# Patient Record
Sex: Female | Born: 1965 | Hispanic: Yes | Marital: Single | Smoking: Never smoker
Health system: Southern US, Community
[De-identification: ages and names within clinical notes are randomized; demographics above are authoritative.]

## PROBLEM LIST (undated history)

## (undated) ENCOUNTER — Emergency Department (HOSPITAL_COMMUNITY): Admission: EM

## (undated) ENCOUNTER — Ambulatory Visit (HOSPITAL_COMMUNITY): Source: Home / Self Care

## (undated) DIAGNOSIS — M329 Systemic lupus erythematosus, unspecified: Secondary | ICD-10-CM

## (undated) DIAGNOSIS — M1990 Unspecified osteoarthritis, unspecified site: Secondary | ICD-10-CM

## (undated) DIAGNOSIS — Z973 Presence of spectacles and contact lenses: Secondary | ICD-10-CM

## (undated) DIAGNOSIS — K219 Gastro-esophageal reflux disease without esophagitis: Secondary | ICD-10-CM

## (undated) DIAGNOSIS — N393 Stress incontinence (female) (male): Secondary | ICD-10-CM

## (undated) DIAGNOSIS — M3500 Sicca syndrome, unspecified: Secondary | ICD-10-CM

## (undated) DIAGNOSIS — M069 Rheumatoid arthritis, unspecified: Secondary | ICD-10-CM

## (undated) DIAGNOSIS — H04123 Dry eye syndrome of bilateral lacrimal glands: Secondary | ICD-10-CM

## (undated) DIAGNOSIS — R7611 Nonspecific reaction to tuberculin skin test without active tuberculosis: Secondary | ICD-10-CM

## (undated) DIAGNOSIS — Z8669 Personal history of other diseases of the nervous system and sense organs: Secondary | ICD-10-CM

## (undated) HISTORY — DX: Sjogren syndrome, unspecified: M35.00

## (undated) HISTORY — DX: Systemic lupus erythematosus, unspecified: M32.9

## (undated) HISTORY — DX: Rheumatoid arthritis, unspecified: M06.9

## (undated) HISTORY — DX: Unspecified osteoarthritis, unspecified site: M19.90

---

## 1991-01-23 HISTORY — PX: TYMPANOPLASTY: SHX33

## 1992-01-23 HISTORY — PX: CERVICAL CONIZATION W/BX: SHX1330

## 2004-10-13 ENCOUNTER — Emergency Department (HOSPITAL_COMMUNITY): Admission: EM | Admit: 2004-10-13 | Discharge: 2004-10-13 | Payer: Self-pay | Admitting: Emergency Medicine

## 2004-10-21 ENCOUNTER — Emergency Department (HOSPITAL_COMMUNITY): Admission: EM | Admit: 2004-10-21 | Discharge: 2004-10-21 | Payer: Self-pay | Admitting: Emergency Medicine

## 2004-10-29 ENCOUNTER — Encounter: Admission: RE | Admit: 2004-10-29 | Discharge: 2004-10-29 | Payer: Self-pay | Admitting: *Deleted

## 2005-01-22 HISTORY — PX: CARPAL TUNNEL RELEASE: SHX101

## 2005-01-22 HISTORY — PX: GLAUCOMA SURGERY: SHX656

## 2005-02-05 ENCOUNTER — Encounter: Payer: Self-pay | Admitting: Family Medicine

## 2005-02-05 LAB — CONVERTED CEMR LAB: HCV Ab: NEGATIVE

## 2005-02-19 ENCOUNTER — Emergency Department (HOSPITAL_COMMUNITY): Admission: EM | Admit: 2005-02-19 | Discharge: 2005-02-19 | Payer: Self-pay | Admitting: Emergency Medicine

## 2005-05-14 ENCOUNTER — Encounter: Admission: RE | Admit: 2005-05-14 | Discharge: 2005-08-12 | Payer: Self-pay | Admitting: Orthopedic Surgery

## 2005-08-13 ENCOUNTER — Encounter: Admission: RE | Admit: 2005-08-13 | Discharge: 2005-11-11 | Payer: Self-pay | Admitting: Orthopedic Surgery

## 2006-01-17 ENCOUNTER — Encounter: Admission: RE | Admit: 2006-01-17 | Discharge: 2006-01-17 | Payer: Self-pay | Admitting: Family Medicine

## 2006-05-12 ENCOUNTER — Emergency Department (HOSPITAL_COMMUNITY): Admission: EM | Admit: 2006-05-12 | Discharge: 2006-05-13 | Payer: Self-pay | Admitting: Emergency Medicine

## 2007-01-14 ENCOUNTER — Ambulatory Visit: Payer: Self-pay | Admitting: Sports Medicine

## 2007-01-23 DIAGNOSIS — D649 Anemia, unspecified: Secondary | ICD-10-CM

## 2007-01-23 HISTORY — DX: Anemia, unspecified: D64.9

## 2007-01-23 HISTORY — PX: TRIGGER FINGER RELEASE: SHX641

## 2007-01-27 ENCOUNTER — Ambulatory Visit: Payer: Self-pay | Admitting: Family Medicine

## 2007-01-27 LAB — CONVERTED CEMR LAB: Rapid Strep: POSITIVE

## 2007-02-20 ENCOUNTER — Encounter: Admission: RE | Admit: 2007-02-20 | Discharge: 2007-02-20 | Payer: Self-pay | Admitting: Family Medicine

## 2007-02-24 ENCOUNTER — Encounter (INDEPENDENT_AMBULATORY_CARE_PROVIDER_SITE_OTHER): Payer: Self-pay | Admitting: Family Medicine

## 2007-02-27 ENCOUNTER — Ambulatory Visit: Payer: Self-pay | Admitting: Family Medicine

## 2007-02-27 LAB — CONVERTED CEMR LAB: Rapid Strep: NEGATIVE

## 2007-04-09 ENCOUNTER — Ambulatory Visit: Payer: Self-pay | Admitting: Family Medicine

## 2007-04-09 DIAGNOSIS — M069 Rheumatoid arthritis, unspecified: Secondary | ICD-10-CM

## 2007-04-10 ENCOUNTER — Ambulatory Visit: Payer: Self-pay | Admitting: Family Medicine

## 2007-04-11 ENCOUNTER — Ambulatory Visit: Payer: Self-pay | Admitting: Family Medicine

## 2007-04-11 ENCOUNTER — Encounter (INDEPENDENT_AMBULATORY_CARE_PROVIDER_SITE_OTHER): Payer: Self-pay | Admitting: Family Medicine

## 2007-04-17 LAB — CONVERTED CEMR LAB
Cholesterol: 171 mg/dL (ref 0–200)
HDL: 67 mg/dL (ref >39)
Total CHOL/HDL Ratio: 2.6
Triglycerides: 76 mg/dL (ref <150)

## 2007-04-21 ENCOUNTER — Encounter (INDEPENDENT_AMBULATORY_CARE_PROVIDER_SITE_OTHER): Payer: Self-pay | Admitting: Family Medicine

## 2007-04-29 ENCOUNTER — Encounter (INDEPENDENT_AMBULATORY_CARE_PROVIDER_SITE_OTHER): Payer: Self-pay | Admitting: Family Medicine

## 2007-04-29 ENCOUNTER — Ambulatory Visit: Payer: Self-pay | Admitting: Family Medicine

## 2007-05-19 ENCOUNTER — Ambulatory Visit: Payer: Self-pay | Admitting: Family Medicine

## 2007-05-19 ENCOUNTER — Encounter (INDEPENDENT_AMBULATORY_CARE_PROVIDER_SITE_OTHER): Payer: Self-pay | Admitting: Family Medicine

## 2007-05-20 ENCOUNTER — Encounter (INDEPENDENT_AMBULATORY_CARE_PROVIDER_SITE_OTHER): Payer: Self-pay | Admitting: Family Medicine

## 2007-05-21 ENCOUNTER — Encounter: Admission: RE | Admit: 2007-05-21 | Discharge: 2007-05-21 | Payer: Self-pay | Admitting: Internal Medicine

## 2007-05-29 ENCOUNTER — Encounter: Admission: RE | Admit: 2007-05-29 | Discharge: 2007-05-29 | Payer: Self-pay | Admitting: Gastroenterology

## 2007-06-23 ENCOUNTER — Ambulatory Visit: Payer: Self-pay | Admitting: Family Medicine

## 2007-06-25 ENCOUNTER — Encounter (INDEPENDENT_AMBULATORY_CARE_PROVIDER_SITE_OTHER): Payer: Self-pay | Admitting: Family Medicine

## 2007-07-27 ENCOUNTER — Emergency Department (HOSPITAL_COMMUNITY): Admission: EM | Admit: 2007-07-27 | Discharge: 2007-07-27 | Payer: Self-pay | Admitting: Family Medicine

## 2007-08-08 ENCOUNTER — Encounter (INDEPENDENT_AMBULATORY_CARE_PROVIDER_SITE_OTHER): Payer: Self-pay | Admitting: Family Medicine

## 2007-08-15 ENCOUNTER — Encounter (INDEPENDENT_AMBULATORY_CARE_PROVIDER_SITE_OTHER): Payer: Self-pay | Admitting: Family Medicine

## 2007-10-17 ENCOUNTER — Ambulatory Visit: Payer: Self-pay | Admitting: Family Medicine

## 2007-10-17 ENCOUNTER — Encounter (INDEPENDENT_AMBULATORY_CARE_PROVIDER_SITE_OTHER): Payer: Self-pay | Admitting: Family Medicine

## 2007-10-23 ENCOUNTER — Telehealth (INDEPENDENT_AMBULATORY_CARE_PROVIDER_SITE_OTHER): Payer: Self-pay | Admitting: Family Medicine

## 2007-10-31 ENCOUNTER — Ambulatory Visit: Payer: Self-pay | Admitting: Family Medicine

## 2007-11-06 ENCOUNTER — Telehealth (INDEPENDENT_AMBULATORY_CARE_PROVIDER_SITE_OTHER): Payer: Self-pay | Admitting: Family Medicine

## 2007-11-07 ENCOUNTER — Telehealth: Payer: Self-pay | Admitting: *Deleted

## 2007-11-27 ENCOUNTER — Ambulatory Visit: Payer: Self-pay | Admitting: Family Medicine

## 2007-11-27 ENCOUNTER — Telehealth: Payer: Self-pay | Admitting: *Deleted

## 2007-12-04 ENCOUNTER — Encounter: Admission: RE | Admit: 2007-12-04 | Discharge: 2008-01-22 | Payer: Self-pay | Admitting: Orthopedic Surgery

## 2008-01-06 ENCOUNTER — Ambulatory Visit: Payer: Self-pay | Admitting: Family Medicine

## 2008-01-06 DIAGNOSIS — M3500 Sicca syndrome, unspecified: Secondary | ICD-10-CM | POA: Insufficient documentation

## 2008-03-09 ENCOUNTER — Encounter (INDEPENDENT_AMBULATORY_CARE_PROVIDER_SITE_OTHER): Payer: Self-pay | Admitting: Family Medicine

## 2008-03-09 ENCOUNTER — Ambulatory Visit: Payer: Self-pay | Admitting: Family Medicine

## 2008-03-09 DIAGNOSIS — R799 Abnormal finding of blood chemistry, unspecified: Secondary | ICD-10-CM | POA: Insufficient documentation

## 2008-03-10 LAB — CONVERTED CEMR LAB
Albumin: 4.6 g/dL (ref 3.5–5.2)
BUN: 15 mg/dL (ref 6–23)
CO2: 20 meq/L (ref 19–32)
Chloride: 106 meq/L (ref 96–112)
Creatinine, Ser: 0.65 mg/dL (ref 0.40–1.20)
Glucose, Bld: 121 mg/dL — ABNORMAL HIGH (ref 70–99)
Lymphocytes Relative: 33 % (ref 12–46)
Lymphs Abs: 2.1 10*3/uL (ref 0.7–4.0)
MCV: 80 fL (ref 78.0–100.0)
Monocytes Absolute: 0.6 10*3/uL (ref 0.1–1.0)
Neutro Abs: 3.8 10*3/uL (ref 1.7–7.7)
Neutrophils Relative %: 58 % (ref 43–77)
Platelets: 212 10*3/uL (ref 150–400)
RBC: 4.36 M/uL (ref 3.87–5.11)
RDW: 15.4 % (ref 11.5–15.5)
Sodium: 138 meq/L (ref 135–145)
Total Bilirubin: 0.2 mg/dL — ABNORMAL LOW (ref 0.3–1.2)

## 2008-03-12 ENCOUNTER — Encounter: Admission: RE | Admit: 2008-03-12 | Discharge: 2008-03-12 | Payer: Self-pay | Admitting: Family Medicine

## 2008-04-06 ENCOUNTER — Other Ambulatory Visit: Admission: RE | Admit: 2008-04-06 | Discharge: 2008-04-06 | Payer: Self-pay | Admitting: Family Medicine

## 2008-04-06 ENCOUNTER — Encounter (INDEPENDENT_AMBULATORY_CARE_PROVIDER_SITE_OTHER): Payer: Self-pay | Admitting: Family Medicine

## 2008-04-06 ENCOUNTER — Ambulatory Visit: Payer: Self-pay | Admitting: Family Medicine

## 2008-04-12 LAB — CONVERTED CEMR LAB
ALT: 20 units/L (ref 0–35)
AST: 18 units/L (ref 0–37)
Albumin: 4.3 g/dL (ref 3.5–5.2)
Alkaline Phosphatase: 69 units/L (ref 39–117)
BUN: 15 mg/dL (ref 6–23)
Basophils Absolute: 0 10*3/uL (ref 0.0–0.1)
Basophils Relative: 1 % (ref 0–1)
Chloride: 108 meq/L (ref 96–112)
Creatinine, Ser: 0.63 mg/dL (ref 0.40–1.20)
Eosinophils Absolute: 0.1 10*3/uL (ref 0.0–0.7)
Hemoglobin: 10.5 g/dL — ABNORMAL LOW (ref 12.0–15.0)
Lymphs Abs: 1.6 10*3/uL (ref 0.7–4.0)
MCV: 79.7 fL (ref 78.0–100.0)
Neutrophils Relative %: 58 % (ref 43–77)
Potassium: 4.5 meq/L (ref 3.5–5.3)
RBC: 4.28 M/uL (ref 3.87–5.11)
RDW: 15.9 % — ABNORMAL HIGH (ref 11.5–15.5)
Total Protein: 7.4 g/dL (ref 6.0–8.3)
WBC: 4.9 10*3/uL (ref 4.0–10.5)

## 2008-05-05 ENCOUNTER — Encounter (INDEPENDENT_AMBULATORY_CARE_PROVIDER_SITE_OTHER): Payer: Self-pay | Admitting: Family Medicine

## 2008-05-05 ENCOUNTER — Ambulatory Visit: Payer: Self-pay | Admitting: Family Medicine

## 2008-05-05 DIAGNOSIS — K0510 Chronic gingivitis, plaque induced: Secondary | ICD-10-CM | POA: Insufficient documentation

## 2008-05-07 LAB — CONVERTED CEMR LAB
MCHC: 31.8 g/dL (ref 30.0–36.0)
MCV: 79.6 fL (ref 78.0–100.0)
Platelets: 208 10*3/uL (ref 150–400)
RBC: 4.07 M/uL (ref 3.87–5.11)
WBC: 5.3 10*3/uL (ref 4.0–10.5)

## 2008-06-07 ENCOUNTER — Encounter (INDEPENDENT_AMBULATORY_CARE_PROVIDER_SITE_OTHER): Payer: Self-pay | Admitting: Family Medicine

## 2008-06-07 ENCOUNTER — Ambulatory Visit: Payer: Self-pay | Admitting: Family Medicine

## 2008-06-07 ENCOUNTER — Ambulatory Visit (HOSPITAL_COMMUNITY): Admission: RE | Admit: 2008-06-07 | Discharge: 2008-06-07 | Payer: Self-pay | Admitting: Family Medicine

## 2008-06-07 ENCOUNTER — Encounter: Payer: Self-pay | Admitting: *Deleted

## 2008-06-07 DIAGNOSIS — K055 Other periodontal diseases: Secondary | ICD-10-CM

## 2008-06-08 LAB — CONVERTED CEMR LAB
AST: 15 units/L (ref 0–37)
Calcium: 8.6 mg/dL (ref 8.4–10.5)
Chloride: 111 meq/L (ref 96–112)
Ferritin: 2 ng/mL — ABNORMAL LOW (ref 10–291)
INR: 1 (ref 0.0–1.5)
Iron: 19 ug/dL — ABNORMAL LOW (ref 42–145)
Potassium: 4.3 meq/L (ref 3.5–5.3)
Prothrombin Time: 13.4 s (ref 11.6–15.2)
RBC: 3.82 M/uL — ABNORMAL LOW (ref 3.87–5.11)
Saturation Ratios: 5 % — ABNORMAL LOW (ref 20–55)
Sodium: 141 meq/L (ref 135–145)
TIBC: 391 ug/dL (ref 250–470)
TSH: 1.639 microintl units/mL (ref 0.350–4.500)
UIBC: 372 ug/dL

## 2008-06-09 ENCOUNTER — Telehealth: Payer: Self-pay | Admitting: *Deleted

## 2008-07-07 ENCOUNTER — Ambulatory Visit: Payer: Self-pay | Admitting: Family Medicine

## 2008-07-07 ENCOUNTER — Encounter (INDEPENDENT_AMBULATORY_CARE_PROVIDER_SITE_OTHER): Payer: Self-pay | Admitting: Family Medicine

## 2008-07-08 LAB — CONVERTED CEMR LAB
BUN: 12 mg/dL (ref 6–23)
CO2: 23 meq/L (ref 19–32)
Calcium: 8.8 mg/dL (ref 8.4–10.5)
Chloride: 108 meq/L (ref 96–112)
Creatinine, Ser: 0.68 mg/dL (ref 0.40–1.20)
Glucose, Bld: 84 mg/dL (ref 70–99)
HCT: 36.8 % (ref 36.0–46.0)
Hemoglobin: 11.1 g/dL — ABNORMAL LOW (ref 12.0–15.0)
MCHC: 30.2 g/dL (ref 30.0–36.0)
Platelets: 191 10*3/uL (ref 150–400)
RBC: 4.29 M/uL (ref 3.87–5.11)
WBC: 4.6 10*3/uL (ref 4.0–10.5)

## 2008-07-27 ENCOUNTER — Telehealth: Payer: Self-pay | Admitting: *Deleted

## 2008-07-27 ENCOUNTER — Encounter: Payer: Self-pay | Admitting: Family Medicine

## 2008-08-03 ENCOUNTER — Ambulatory Visit: Payer: Self-pay | Admitting: Family Medicine

## 2008-08-03 ENCOUNTER — Ambulatory Visit (HOSPITAL_COMMUNITY): Admission: RE | Admit: 2008-08-03 | Discharge: 2008-08-03 | Payer: Self-pay | Admitting: Family Medicine

## 2008-08-03 ENCOUNTER — Encounter: Payer: Self-pay | Admitting: Family Medicine

## 2008-08-03 LAB — CONVERTED CEMR LAB
BUN: 16 mg/dL (ref 6–23)
CO2: 24 meq/L (ref 19–32)
Calcium: 9 mg/dL (ref 8.4–10.5)
Glucose, Bld: 89 mg/dL (ref 70–99)
MCHC: 32.8 g/dL (ref 30.0–36.0)
Potassium: 4.3 meq/L (ref 3.5–5.3)
WBC: 5.9 10*3/uL (ref 4.0–10.5)

## 2008-08-31 ENCOUNTER — Encounter: Admission: RE | Admit: 2008-08-31 | Discharge: 2008-10-21 | Payer: Self-pay | Admitting: Family Medicine

## 2008-09-06 ENCOUNTER — Encounter: Payer: Self-pay | Admitting: Family Medicine

## 2008-09-06 ENCOUNTER — Ambulatory Visit: Payer: Self-pay | Admitting: Family Medicine

## 2008-09-06 LAB — CONVERTED CEMR LAB
Albumin: 4.2 g/dL (ref 3.5–5.2)
Alkaline Phosphatase: 51 units/L (ref 39–117)
BUN: 15 mg/dL (ref 6–23)
Basophils Absolute: 0 10*3/uL (ref 0.0–0.1)
CO2: 19 meq/L (ref 19–32)
Calcium: 8.9 mg/dL (ref 8.4–10.5)
Chloride: 110 meq/L (ref 96–112)
Eosinophils Absolute: 0 10*3/uL (ref 0.0–0.7)
Glucose, Bld: 87 mg/dL (ref 70–99)
HCT: 39.7 % (ref 36.0–46.0)
Lymphs Abs: 1.8 10*3/uL (ref 0.7–4.0)
Neutro Abs: 2.9 10*3/uL (ref 1.7–7.7)
Neutrophils Relative %: 56 % (ref 43–77)
Platelets: 186 10*3/uL (ref 150–400)
Potassium: 4.6 meq/L (ref 3.5–5.3)
Sodium: 139 meq/L (ref 135–145)

## 2008-09-08 ENCOUNTER — Ambulatory Visit: Payer: Self-pay | Admitting: Family Medicine

## 2008-09-08 ENCOUNTER — Encounter: Payer: Self-pay | Admitting: Family Medicine

## 2008-09-08 LAB — CONVERTED CEMR LAB: Rapid Strep: NEGATIVE

## 2008-09-10 LAB — CONVERTED CEMR LAB: Streptococcus, Group A Screen (Direct): NEGATIVE

## 2008-10-07 ENCOUNTER — Ambulatory Visit: Payer: Self-pay | Admitting: Family Medicine

## 2008-10-07 ENCOUNTER — Encounter: Payer: Self-pay | Admitting: Family Medicine

## 2008-10-07 LAB — CONVERTED CEMR LAB
Eosinophils Absolute: 0.1 10*3/uL (ref 0.0–0.7)
Eosinophils Relative: 1 % (ref 0–5)
GC Probe Amp, Genital: NEGATIVE
HCT: 40.3 % (ref 36.0–46.0)
MCHC: 32.5 g/dL (ref 30.0–36.0)
MCV: 90.2 fL (ref 78.0–100.0)
Neutro Abs: 7.1 10*3/uL (ref 1.7–7.7)
Neutrophils Relative %: 69 % (ref 43–77)
Nitrite: NEGATIVE
Specific Gravity, Urine: 1.01
WBC Urine, dipstick: NEGATIVE

## 2008-10-22 ENCOUNTER — Ambulatory Visit: Payer: Self-pay | Admitting: Family Medicine

## 2008-11-05 ENCOUNTER — Ambulatory Visit: Payer: Self-pay | Admitting: Family Medicine

## 2008-11-05 ENCOUNTER — Encounter: Payer: Self-pay | Admitting: Family Medicine

## 2008-11-05 LAB — CONVERTED CEMR LAB
Alkaline Phosphatase: 50 units/L (ref 39–117)
Basophils Absolute: 0 10*3/uL (ref 0.0–0.1)
CO2: 22 meq/L (ref 19–32)
Creatinine, Ser: 0.68 mg/dL (ref 0.40–1.20)
Monocytes Absolute: 0.6 10*3/uL (ref 0.1–1.0)
Monocytes Relative: 9 % (ref 3–12)
Neutro Abs: 3.8 10*3/uL (ref 1.7–7.7)
Platelets: 194 10*3/uL (ref 150–400)
Potassium: 4.5 meq/L (ref 3.5–5.3)
RBC: 4.33 M/uL (ref 3.87–5.11)
Total Bilirubin: 0.4 mg/dL (ref 0.3–1.2)
Total Protein: 7.7 g/dL (ref 6.0–8.3)
WBC: 6.7 10*3/uL (ref 4.0–10.5)

## 2008-11-06 ENCOUNTER — Encounter: Payer: Self-pay | Admitting: Family Medicine

## 2008-12-20 ENCOUNTER — Telehealth: Payer: Self-pay | Admitting: *Deleted

## 2008-12-20 ENCOUNTER — Encounter: Payer: Self-pay | Admitting: Family Medicine

## 2009-01-06 ENCOUNTER — Ambulatory Visit: Payer: Self-pay | Admitting: Family Medicine

## 2009-01-06 ENCOUNTER — Encounter: Payer: Self-pay | Admitting: Family Medicine

## 2009-01-10 LAB — CONVERTED CEMR LAB
ALT: 16 units/L (ref 0–35)
AST: 18 units/L (ref 0–37)
Alkaline Phosphatase: 66 units/L (ref 39–117)
Basophils Absolute: 0 10*3/uL (ref 0.0–0.1)
Basophils Relative: 0 % (ref 0–1)
Calcium: 8.8 mg/dL (ref 8.4–10.5)
Eosinophils Relative: 1 % (ref 0–5)
HCT: 38.4 % (ref 36.0–46.0)
Hemoglobin: 13.2 g/dL (ref 12.0–15.0)
MCHC: 34.4 g/dL (ref 30.0–36.0)
MCV: 88.1 fL (ref 78.0–100.0)
Monocytes Absolute: 0.6 10*3/uL (ref 0.1–1.0)
Monocytes Relative: 9 % (ref 3–12)
Neutrophils Relative %: 62 % (ref 43–77)
RBC: 4.36 M/uL (ref 3.87–5.11)
Total Protein: 7.3 g/dL (ref 6.0–8.3)

## 2009-01-28 ENCOUNTER — Encounter: Payer: Self-pay | Admitting: Family Medicine

## 2009-03-10 ENCOUNTER — Telehealth: Payer: Self-pay | Admitting: Family Medicine

## 2009-03-15 ENCOUNTER — Encounter: Payer: Self-pay | Admitting: Family Medicine

## 2009-03-15 ENCOUNTER — Ambulatory Visit: Payer: Self-pay | Admitting: Family Medicine

## 2009-03-18 LAB — CONVERTED CEMR LAB
ALT: 13 units/L (ref 0–35)
CO2: 23 meq/L (ref 19–32)
Calcium: 8.9 mg/dL (ref 8.4–10.5)
Chloride: 107 meq/L (ref 96–112)
Glucose, Bld: 74 mg/dL (ref 70–99)
HCT: 38.5 % (ref 36.0–46.0)
MCV: 87.1 fL (ref 78.0–100.0)
Platelets: 197 10*3/uL (ref 150–400)
RBC: 4.42 M/uL (ref 3.87–5.11)
Sodium: 139 meq/L (ref 135–145)
Total Bilirubin: 0.3 mg/dL (ref 0.3–1.2)
Total Protein: 7.2 g/dL (ref 6.0–8.3)
WBC: 5.6 10*3/uL (ref 4.0–10.5)

## 2009-05-25 ENCOUNTER — Ambulatory Visit: Payer: Self-pay | Admitting: Family Medicine

## 2009-05-25 ENCOUNTER — Encounter: Payer: Self-pay | Admitting: Family Medicine

## 2009-05-25 LAB — CONVERTED CEMR LAB
ALT: 18 units/L (ref 0–35)
AST: 21 units/L (ref 0–37)
CO2: 22 meq/L (ref 19–32)
Calcium: 9.5 mg/dL (ref 8.4–10.5)
Chloride: 104 meq/L (ref 96–112)
Creatinine, Ser: 0.76 mg/dL (ref 0.40–1.20)
Lymphocytes Relative: 31 % (ref 12–46)
Lymphs Abs: 2.6 10*3/uL (ref 0.7–4.0)
Monocytes Relative: 6 % (ref 3–12)
Neutro Abs: 5 10*3/uL (ref 1.7–7.7)
Neutrophils Relative %: 61 % (ref 43–77)
Platelets: 234 10*3/uL (ref 150–400)
Potassium: 4.3 meq/L (ref 3.5–5.3)
RBC: 4.71 M/uL (ref 3.87–5.11)
Sodium: 138 meq/L (ref 135–145)
Total Protein: 7.9 g/dL (ref 6.0–8.3)
WBC: 8.2 10*3/uL (ref 4.0–10.5)

## 2009-05-26 ENCOUNTER — Encounter: Payer: Self-pay | Admitting: Family Medicine

## 2009-06-06 ENCOUNTER — Telehealth: Payer: Self-pay | Admitting: *Deleted

## 2009-07-14 ENCOUNTER — Encounter: Payer: Self-pay | Admitting: Family Medicine

## 2009-08-23 ENCOUNTER — Ambulatory Visit: Payer: Self-pay | Admitting: Family Medicine

## 2009-08-23 DIAGNOSIS — R300 Dysuria: Secondary | ICD-10-CM

## 2009-08-23 LAB — CONVERTED CEMR LAB
Bilirubin Urine: NEGATIVE
Blood in Urine, dipstick: NEGATIVE
Glucose, Urine, Semiquant: NEGATIVE
Nitrite: POSITIVE
Specific Gravity, Urine: 1.015

## 2009-09-07 ENCOUNTER — Encounter: Payer: Self-pay | Admitting: Family Medicine

## 2009-09-07 ENCOUNTER — Other Ambulatory Visit: Admission: RE | Admit: 2009-09-07 | Discharge: 2009-09-07 | Payer: Self-pay | Admitting: Family Medicine

## 2009-09-07 ENCOUNTER — Ambulatory Visit: Payer: Self-pay | Admitting: Family Medicine

## 2009-09-07 LAB — CONVERTED CEMR LAB
Bilirubin Urine: NEGATIVE
Chlamydia, DNA Probe: NEGATIVE
GC Probe Amp, Genital: NEGATIVE
Glucose, Urine, Semiquant: NEGATIVE
Ketones, urine, test strip: NEGATIVE
Protein, U semiquant: NEGATIVE
pH: 6.5

## 2009-09-08 ENCOUNTER — Encounter: Payer: Self-pay | Admitting: Family Medicine

## 2009-09-08 ENCOUNTER — Telehealth: Payer: Self-pay | Admitting: Family Medicine

## 2009-09-08 DIAGNOSIS — D509 Iron deficiency anemia, unspecified: Secondary | ICD-10-CM | POA: Insufficient documentation

## 2009-09-08 LAB — CONVERTED CEMR LAB
Albumin: 4.1 g/dL (ref 3.5–5.2)
CO2: 25 meq/L (ref 19–32)
Calcium: 9 mg/dL (ref 8.4–10.5)
Chloride: 105 meq/L (ref 96–112)
Glucose, Bld: 123 mg/dL — ABNORMAL HIGH (ref 70–99)
Lymphocytes Relative: 27 % (ref 12–46)
Lymphs Abs: 1.7 10*3/uL (ref 0.7–4.0)
Neutrophils Relative %: 61 % (ref 43–77)
Platelets: 217 10*3/uL (ref 150–400)
Potassium: 4.1 meq/L (ref 3.5–5.3)
RBC: 4.25 M/uL (ref 3.87–5.11)
Sodium: 140 meq/L (ref 135–145)
Total Protein: 7.1 g/dL (ref 6.0–8.3)
WBC: 6.3 10*3/uL (ref 4.0–10.5)

## 2009-09-09 ENCOUNTER — Telehealth: Payer: Self-pay | Admitting: *Deleted

## 2009-09-12 ENCOUNTER — Encounter: Payer: Self-pay | Admitting: Family Medicine

## 2009-09-15 ENCOUNTER — Emergency Department (HOSPITAL_COMMUNITY): Admission: EM | Admit: 2009-09-15 | Discharge: 2009-09-15 | Payer: Self-pay | Admitting: Emergency Medicine

## 2009-10-03 ENCOUNTER — Telehealth: Payer: Self-pay | Admitting: Family Medicine

## 2009-10-04 ENCOUNTER — Telehealth (INDEPENDENT_AMBULATORY_CARE_PROVIDER_SITE_OTHER): Payer: Self-pay | Admitting: *Deleted

## 2009-10-10 ENCOUNTER — Telehealth: Payer: Self-pay | Admitting: Family Medicine

## 2009-10-10 ENCOUNTER — Encounter: Payer: Self-pay | Admitting: Family Medicine

## 2009-10-10 ENCOUNTER — Ambulatory Visit: Payer: Self-pay | Admitting: Family Medicine

## 2009-10-11 ENCOUNTER — Telehealth: Payer: Self-pay | Admitting: *Deleted

## 2009-10-11 LAB — CONVERTED CEMR LAB
Basophils Absolute: 0 10*3/uL (ref 0.0–0.1)
Basophils Relative: 0 % (ref 0–1)
Hemoglobin: 12.4 g/dL (ref 12.0–15.0)
MCHC: 31.1 g/dL (ref 30.0–36.0)
Monocytes Absolute: 0.6 10*3/uL (ref 0.1–1.0)
Neutro Abs: 5.9 10*3/uL (ref 1.7–7.7)
Neutrophils Relative %: 72 % (ref 43–77)
RDW: 17.4 % — ABNORMAL HIGH (ref 11.5–15.5)

## 2009-10-12 ENCOUNTER — Ambulatory Visit (HOSPITAL_COMMUNITY): Admission: RE | Admit: 2009-10-12 | Discharge: 2009-10-12 | Payer: Self-pay | Admitting: Family Medicine

## 2009-10-24 ENCOUNTER — Encounter: Payer: Self-pay | Admitting: Family Medicine

## 2009-10-24 ENCOUNTER — Ambulatory Visit: Payer: Self-pay | Admitting: Family Medicine

## 2009-10-24 LAB — CONVERTED CEMR LAB
Ketones, urine, test strip: NEGATIVE
Nitrite: NEGATIVE
Specific Gravity, Urine: 1.02
WBC Urine, dipstick: NEGATIVE

## 2009-10-25 ENCOUNTER — Telehealth: Payer: Self-pay | Admitting: *Deleted

## 2009-10-25 ENCOUNTER — Encounter: Payer: Self-pay | Admitting: Family Medicine

## 2009-11-08 ENCOUNTER — Encounter: Payer: Self-pay | Admitting: Family Medicine

## 2009-11-10 ENCOUNTER — Ambulatory Visit: Payer: Self-pay | Admitting: Family Medicine

## 2009-11-10 ENCOUNTER — Encounter: Payer: Self-pay | Admitting: Family Medicine

## 2009-11-10 LAB — CONVERTED CEMR LAB
Bilirubin Urine: NEGATIVE
Blood in Urine, dipstick: NEGATIVE
Chlamydia, DNA Probe: NEGATIVE
Epithelial cells, urine: <5 /lpf
GC Probe Amp, Genital: NEGATIVE
Glucose, Urine, Semiquant: NEGATIVE
Ketones, urine, test strip: NEGATIVE
Nitrite: NEGATIVE
Protein, U semiquant: NEGATIVE
Specific Gravity, Urine: 1.01
Urobilinogen, UA: 0.2
WBC, UA: <5 cells/hpf
Whiff Test: NEGATIVE
pH: 6.5

## 2009-11-11 ENCOUNTER — Encounter: Payer: Self-pay | Admitting: Family Medicine

## 2009-11-14 ENCOUNTER — Encounter: Payer: Self-pay | Admitting: Family Medicine

## 2009-11-14 ENCOUNTER — Telehealth: Payer: Self-pay | Admitting: *Deleted

## 2009-11-14 LAB — CONVERTED CEMR LAB
Bilirubin Urine: NEGATIVE
Blood in Urine, dipstick: NEGATIVE
Glucose, Urine, Semiquant: NEGATIVE
Ketones, urine, test strip: NEGATIVE
Nitrite: NEGATIVE
Protein, U semiquant: NEGATIVE
Specific Gravity, Urine: <1.005
Urobilinogen, UA: 0.2
WBC Urine, dipstick: NEGATIVE
pH: 6.5

## 2009-11-15 ENCOUNTER — Encounter: Payer: Self-pay | Admitting: Family Medicine

## 2009-11-16 ENCOUNTER — Telehealth: Payer: Self-pay | Admitting: *Deleted

## 2010-01-18 ENCOUNTER — Encounter: Payer: Self-pay | Admitting: Family Medicine

## 2010-01-22 HISTORY — PX: SHOULDER ARTHROSCOPY WITH OPEN ROTATOR CUFF REPAIR: SHX6092

## 2010-02-13 ENCOUNTER — Encounter: Payer: Self-pay | Admitting: Family Medicine

## 2010-02-16 ENCOUNTER — Ambulatory Visit (HOSPITAL_COMMUNITY)
Admission: RE | Admit: 2010-02-16 | Discharge: 2010-02-16 | Payer: Self-pay | Source: Home / Self Care | Attending: Obstetrics | Admitting: Obstetrics

## 2010-02-21 NOTE — Miscellaneous (Signed)
Summary: ROI  ROI   Imported By: De Nurse 10/27/2009 16:19:02  _____________________________________________________________________  External Attachment:    Type:   Image     Comment:   External Document

## 2010-02-21 NOTE — Miscellaneous (Signed)
Summary: cxl appt at [facility]  Clinical Lists Changes pt cancelled her appt at [facility] that was to be 01/27/09.[place] RN  January 28, 2009 8:36 AM   THanks. Eustaquio Boyden  MD  January 28, 2009 8:45 AM   Appended Document: cxl appt at [facility] was having difficutly affording referral.  Says if has more irregular bleeding will seek care of GYN she has worked with in past for endometrial biospy.  Understands risks of waiting but finances major issue.

## 2010-02-21 NOTE — Progress Notes (Signed)
Summary: re: urine cx/ts  ---- Converted from flag ---- ---- 09/09/2009 2:23 PM, Milinda Antis MD wrote: please let pt know her urine culture was negative, no infection ------------------------------  called pt and informed

## 2010-02-21 NOTE — Assessment & Plan Note (Signed)
 Summary: flu symptoms, on immunosuppressants, chest burning, cough/ls   Vital Signs:  Patient Profile:   45 Years Old Female Height:     63.5 inches Weight:      153.2 pounds BMI:     26.81 Temp:     98.2 degrees F oral Pulse rate:   90 / minute BP sitting:   135 / 73  (left arm) Cuff size:   regular  Vitals Entered By: KATIE MULBERRY LPN (November 27, 2007 3:55 PM)                 PCP:  [place]  Chief Complaint:  body aches, weakness, fever, cough, congestion, and fatigue.  History of Present Illness: One day hx chills, muscle and back aches, congestion, runny nose, sore throat, cough, malaise.  Hx exposure to sons who tested H1N1 positive.  Taking humira for RA.  No HA, N/V/D/C, abd pain, CP, SOB.    Past Medical History:    Reviewed history from 02/27/2007 and no changes required:       RA       Tympanoplasty several years ago.   Family History:    Reviewed history from 04/09/2007 and no changes required:       Gastric cancer: father and brother       Liver Ca:  maternal GF       Mom: gallbladder disease  Social History:    Reviewed history from 04/09/2007 and no changes required:       works as an equities trader for [employer]       No tobacco use, no ETOH use, no illicit drug use.  Has 2 daughters ages 76,9.       No current sexual partner    Review of Systems       See HPI   Physical Exam  General:     Well-developed,well-nourished,in no acute distress; alert,appropriate and cooperative throughout examination Head:     Normocephalic and atraumatic without obvious abnormalities.  Eyes:     No corneal or conjunctival inflammation noted. EOMI. Perrla.  Ears:     External ear exam shows no significant lesions or deformities.  Nose:     External nasal examination shows no deformity or inflammation.  Mouth:     Oral mucosa and oropharynx without lesions or exudates.   Neck:     Cervical lymphadenopathy present Chest Wall:     Tender to palpation at  costal margins. Lungs:     Normal respiratory effort, chest expands symmetrically. Lungs are clear to auscultation, no crackles or wheezes. Heart:     Normal rate and regular rhythm. S1 and S2 normal without gallop, murmur, click, rub or other extra sounds. Abdomen:     Bowel sounds positive,abdomen soft and non-tender without masses, organomegaly or hernias noted. Skin:     No rashes    Impression & Recommendations:  Problem # 1:  INFLUENZA (ICD-487.8) This is an influenza-like illness.  Will treat with Tamiflu , tylenol , and ibuprofin.  Hydration.  Return to clinic is symptoms do start getting better in 2 weeks.  Printed out script for tamiflu  as widespread shortages occurring in the area.   Her updated medication list for this problem includes:    Tamiflu  75 Mg Caps (Oseltamivir  phosphate) ..... One tab by mouth two times a day x 5 days  Orders: [facility]- Est Level  3 (00786)   Complete Medication List: 1)  Omeprazole 40 Mg Cpdr (Omeprazole) .SABRA.. 1 by mouth daily 2)  Humira  3)  Ibuprofen 800 Mg Tabs (Ibuprofen) 4)  Oxycodone   5)  Flonase  50 Mcg/act Susp (Fluticasone  propionate) .... 2 sprays each nostril daily 6)  Mobic 7.5 Mg Tabs (Meloxicam) .SABRA.. 1 tab by mouth daily. 7)  Tamiflu  75 Mg Caps (Oseltamivir  phosphate) .... One tab by mouth two times a day x 5 days 8)  Tylenol  Arthritis Pain 650 Mg Cr-tabs (Acetaminophen ) .... One tab by mouth q8h as needed pain/body aches   Patient Instructions: 1)  Good to meet you today, 2)  It seems that you have the flu.  I will give you a script for Tamiflu , and some tylenol  and motrin to help your symptoms.  The flu can take 2 weeks or more to clear.  Be sure to drink plent of fluids. 3)  Come back in 2 weeks if you don't feel any better. 4)  -Dr. ONEIDA.   Prescriptions: TYLENOL  ARTHRITIS PAIN 650 MG CR-TABS (ACETAMINOPHEN ) one tab by mouth q8h as needed pain/body aches  #30 x 1   Entered and Authorized by:   DEBBY PETTIES MD    Signed by:   DEBBY PETTIES MD on 11/27/2007   Method used:   Print then Give to Patient   RxID:   [id number] TAMIFLU  75 MG CAPS (OSELTAMIVIR  PHOSPHATE) One tab by mouth two times a day x 5 days  #10 x 0   Entered and Authorized by:   DEBBY PETTIES MD   Signed by:   DEBBY PETTIES MD on 11/27/2007   Method used:   Print then Give to Patient   RxID:   [id number]  ]

## 2010-02-21 NOTE — Progress Notes (Signed)
Summary: Bleeding  ---- Converted from flag ---- ---- 10/03/2009 3:34 PM, Dennison Nancy RN wrote: Jenna Gordon stopped by the nurse's station this afternoon and wanted to let you know that she has been bleeding non stop since starting the Sprintec on 8/29.  She is going to try to find out the name of the pill she was taking before.  What should she do in the mean time? ------------------------------       New/Updated Medications: PROVERA 10 MG TABS (MEDROXYPROGESTERONE ACETATE) 1 by mouth daily x 5 days Prescriptions: PROVERA 10 MG TABS (MEDROXYPROGESTERONE ACETATE) 1 by mouth daily x 5 days  #5 x 0   Entered and Authorized by:   Milinda Antis MD   Signed by:   Milinda Antis MD on 10/03/2009   Method used:   Electronically to        [facility]. [phone]* (retail)       [address]       [place], [place]  [address]       Ph: [phone]       Fax: [phone]   RxID:   [id number]    Will stop bleeding with Provera by mouth x 5 days, then pt needs to come in for visit.  She can d/c the sprintec, use other method of OCP. Sent to [employer]

## 2010-02-21 NOTE — Consult Note (Signed)
Summary: [facility]   Imported By: De Nurse 11/17/2009 15:49:00  _____________________________________________________________________  External Attachment:    Type:   Image     Comment:   External Document

## 2010-02-21 NOTE — Progress Notes (Signed)
Summary: re: UA/TS  ---- Converted from flag ---- ---- 11/14/2009 8:14 AM, Helane Rima DO wrote: please call patient to let her know that urine culture was inconclusive - high amount of bacteria but no specific type. if she is still having symptoms - burning with urination, abdominal pain, back pain, urinary frequency - please have her come back in for another urine sample - future order in. ------------------------------  called pt. pt denies all sx's except c/o dysuria. pt will come back for urine tomorrow.

## 2010-02-21 NOTE — Progress Notes (Signed)
Summary: Rx Req  Phone Note Refill Request   Refills Requested: Medication #1:  FOLIC ACID 1 MG TABS 1 by mouth daily PT USES HEALTH DEPT.  Initial call taken by: Clydell Hakim,  March 10, 2009 10:58 AM  Follow-up for Phone Call        will forward to MD. Follow-up by: Theresia Lo RN,  March 10, 2009 11:07 AM  Additional Follow-up for Phone Call Additional follow up Details #1::        done.  also noted pt needs labwork so placed order. plz let her know Additional Follow-up by: Eustaquio Boyden  MD,  March 10, 2009 12:17 PM    Additional Follow-up for Phone Call Additional follow up Details #2::    message left on voicemail . Follow-up by: Theresia Lo RN,  March 10, 2009 2:27 PM  Prescriptions: FOLIC ACID 1 MG TABS (FOLIC ACID) 1 by mouth daily  #30 x 5   Entered and Authorized by:   Eustaquio Boyden  MD   Signed by:   Eustaquio Boyden  MD on 03/10/2009   Method used:   Faxed to ...       [facility] Department (retail)       [address] [place], [place]  [address]       Ph: [phone]       Fax: [phone]   RxID:   [id number]

## 2010-02-21 NOTE — Assessment & Plan Note (Signed)
Summary: 3 wk bleeding/[place]   Vital Signs:  Patient profile:   45 year old female Height:      63 inches Weight:      143.9 pounds BMI:     25.58 Temp:     98.2 degrees F oral Pulse rate:   80 / minute BP sitting:   133 / 88  (left arm) Cuff size:   regular  Vitals Entered By: Garen Grams LPN (October 10, 2009 9:20 AM) CC: vaginal bleeding x 21 days Is Patient Diabetic? No Pain Assessment Patient in pain? no        Primary Care [place] Alia:  Milinda Antis MD  CC:  vaginal bleeding x 21 days.  History of Present Illness: 45 yo female with 21 day  hx of vaginal bleeding.  Pt states she had been regular her entire life ith her menstration recently started on OCP due to starting having sexual intercourse and since that time has had bleeding..  Pt states she is going through at least 3 pads daily.  Pt did take the provera for 5 days and made no improvement.  Pt states she has had a couple episodes of feeling dizzy and lightheaded but denies LOC.  Pt does have hx of anemia.  Pt states last 2 days has had some lower abdominal pain as well which is new but denies dysuria, or trouble with defecation. Pt denies fever, chills, nausea, vomiting. Pt does have hx of endometriosis. No family hx of cancer.   Habits & Providers  Alcohol-Tobacco-Diet     Alcohol drinks/day: 0     Tobacco Status: never     Passive Smoke Exposure: no     Diet Comments: healthy, vegetables and fruit, lean meats     Diet Counseling: not indicated; diet is assessed to be healthy  Current Medications (verified): 1)  Humira Pen 40 Mg/0.6ml Kit (Adalimumab) .... 40 Mg Subcutaneously Every Other Week 2)  Folic Acid 1 Mg Tabs (Folic Acid) .Marland Kitchen.. 1 By Mouth Daily 3)  Drysol 20 % Soln (Aluminum Chloride) .... Apply Under Arms Daily 4)  Sprintec 28 0.25-35 Mg-Mcg Tabs (Norgestimate-Eth Estradiol) .... One Daily 5)  Provera 10 Mg Tabs (Medroxyprogesterone Acetate) .Marland Kitchen.. 1 By Mouth Daily X 10 Days 6)  Colace 100 Mg  Caps (Docusate Sodium) .Marland Kitchen.. 1 Tab By Mouth Two Times A Day Hold For Loose Bowel Movements  Allergies (verified): 1)  ! Sulfa  Past History:  Past medical, surgical, family and social histories (including risk factors) reviewed, and no changes noted (except as noted below).  Past Medical History: Reviewed history from 07/14/2009 and no changes required. positive ANA     RA     Sjogren's Tympanoplasty several years ago. GERD PTSD h/o Endometriosis h/o cervical dysplasia 1990s s/p LEEP/conization??  (no records), but normal paps since  Past Surgical History: Reviewed history from 07/14/2009 and no changes required. surgery for endometriosis --1994 cervical conization 1990s c-section 1993 ear surgery 2007 carpel tunnel surgery - bilateral 2009 trigger point release left finger 2010 perimenopausal metromenorrhagia - attempt at endometrial biopsy failed 2/2 cervical stenosis, scheduled f/u at [facility] but patient decided not to f/u.  Family History: Reviewed history from 08/03/2008 and no changes required. Gastric cancer: father and brother, paternal aunt Liver Ca:  maternal aunt Mom: gallbladder disease 2 brothers: HTN, DM  Social History: Reviewed history from 04/09/2007 and no changes required. 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  Has  2 daughters ages 88,9. No current sexual partner  Review of Systems       see hpi  Physical Exam  General:  Well-developed,well-nourished,in no acute distress; alert,appropriate and cooperative throughout examination Eyes:   EOMI. Perrla. Fundoscopic exam benign, pale conjunctiva Mouth:  no oral lesions Lungs:  Normal respiratory effort, chest expands symmetrically. Lungs are clear to auscultation, no crackles or wheezes. Heart:  Normal rate and regular rhythm. S1 and S2 normal without gallop, murmur, Abdomen:  soft,  normal bowel sounds, and no distention.  No CVA tenderness minimal  tender to palpation LQ b/l no masses felt no fibroids   Impression & Recommendations:  Problem # 1:  ANEMIA, IRON DEFICIENCY (ICD-280.9) will check CBC now make sure pt is not very anemic. Increased iron to two times a day dosing and added colace due to physical finding of pale conjunctiva and nail beds.  Her updated medication list for this problem includes:    Folic Acid 1 Mg Tabs (Folic acid) .Marland Kitchen... 1 by mouth daily  Orders: CBC w/Diff-[facility] (41660) [facility]- Est Level  3 (63016)  Problem # 2:  DYSMENORRHEA (ICD-625.3) will do 10 day course of provera and see if can stop the bleeding, send pt for u/s as well make sure everything is fine and no masses.  Pt only took 2 weeks of OCP's and liely has hormonal control issues, pt also has hx of endometriosis.  Pt did not have any large fibroids flet but if causing bleeding likely will be internal and not able to be felt, hence the u/s.  Will have pt f/u next wek with PCP>  Given red flags and when to return.  Orders: CBC w/Diff-[facility] (01093) [facility]- Est Level  3 (23557) Ultrasound (Ultrasound)  Complete Medication List: 1)  Humira Pen 40 Mg/0.26ml Kit (Adalimumab) .... 40 mg subcutaneously every other week 2)  Folic Acid 1 Mg Tabs (Folic acid) .Marland Kitchen.. 1 by mouth daily 3)  Drysol 20 % Soln (Aluminum chloride) .... Apply under arms daily 4)  Provera 10 Mg Tabs (Medroxyprogesterone acetate) .Marland Kitchen.. 1 by mouth daily x 10 days 5)  Colace 100 Mg Caps (Docusate sodium) .Marland Kitchen.. 1 tab by mouth two times a day hold for loose bowel movements  Patient Instructions: 1)  Nice to meet you 2)  I am sending you for an ultrasound 3)  I want you to do the provera again for 10 days 4)  I need you to make an appointment with Dr. Jeanice Lim in 1-2 weeks.  5)  Increase your inron to two times a day and take colace to avoid constipation 6)  If bleeding gets worse or you get a fever and severe abdominal pain please return to clinic.  Prescriptions: COLACE 100 MG CAPS (DOCUSATE SODIUM)  1 tab by mouth two times a day hold for loose bowel movements  #62 x 0   Entered and Authorized by:   Antoine Primas DO   Signed by:   Antoine Primas DO on 10/10/2009   Method used:   Electronically to        [facility]. [phone]* (retail)       [address]       [place], [place]  [address]       Ph: [phone]       Fax: [phone]   RxID:   [id number] PROVERA 10 MG TABS (MEDROXYPROGESTERONE ACETATE) 1 by mouth daily x 10 days  #10 x  0   Entered and Authorized by:   Antoine Primas DO   Signed by:   Antoine Primas DO on 10/10/2009   Method used:   Electronically to        [facility]. [phone]* (retail)       [address]       [place], [place]  [address]       Ph: [phone]       Fax: [phone]   RxID:   [id number]

## 2010-02-21 NOTE — Progress Notes (Signed)
  Phone Note Call from Patient   Caller: Patient Call For: [phone] Summary of Call: Need to know the results of the labs.  Still having symptoms like before.  Have been drinking plenty of water.  No relief. Initial call taken by: Abundio Miu,  November 16, 2009 4:11 PM  Follow-up for Phone Call        Patient informed of results, rx sent to wrong pharmacy, resent script to health dept. Patient states she also need rx for yeast infection since she always gets one after taking antibiotics, says MD said she would prescribe this as well. Message to MD Follow-up by: Garen Grams LPN,  November 16, 2009 5:03 PM  Additional Follow-up for Phone Call Additional follow up Details #1::        diflucan rx done. please send to HD. Additional Follow-up by: Helane Rima DO,  November 16, 2009 6:34 PM    New/Updated Medications: DIFLUCAN 150 MG TABS (FLUCONAZOLE) once daily Prescriptions: DIFLUCAN 150 MG TABS (FLUCONAZOLE) once daily  #1 x 0   Entered by:   Garen Grams LPN   Authorized by:   Helane Rima DO   Signed by:   Garen Grams LPN on 09/81/1914   Method used:   Faxed to ...       [facility] Department (retail)       [address] [place], [place]  [address]       Ph: [phone]       Fax: [phone]   RxID:   [id number] CEPHALEXIN 500 MG  TABS (CEPHALEXIN) take one by mouth two times a day x 3 days  #6 x 0   Entered by:   Garen Grams LPN   Authorized by:   Helane Rima DO   Signed by:   Garen Grams LPN on 40/34/7425   Method used:   Faxed to ...       [facility] Department (retail)       [address] [place], [place]  [address]       Ph: [phone]       Fax: [phone]   RxID:   [id number]

## 2010-02-21 NOTE — Letter (Signed)
Summary: Results Follow-up Letter  [facility]  [address]   [place], [place] [address]   Phone: [phone]  Fax: [phone]    05/26/2009  [address]  [place], [place]  [address]  Dear Ms. SEIB,   The following are the results of your recent test(s):  Test     Result     Your CBC with differential and CMP were normal.  Riones, Higado, cellulas blancas y rojas, todo normal.  Llamanos cuando veas al Reumatologo para verificar medicinas y ver cuales examenes de sangre se pueden parar.   _________________________________________________________  Please call for an appointment Or _________________________________________________________ _________________________________________________________ _________________________________________________________  Sincerely,  Eustaquio Boyden  MD [facility]           Appended Document: Results Follow-up Letter letter mailed.

## 2010-02-21 NOTE — Miscellaneous (Signed)
Summary: PATIENT SUMMARY  Clinical Lists Changes  Observations: Added new observation of RHEUMATOLMD: Ziolkowska (07/14/2009 16:06) Added new observation of PAST SURG HX: surgery for endometriosis --1994 cervical conization 1990s c-section 1993 ear surgery 2007 carpel tunnel surgery - bilateral 2009 trigger point release left finger 2010 perimenopausal metromenorrhagia - attempt at endometrial biopsy failed 2/2 cervical stenosis, scheduled f/u at [facility] but patient decided not to f/u. (07/14/2009 16:06) Added new observation of PAST MED HX: positive ANA     RA     Sjogren's Tympanoplasty several years ago. GERD PTSD h/o Endometriosis h/o cervical dysplasia 1990s s/p LEEP/conization??  (no records), but normal paps since (07/14/2009 16:06) Added new observation of PMH OTHER: Past medical, surgical, family and social histories (including risk factors) reviewed for relevance to current acute and chronic problems. 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  Scheduled f/u for rpt bx at [facility], pt cancelled 2/2 concern with price and decision not to pursue.  Habits & Providers     Rheumatologist: Ziolkowska   Past History:  Past medical, surgical, family and social histories (including risk factors) reviewed for relevance to current acute and chronic problems.  Past Medical History: positive ANA      RA     Sjogren's Tympanoplasty several years ago. GERD PTSD h/o Endometriosis h/o cervical dysplasia 1990s s/p LEEP/conization??  (no records), but normal paps since  Past Surgical History: surgery for endometriosis --1994 cervical conization 1990s c-section 1993 ear surgery 2007 carpel tunnel surgery - bilateral 2009 trigger point release left finger 2010 perimenopausal metromenorrhagia - attempt at endometrial biopsy failed 2/2 cervical stenosis, scheduled f/u at [facility] but patient decided not to f/u.   Complete Medication List: 1)  Humira Pen 40 Mg/0.18ml Kit (Adalimumab) .... 40 mg subcutaneously every other week 2)  Hydroxychloroquine Sulfate 200 Mg Tabs (Hydroxychloroquine sulfate) .... 1.5 tabs by mouth daily 3)  Folic Acid 1 Mg Tabs (Folic acid) .Marland Kitchen.. 1 by mouth daily 4)  Methotrexate 2.5 Mg Tabs (Methotrexate sodium) .... 6 tabs by mouth once weekly on saturday disp: qs x one month 5)  Ferrous Sulfate 325 (65 Fe) Mg Tbec (Ferrous sulfate) .Marland Kitchen.. 1 by mouth two times a day 6)  Drysol 20 % Soln (Aluminum chloride) .... Apply under arms daily 7)  Magic Mouthwash (benadryl, Lidocaine and Maalox)  .... One cc three times a day as needed sore throat.  qs 2 wks

## 2010-02-21 NOTE — Progress Notes (Signed)
  Phone Note Outgoing Call   Call placed by: Milinda Antis MD,  September 08, 2009 1:51 PM Details for Reason: Labs Summary of Call: Urine Cx pending, informed of labs, normal except sliglty low Hb, with history of anemia recenlty on MTX will add supplement daily, recheck in 3 months Initial call taken by: Milinda Antis MD,  September 08, 2009 1:51 PM  New Problems: ANEMIA, IRON DEFICIENCY (ICD-280.9)   New Problems: ANEMIA, IRON DEFICIENCY (ICD-280.9)

## 2010-02-21 NOTE — Assessment & Plan Note (Signed)
Summary: F/U  KH   Vital Signs:  Patient profile:   45 year old female Height:      63 inches Weight:      143 pounds BMI:     25.42 BSA:     1.68 Temp:     98.1 degrees F Pulse rate:   74 / minute BP sitting:   126 / 82  Vitals Entered By: Jone Baseman CMA (October 24, 2009 8:31 AM) CC: f/u bleeding, ?sinus infection Is Patient Diabetic? No Pain Assessment Patient in pain? no        Primary Care Provider:  Milinda Antis MD  CC:  f/u bleeding and ?sinus infection.  History of Present Illness:    Menorrhagia- pt seen on last visit s/p starting OCP with prolonged course of bleeding approx 22 days, given 5 days of Provera then prescribed 10 days as bleeding did not stop, pt stopped the 10 day supply early after cessation of bleeding, u/s showed possible small fibroids. Currently not sexually active. Contines to have some abd cramping, mild dysuria, no blood in urine, no vaginal discharge, per pt feels similar to previous UTI, darker urine, no back pain  Sinus infection- past month has noticed worsening foul odor to nasal draiange, associated with headache, frontal pressure, and ear pressure , +post nasal drip, no fever, tried benadryl, claritin, nasal saline, gargling without any help, feels like drainage has worsened, no jaw pain no sick contacts, no cough, no SOB  Habits & Providers  Alcohol-Tobacco-Diet     Alcohol drinks/day: 0     Tobacco Status: never     Passive Smoke Exposure: no     Diet Comments: healthy, vegetables and fruit, lean meats     Diet Counseling: not indicated; diet is assessed to be healthy  Current Medications (verified): 1)  Humira Pen 40 Mg/0.31ml Kit (Adalimumab) .... 40 Mg Subcutaneously Every Other Week 2)  Folic Acid 1 Mg Tabs (Folic Acid) .Marland Kitchen.. 1 By Mouth Daily 3)  Drysol 20 % Soln (Aluminum Chloride) .... Apply Under Arms Daily 4)  Provera 10 Mg Tabs (Medroxyprogesterone Acetate) .Marland Kitchen.. 1 By Mouth Daily X 10 Days 5)  Colace 100 Mg Caps  (Docusate Sodium) .Marland Kitchen.. 1 Tab By Mouth Two Times A Day Hold For Loose Bowel Movements 6)  Flonase 50 Mcg/act Susp (Fluticasone Propionate) .... 2 Spray Each Nostril Daily As Needed Allergies 7)  Augmentin 875-125 Mg Tabs (Amoxicillin-Pot Clavulanate) .Marland Kitchen.. 1 By Mouth Two Times A Day X 10 Days For Sinus Infection  Allergies (verified): 1)  ! Sulfa  Past History:  Past Medical History: positive ANA     RA     Sjogren's Tympanoplasty several years ago. GERD PTSD h/o Endometriosis h/o cervical dysplasia 1990s s/p LEEP/conization??  (no records), but normal paps since ?small submural fibroids  Physical Exam  General:  Well-developed,well-nourished,in no acute distress; alert,appropriate and cooperative throughout examination Vital signs noted  Eyes:  clear conjunctiva, non icteric Ears:  TM clear bilat, no drainage noted, no fluid behind TM Nose:  edematous with thick yellow mucous visualized, +erythema Mouth:  mild oropharygeal erythema, MMM no exudates Neck:  supple no LAD Lungs:  CTAB Heart:  Normal rate and regular rhythm. S1 and S2 normal , NO MURMUR Abdomen:  soft,  normal bowel sounds, and no distention.  No CVA tenderness minimal tender to palpation, non tender    Impression & Recommendations:  Problem # 1:  DYSMENORRHEA (ICD-625.3) Assessment Improved  resolved, with provera, normal endometrium therefore  will hold on endometrial biopsy unless change in bleeding pattern at this time, may have been secondary to new estrogen/progrestion combo  Orders: [facility]- Est  Level 4 (16109)  Problem # 2:  DYSURIA (ICD-788.1) Assessment: New UA neg, will culture urine Her updated medication list for this problem includes:    Augmentin 875-125 Mg Tabs (Amoxicillin-pot clavulanate) .Marland Kitchen... 1 by mouth two times a day x 10 days for sinus infection  Orders: Urinalysis-[facility] (00000) Urine Culture-[facility] (60454-09811) [facility]- Est  Level 4 (91478)  Problem # 3:  SINUSITIS - ACUTE-NOS  (ICD-461.9) Assessment: New  Treat with augmentin, prolonged course and nasal steroids Her updated medication list for this problem includes:    Flonase 50 Mcg/act Susp (Fluticasone propionate) .Marland Kitchen... 2 spray each nostril daily as needed allergies    Augmentin 875-125 Mg Tabs (Amoxicillin-pot clavulanate) .Marland Kitchen... 1 by mouth two times a day x 10 days for sinus infection  Orders: [facility]- Est  Level 4 (99214)  Complete Medication List: 1)  Humira Pen 40 Mg/0.20ml Kit (Adalimumab) .... 40 mg subcutaneously every other week 2)  Folic Acid 1 Mg Tabs (Folic acid) .Marland Kitchen.. 1 by mouth daily 3)  Drysol 20 % Soln (Aluminum chloride) .... Apply under arms daily 4)  Provera 10 Mg Tabs (Medroxyprogesterone acetate) .Marland Kitchen.. 1 by mouth daily x 10 days 5)  Colace 100 Mg Caps (Docusate sodium) .Marland Kitchen.. 1 tab by mouth two times a day hold for loose bowel movements 6)  Flonase 50 Mcg/act Susp (Fluticasone propionate) .... 2 spray each nostril daily as needed allergies 7)  Augmentin 875-125 Mg Tabs (Amoxicillin-pot clavulanate) .Marland Kitchen.. 1 by mouth two times a day x 10 days for sinus infection  Other Orders: Flu Vaccine 67yrs + (29562) Admin 1st Vaccine (13086) Admin 1st Vaccine [facility]) [phone])  Patient Instructions: 1)  I have sent over antibiotics for your sinus infection 2)  Your urine does not look infected, I will culture it and let you know if this needs to be treated 3)  We will obtain records from your rheumatologist 4)  If you have recurrent episode of bleeding we will set you up for a repeat endometrial biopsy Prescriptions: AUGMENTIN 875-125 MG TABS (AMOXICILLIN-POT CLAVULANATE) 1 by mouth two times a day x 10 days for sinus infection  #20 x 0   Entered and Authorized by:   Milinda Antis MD   Signed by:   Milinda Antis MD on 10/24/2009   Method used:   Print then Give to Patient   RxID:   [id number] FLONASE 50 MCG/ACT SUSP (FLUTICASONE PROPIONATE) 2 spray each nostril daily as needed allergies  #1 x 3    Entered and Authorized by:   Milinda Antis MD   Signed by:   Milinda Antis MD on 10/24/2009   Method used:   Print then Give to Patient   RxID:   [id number]   Laboratory Results   Urine Tests  Date/Time Received: October 24, 2009 8:38 AM  Date/Time Reported: October 24, 2009 9:13 AM   Routine Urinalysis   Color: yellow Appearance: Clear Glucose: negative   (Normal Range: Negative) Bilirubin: negative   (Normal Range: Negative) Ketone: negative   (Normal Range: Negative) Spec. Gravity: 1.020   (Normal Range: 1.003-1.035) Blood: negative   (Normal Range: Negative) pH: 8.0   (Normal Range: 5.0-8.0) Protein: negative   (Normal Range: Negative) Urobilinogen: 0.2   (Normal Range: 0-1) Nitrite: negative   (Normal Range: Negative) Leukocyte Esterace: negative   (Normal Range: Negative)  Comments: .........Marland Kitchenbiochemical negative; microscopic not indicated ...............test performed by......Marland KitchenBonnie A. [place], MLS (ASCP)cm       Influenza Vaccine    Vaccine Type: Fluvax 3+    Site: right deltoid    Mfr: GlaxoSmithKline    Dose: 0.5 ml    Route: IM    Given by: Garen Grams LPN    Exp. Date: 07/19/2010    Lot #: [id number]    VIS given: 08/16/09 version given October 24, 2009.  Flu Vaccine Consent Questions    Do you have a history of severe allergic reactions to this vaccine? no    Any prior history of allergic reactions to egg and/or gelatin? no    Do you have a sensitivity to the preservative Thimersol? no    Do you have a past history of Guillan-Barre Syndrome? no    Do you currently have an acute febrile illness? no    Have you ever had a severe reaction to latex? no    Vaccine information given and explained to patient? yes    Are you currently pregnant? no

## 2010-02-21 NOTE — Progress Notes (Signed)
  Phone Note Call from Patient   Caller: Patient Call For: [phone] Summary of Call: [name] gave wrong pharmacy info to Dr. Katrinka Blazing.  [facility] Initial call taken by: Britta Mccreedy mcgregor  Follow-up for Phone Call        faxed to pharmacy  Follow-up by: Antoine Primas DO,  October 10, 2009 2:12 PM    Prescriptions: PROVERA 10 MG TABS (MEDROXYPROGESTERONE ACETATE) 1 by mouth daily x 10 days  #10 x 0   Entered and Authorized by:   Antoine Primas DO   Signed by:   Antoine Primas DO on 10/10/2009   Method used:   Faxed to ...       [facility] Department (retail)       [address] [place], [place]  [address]       Ph: [phone]       Fax: [phone]   RxID:   [id number] COLACE 100 MG CAPS (DOCUSATE SODIUM) 1 tab by mouth two times a day hold for loose bowel movements  #62 x 3   Entered and Authorized by:   Antoine Primas DO   Signed by:   Antoine Primas DO on 10/10/2009   Method used:   Faxed to ...       [facility] Department (retail)       [address] [place], [place]  [address]       Ph: [phone]       Fax: [phone]   RxID:   [id number]

## 2010-02-21 NOTE — Miscellaneous (Signed)
Summary: Medical Records  Clinical Lists Changes  Observations: Added new observation of PAST MED HX: positive ANA- Rheumatolgist Dr. Leanor Rubenstein [facility] at [facility]- [phone]     RA - pos ANA, elevated Anti-DNS DS Ab     Sjogren's Eczema Tympanoplasty several years ago. GERD PTSD h/o Endometriosis h/o cervical dysplasia 1990s s/p LEEP/conization??  (no records), but normal paps since ?small submural fibroids (11/08/2009 20:40) Added new observation of HEP C AB: neg (02/05/2005 20:40) Added new observation of HBSAG: neg (02/05/2005 20:40)      Past Medical History:    positive ANA- Rheumatolgist Dr. Leanor Rubenstein [facility] at [facility]- [phone]        RA - pos ANA, elevated Anti-DNS DS Ab        Sjogren's    Eczema    Tympanoplasty several years ago.    GERD    PTSD    h/o Endometriosis    h/o cervical dysplasia 1990s s/p LEEP/conization??  (no records), but normal paps since    ?small submural fibroids    Allergies: 1)  ! Sulfa

## 2010-02-21 NOTE — Letter (Signed)
Summary: Normal Pap-RTC 1 year  All     ,     Phone:   Fax:     September 12, 2009     [facility] [address]  [place], [place] [address]    Dear Ms. ROEHRICH,  The results of your recent pap smear were: Negative for any evidence of malignancy or atypical cells.  We will see you in 1 year for your annual exam.  Please contact our office if you have any questions.  Sincerely,   Milinda Antis MD   Appended Document: Normal Pap-RTC 1 year mailed

## 2010-02-21 NOTE — Progress Notes (Signed)
Summary: re: UA CX/TS  ---- Converted from flag ---- ---- 10/25/2009 5:10 PM, Milinda Antis MD wrote: please let pt know her urine culture did not grow very much bacteria, there was Group B strep but this will be covered by the augmentin ------------------------------ called pt and informed.

## 2010-02-21 NOTE — Progress Notes (Signed)
Summary: re: lab/TS  ---- Converted from flag ---- ---- 10/11/2009 1:50 PM, Milinda Antis MD wrote: please let Leanora know her CBC was nomal, her Hb was 12.4 ------------------------------  pt informed.

## 2010-02-21 NOTE — Progress Notes (Signed)
Summary: Rx Prob  Phone Note Call from Patient Call back at Home Phone [phone]   Caller: Patient Summary of Call: Pt states that the pharmacy did not recieve the rx yesterday. Initial call taken by: Clydell Hakim,  October 04, 2009 10:46 AM  Follow-up for Phone Call        spoke with pharmacy and they do have RX. patient notified. Follow-up by: Theresia Lo RN,  October 04, 2009 11:23 AM

## 2010-02-21 NOTE — Progress Notes (Signed)
Summary: phn msg/re: bld work/ts  Phone Note Call from Patient Call back at [phone]   Caller: Patient Summary of Call: Pt has been put back on Methotrexate low dose of 2 a week.  Pt still needs to do CBC & CMP. Initial call taken by: Clydell Hakim,  Jun 06, 2009 8:51 AM  Follow-up for Phone Call        duly noted.  thanks.  needs CMP and CBC with diff Q2 months.  I put orders in chart.  Please let her know next blood draw should be July/August.  Consider spacing out to Q3 months for blood work. Follow-up by: Eustaquio Boyden  MD,  Jun 06, 2009 10:00 AM  Additional Follow-up for Phone Call Additional follow up Details #1::        called pt and informed Additional Follow-up by: Arlyss Repress CMA,,  Jun 07, 2009 10:56 AM

## 2010-02-21 NOTE — Assessment & Plan Note (Signed)
Summary: uti s/s/[place]   Vital Signs:  Patient profile:   45 year old female Height:      63 inches Weight:      147.3 pounds BMI:     26.19 Temp:     97.8 degrees F Pulse rate:   76 / minute BP sitting:   125 / 87  Vitals Entered By: Golden Circle RN (November 10, 2009 8:45 AM)  Primary Care Provider:  Milinda Antis MD  CC:  dysuria and vaginal discharge.  History of Present Illness: 45 yo F:  1. Dysuria: x several days, with strong odor to urine, intermittent low abdominal cramps, no hematuria or frequency. Hx of Abx use for sinus infection. then developed yeast infection. has been taking OTC monostat - last tube inserted today. no fever/chills, N/V/D, rash. monogamous relationship but would like STD testing to be safe. on Humira. spotting as LMP ending.  Habits & Providers  Alcohol-Tobacco-Diet     Alcohol drinks/day: 0     Tobacco Status: never  Exercise-Depression-Behavior     Does Patient Exercise: yes     Exercise Counseling: to improve exercise regimen     STD Risk: never     Drug Use: never     Seat Belt Use: always  Current Medications (verified): 1)  Humira Pen 40 Mg/0.64ml Kit (Adalimumab) .... 40 Mg Subcutaneously Every Other Week 2)  Folic Acid 1 Mg Tabs (Folic Acid) .Marland Kitchen.. 1 By Mouth Daily 3)  Drysol 20 % Soln (Aluminum Chloride) .... Apply Under Arms Daily 4)  Provera 10 Mg Tabs (Medroxyprogesterone Acetate) .Marland Kitchen.. 1 By Mouth Daily X 10 Days 5)  Colace 100 Mg Caps (Docusate Sodium) .Marland Kitchen.. 1 Tab By Mouth Two Times A Day Hold For Loose Bowel Movements 6)  Flonase 50 Mcg/act Susp (Fluticasone Propionate) .... 2 Spray Each Nostril Daily As Needed Allergies  Allergies (verified): 1)  ! Sulfa PMH-FH-SH reviewed for relevance  Review of Systems      See HPI  Physical Exam  General:  Well-developed, well-nourished, in no acute distress; alert, appropriate and cooperative throughout examination. Vital signs reviewed.  Abdomen:  Soft,  normal bowel sounds,  and no distention.  No CVA tenderness. Genitalia:  Pelvic Exam:        External: normal female genitalia without lesions or masses        Vagina: normal without lesions or masses        Cervix: normal without lesions or masses        Adnexa: normal bimanual exam without masses or fullness        Uterus: normal by palpation        Pap smear: not performed   Impression & Recommendations:  Problem # 1:  DYSURIA (ICD-788.1) Assessment New Inconclusive UA. Awaiting UCx.  Orders: Urinalysis-[facility] (00000) Urine Culture-[facility] ([id number]) [facility]- Est Level  3 (60630)  Problem # 2:  UNSPECIFIED VAGINITIS AND VULVOVAGINITIS (ICD-616.10) Normal exam. Wet prep negative. Awaiting GC/Chlamydia.  Orders: GC/Chlamydia-[facility] (87591/87491) Wet Prep- [facility] (16010) [facility]- Est Level  3 (93235)  Complete Medication List: 1)  Humira Pen 40 Mg/0.70ml Kit (Adalimumab) .... 40 mg subcutaneously every other week 2)  Folic Acid 1 Mg Tabs (Folic acid) .Marland Kitchen.. 1 by mouth daily 3)  Drysol 20 % Soln (Aluminum chloride) .... Apply under arms daily 4)  Provera 10 Mg Tabs (Medroxyprogesterone acetate) .Marland Kitchen.. 1 by mouth daily x 10 days 5)  Colace 100 Mg Caps (Docusate sodium) .Marland Kitchen.. 1 tab by mouth two times a day  hold for loose bowel movements 6)  Flonase 50 Mcg/act Susp (Fluticasone propionate) .... 2 spray each nostril daily as needed allergies  Patient Instructions: 1)  It was nice to see you today! 2)  We are sending your urine for culture.   Orders Added: 1)  Urinalysis-[facility] [00000] 2)  GC/Chlamydia-[facility] [87591/87491] 3)  Wet Prep- [facility] [87210] 4)  Urine Culture-[facility] [[id number]] 5)  [facility]- Est Level  3 [99213]    Laboratory Results   Urine Tests  Date/Time Received: November 10, 2009 9:08 AM  Date/Time Reported: November 10, 2009 10:26 AM   Routine Urinalysis   Color: yellow Appearance: Clear Glucose: negative   (Normal Range: Negative) Bilirubin: negative   (Normal Range: Negative) Ketone: negative    (Normal Range: Negative) Spec. Gravity: 1.010   (Normal Range: 1.003-1.035) Blood: negative   (Normal Range: Negative) pH: 6.5   (Normal Range: 5.0-8.0) Protein: negative   (Normal Range: Negative) Urobilinogen: 0.2   (Normal Range: 0-1) Nitrite: negative   (Normal Range: Negative) Leukocyte Esterace: trace   (Normal Range: Negative)  Urine Microscopic WBC/HPF: <5 Bacteria/HPF: 2+ Epithelial/HPF: <5    Comments: urine sent for culture ...............test performed by......Marland KitchenBonnie A. [place], MLS (ASCP)cm  Date/Time Received: November 10, 2009 9:08 AM  Date/Time Reported: November 10, 2009 10:27 AM   [place] Source: vag WBC/hpf: * Bacteria/hpf: * Clue cells/hpf: *  Negative whiff Yeast/hpf: * Trichomonas/hpf: * Comments: *due to so much cream unable to see anything but oily droplets and epith ...............test performed by......Marland KitchenBonnie A. [place], MLS (ASCP)cm

## 2010-02-21 NOTE — Assessment & Plan Note (Signed)
Summary: cpe/pap,tcb   Vital Signs:  Patient profile:   45 year old female Weight:      146.6 pounds Temp:     98.6 degrees F oral Pulse rate:   97 / minute Resp:     16 per minute BP supine:   124 / 79  Vitals Entered By: Milinda Antis MD (September 07, 2009 6:05 PM)  Primary Care Provider:  Eustaquio Boyden  MD  CC:  PAP, Check for UTI, and Labs.  History of Present Illness:   Pt new to me , reviewed PMH, meds  CPE- history of abnormal PAP, recently wnl, unable to do endometrial biopsy for prolonged bleeding at lat visit. Pt did not have procedure done at [facility] secondary to cost LMP 08/26/09 Now sexually active with one partner  UTI- treated for BV last visit with Flagyl, intiial symptoms were dark, foul odorous urine. Urine still dark but no odor no dysuria, wants to know if infection was cleared from urine  RA- no longer able to see Rheumatologist in [facility], currently going though financial aid, she wants to see if this will work out before looking for a new doctor. Currently on Humira, has stopped MTX and hydroxychloroquine, pain well controlled at this time. Needs labs as recently stopped MTX  Allergies: 1)  ! Sulfa  Past History:  Past Medical History: Last updated: 07/14/2009 positive ANA     RA     Sjogren's Tympanoplasty several years ago. GERD PTSD h/o Endometriosis h/o cervical dysplasia 1990s s/p LEEP/conization??  (no records), but normal paps since  Past Surgical History: Last updated: 07/14/2009 surgery for endometriosis --1994 cervical conization 1990s c-section 1993 ear surgery 2007 carpel tunnel surgery - bilateral 2009 trigger point release left finger 2010 perimenopausal metromenorrhagia - attempt at endometrial biopsy failed 2/2 cervical stenosis, scheduled f/u at [facility] but patient decided not to f/u.  Physical Exam  General:  Well-developed,well-nourished,in no acute distress; alert,appropriate and cooperative throughout examination Eyes:    EOMI. Perrla. Fundoscopic exam benign Mouth:  no oral lesions Neck:  supple and no thyroid nodules or tenderness.   Breasts:  No mass, nodules, thickening, tenderness, bulging, retraction, inflamation, nipple discharge or skin changes noted.   Lungs:  Normal respiratory effort, chest expands symmetrically. Lungs are clear to auscultation, no crackles or wheezes. Heart:  Normal rate and regular rhythm. S1 and S2 normal without gallop, murmur, Abdomen:  soft, non-tender, normal bowel sounds, and no distention.  No CVA tenderness Genitalia:  Pelvic Exam:        External: normal female genitalia without lesions or masses.  small fissure at perineum,no bleeding noted        Vagina: normal without lesions or masses.  No discharge        Cervix: normal without lesions or masses.  + scarring        Adnexa: normal bimanual exam without masses or fullness        Uterus: normal by palpation   Impression & Recommendations:  Problem # 1:  Preventive Health Care (ICD-V70.0) Assessment New No red flags, PAP smear Discussed Mammo, dental visit addressed, eye exam- difficult because pt uninsured Blood work done  Problem # 2:  RHEUMATOID ARTHRITIS (ICD-714.0) Assessment: Unchanged Updated meds Check labs If no longer on MTX does not need q 40month labs, pt will let me know if rheumatology changes anything The following medications were removed from the medication list:    Methotrexate 2.5 Mg Tabs (Methotrexate sodium) .Marland KitchenMarland KitchenMarland KitchenMarland Kitchen 6 tabs by  mouth once weekly on saturday disp: qs x one month Her updated medication list for this problem includes:    Humira Pen 40 Mg/0.61ml Kit (Adalimumab) .Marland KitchenMarland KitchenMarland KitchenMarland Kitchen 40 mg subcutaneously every other week  Orders: Comp Met-[facility] ([id number]) CBC w/Diff-[facility] (57846) [facility] - Est  40-64 yrs (96295)  Problem # 3:  DYSURIA (ICD-788.1) Assessment: Improved s/p treatment for BV, UA negative, will culture,no meds at this visit, pt not very symptomatic urine still dark low probability  for UTI The following medications were removed from the medication list:    Metronidazole 500 Mg Tabs (Metronidazole) ..... One two times a day for 7 days  Orders: Urinalysis-[facility] (00000) Urine Culture-[facility] ([id number]) [facility] - Est  40-64 yrs (02725)  Complete Medication List: 1)  Humira Pen 40 Mg/0.32ml Kit (Adalimumab) .... 40 mg subcutaneously every other week 2)  Folic Acid 1 Mg Tabs (Folic acid) .Marland Kitchen.. 1 by mouth daily 3)  Drysol 20 % Soln (Aluminum chloride) .... Apply under arms daily 4)  Sprintec 28 0.25-35 Mg-mcg Tabs (Norgestimate-eth estradiol) .... One daily  Other Orders: Pap Smear-[facility] ([id number]) GC/Chlamydia-[facility] (87591/87491)  Patient Instructions: 1)  I will call you with lab results and your Pap Smear results 2)  Next PAP smear in 1 year 3)  Please schedule your Mammogram 4)  Return for blood work in 3 months if you restart the Metotrexate 5)  I will call you with the urine culture results    Laboratory Results   Urine Tests  Date/Time Received: September 07, 2009 3:02 PM  Date/Time Reported: September 07, 2009 3:17 PM   Routine Urinalysis   Color: yellow Appearance: Clear Glucose: negative   (Normal Range: Negative) Bilirubin: negative   (Normal Range: Negative) Ketone: negative   (Normal Range: Negative) Spec. Gravity: 1.015   (Normal Range: 1.003-1.035) Blood: negative   (Normal Range: Negative) pH: 6.5   (Normal Range: 5.0-8.0) Protein: negative   (Normal Range: Negative) Urobilinogen: 0.2   (Normal Range: 0-1) Nitrite: negative   (Normal Range: Negative) Leukocyte Esterace: negative   (Normal Range: Negative)    Comments: ...............test performed by......Marland KitchenBonnie A. [place], MLS (ASCP)cm

## 2010-02-21 NOTE — Assessment & Plan Note (Signed)
Summary: ? urinary tract inf,tcb   Vital Signs:  Patient profile:   45 year old female Height:      63 inches Weight:      144 pounds BMI:     25.60 Temp:     98.1 degrees F oral Pulse rate:   73 / minute BP sitting:   112 / 83  (left arm)  Vitals Entered By: Tessie Fass CMA (August 23, 2009 8:47 AM) CC: UTI?   Primary Care Provider:  Eustaquio Boyden  MD  CC:  UTI?Marland Kitchen  History of Present Illness: Very strong odor of urine, boyfriend pointed it out to her.  Recently became sexually active again.  Denies dysuria, has some urgency.  Denies abdominal pain, back pain or chills.  Would like to begin contraception.  Current Medications (verified): 1)  Humira Pen 40 Mg/0.50ml Kit (Adalimumab) .... 40 Mg Subcutaneously Every Other Week 2)  Hydroxychloroquine Sulfate 200 Mg Tabs (Hydroxychloroquine Sulfate) .... 1.5 Tabs By Mouth Daily 3)  Folic Acid 1 Mg Tabs (Folic Acid) .Marland Kitchen.. 1 By Mouth Daily 4)  Methotrexate 2.5 Mg Tabs (Methotrexate Sodium) .... 6 Tabs By Mouth Once Weekly On Saturday Disp: Qs X One Month 5)  Ferrous Sulfate 325 (65 Fe) Mg Tbec (Ferrous Sulfate) .Marland Kitchen.. 1 By Mouth Two Times A Day 6)  Drysol 20 % Soln (Aluminum Chloride) .... Apply Under Arms Daily 7)  Magic Mouthwash (Benadryl, Lidocaine and Maalox) .... One Cc Three Times A Day As Needed Sore Throat.  Qs 2 Wks 8)  Metronidazole 500 Mg Tabs (Metronidazole) .... One Two Times A Day For 7 Days 9)  Sprintec 28 0.25-35 Mg-Mcg Tabs (Norgestimate-Eth Estradiol) .... One Daily  Allergies (verified): 1)  ! Sulfa  Review of Systems      See HPI  Physical Exam  General:  Well-developed,well-nourished,in no acute distress; alert,appropriate and cooperative throughout examination Abdomen:  soft and non-tender.     Impression & Recommendations:  Problem # 1:  DYSURIA (ICD-788.1)  Moslty malodorous urine with 3+++ bacteria, will cover for BV as smell is likely from vaginal discharge.   Her updated medication list for  this problem includes:    Metronidazole 500 Mg Tabs (Metronidazole) ..... One two times a day for 7 days  Orders: Urinalysis-[facility] (00000) [facility]- Est Level  3 (18841)  Problem # 2:  CONTRACEPTIVE MANAGEMENT (ICD-V25.09) Begin OCP Orders: U Preg-[facility] (81025) [facility]- Est Level  3 (66063)  Complete Medication List: 1)  Humira Pen 40 Mg/0.2ml Kit (Adalimumab) .... 40 mg subcutaneously every other week 2)  Hydroxychloroquine Sulfate 200 Mg Tabs (Hydroxychloroquine sulfate) .... 1.5 tabs by mouth daily 3)  Folic Acid 1 Mg Tabs (Folic acid) .Marland Kitchen.. 1 by mouth daily 4)  Methotrexate 2.5 Mg Tabs (Methotrexate sodium) .... 6 tabs by mouth once weekly on saturday disp: qs x one month 5)  Ferrous Sulfate 325 (65 Fe) Mg Tbec (Ferrous sulfate) .Marland Kitchen.. 1 by mouth two times a day 6)  Drysol 20 % Soln (Aluminum chloride) .... Apply under arms daily 7)  Magic Mouthwash (benadryl, Lidocaine and Maalox)  .... One cc three times a day as needed sore throat.  qs 2 wks 8)  Metronidazole 500 Mg Tabs (Metronidazole) .... One two times a day for 7 days 9)  Sprintec 28 0.25-35 Mg-mcg Tabs (Norgestimate-eth estradiol) .... One daily  Patient Instructions: 1)  Please schedule a follow-up appointment as needed .  Prescriptions: SPRINTEC 28 0.25-35 MG-MCG TABS (NORGESTIMATE-ETH ESTRADIOL) One daily Brand medically necessary #1  x 11   Entered and Authorized by:   Luretha Murphy NP   Signed by:   Luretha Murphy NP on 08/23/2009   Method used:   Printed then faxed to ...       [facility] [address] [phone]* (retail)       [address]       [place], [place]  [address]       Ph: [phone]       Fax: [phone]   RxID:   [id number] METRONIDAZOLE 500 MG TABS (METRONIDAZOLE) one two times a day for 7 days Brand medically necessary #14 x 0   Entered and Authorized by:   Luretha Murphy NP   Signed by:   Luretha Murphy NP on 08/23/2009   Method used:   Printed then faxed to ...       [facility] [address]  [phone]* (retail)       [address]       [place], [place]  [address]       Ph: [phone]       Fax: [phone]   RxID:   [id number]   Laboratory Results   Urine Tests  Date/Time Received: August 23, 2009 8:50 AM  Date/Time Reported: August 23, 2009 9:25 AM    Routine Urinalysis   Color: yellow Appearance: Clear Glucose: negative   (Normal Range: Negative) Bilirubin: negative   (Normal Range: Negative) Ketone: negative   (Normal Range: Negative) Spec. Gravity: 1.015   (Normal Range: 1.003-1.035) Blood: negative   (Normal Range: Negative) pH: 7.5   (Normal Range: 5.0-8.0) Protein: negative   (Normal Range: Negative) Urobilinogen: 0.2   (Normal Range: 0-1) Nitrite: positive   (Normal Range: Negative) Leukocyte Esterace: trace   (Normal Range: Negative)  Urine Microscopic WBC/HPF: 1-5 RBC/HPF: rare Bacteria/HPF: 3++ Epithelial/HPF: 1-5    Urine HCG: negative Comments: ...........test performed by...........Marland KitchenTerese Door, CMA

## 2010-02-23 NOTE — Miscellaneous (Signed)
  Clinical Lists Changes  Problems: Removed problem of UNSPECIFIED VAGINITIS AND VULVOVAGINITIS (ICD-616.10) Removed problem of SINUSITIS - ACUTE-NOS (ICD-461.9) Removed problem of DYSMENORRHEA (ICD-625.3) Removed problem of OTHER SPECIFIED IRON DEFICIENCY ANEMIAS (ICD-280.8) Removed problem of CONTRACEPTIVE MANAGEMENT (ICD-V25.09) Removed problem of History of  ENDOMETRIOSIS (ICD-617.9) Removed problem of SACROILIITIS, LEFT (ICD-720.2) Removed problem of RESTLESS LEG SYNDROME (ICD-333.94) Removed problem of History of  SHINGLES (ICD-053.9) Observations: Added new observation of PAST MED HX: positive ANA- Rheumatolgist Dr. Leanor Rubenstein [facility] at [facility]- [phone]     RA - pos ANA, elevated Anti-DNS DS Ab     Sjogren's Eczema Tympanoplasty several years ago. GERD PTSD h/o Endometriosis h/o Shingles h/o cervical dysplasia 1990s s/p LEEP/conization??  (no records), but normal paps since ?small submural fibroids (01/18/2010 0:02)      Past Medical History:    positive ANA- Rheumatolgist Dr. Leanor Rubenstein [facility] at [facility]- [phone]        RA - pos ANA, elevated Anti-DNS DS Ab        Sjogren's    Eczema    Tympanoplasty several years ago.    GERD    PTSD    h/o Endometriosis    h/o Shingles    h/o cervical dysplasia 1990s s/p LEEP/conization??  (no records), but normal paps since    ?small submural fibroids

## 2010-03-02 ENCOUNTER — Other Ambulatory Visit: Payer: Self-pay | Admitting: Obstetrics

## 2010-03-02 ENCOUNTER — Ambulatory Visit (HOSPITAL_COMMUNITY)
Admission: RE | Admit: 2010-03-02 | Discharge: 2010-03-02 | Disposition: A | Discharge status: Home / Self Care | Payer: 59 | Source: Ambulatory Visit | Attending: Obstetrics | Admitting: Obstetrics

## 2010-03-02 DIAGNOSIS — N938 Other specified abnormal uterine and vaginal bleeding: Secondary | ICD-10-CM | POA: Insufficient documentation

## 2010-03-02 DIAGNOSIS — N949 Unspecified condition associated with female genital organs and menstrual cycle: Secondary | ICD-10-CM | POA: Insufficient documentation

## 2010-03-02 HISTORY — PX: HYSTEROSCOPY WITH D & C: SHX1775

## 2010-03-02 LAB — CBC
MCH: 28.1 pg (ref 26.0–34.0)
MCHC: 32.9 g/dL (ref 30.0–36.0)
MCV: 85.3 fL (ref 78.0–100.0)
Platelets: 162 10*3/uL (ref 150–400)
RBC: 4.56 MIL/uL (ref 3.87–5.11)

## 2010-03-02 LAB — PREGNANCY, URINE: Preg Test, Ur: NEGATIVE

## 2010-03-08 NOTE — Op Note (Signed)
  [name], Jenna Gordon              ACCOUNT NO.:  [id number]  MEDICAL RECORD NO.:  [id number]           PATIENT TYPE:  O  LOCATION:  [facility]                          [facility]  PHYSICIAN:  Kathreen Cosier, M.D.DATE OF BIRTH:  1965-11-27  DATE OF PROCEDURE:  03/02/2010 DATE OF DISCHARGE:                              OPERATIVE REPORT   PREOPERATIVE DIAGNOSIS:  Dysfunctional uterine bleeding.  POSTOPERATIVE DIAGNOSIS:  Dysfunctional uterine bleeding.  PROCEDURE IN DETAILS:  Under general anesthesia the patient in lithotomy position, perineum and vagina were prepped and draped, bladder emptied with straight catheter.  Speculum was placed in the vagina.  Cervix was injected with 10 mL of 1% Xylocaine.  Anterior lip of the cervix was grasped with tenaculum.  Endometrial cavity sounded 8 cm.  The endocervix was curetted.  Small amount of tissue obtained.  Cervix then dilated to #27 Shawnie Pons and the hysteroscope inserted.  Uterine cavity was normal except for some adhesions.  The hysteroscope was removed and fluid deficit 100 mL.  Sharp curettage was performed.  A moderate amount of tissue obtained.  The patient tolerated the procedure well, taken to recovery room in good condition.          ______________________________ Kathreen Cosier, M.D.     BAM/MEDQ  D:  03/02/2010  T:  03/02/2010  Job:  [id number]  Electronically Signed by Francoise Ceo M.D. on 03/08/2010 07:18:00 AM

## 2010-09-06 ENCOUNTER — Emergency Department (HOSPITAL_COMMUNITY)
Admission: EM | Admit: 2010-09-06 | Discharge: 2010-09-06 | Disposition: A | Discharge status: Home / Self Care | Payer: 59 | Attending: Emergency Medicine | Admitting: Emergency Medicine

## 2010-09-06 DIAGNOSIS — I10 Essential (primary) hypertension: Secondary | ICD-10-CM | POA: Insufficient documentation

## 2010-09-06 DIAGNOSIS — L2989 Other pruritus: Secondary | ICD-10-CM | POA: Insufficient documentation

## 2010-09-06 DIAGNOSIS — M069 Rheumatoid arthritis, unspecified: Secondary | ICD-10-CM | POA: Insufficient documentation

## 2010-09-06 DIAGNOSIS — R220 Localized swelling, mass and lump, head: Secondary | ICD-10-CM | POA: Insufficient documentation

## 2010-09-06 DIAGNOSIS — R21 Rash and other nonspecific skin eruption: Secondary | ICD-10-CM | POA: Insufficient documentation

## 2010-09-06 DIAGNOSIS — R221 Localized swelling, mass and lump, neck: Secondary | ICD-10-CM | POA: Insufficient documentation

## 2010-09-06 DIAGNOSIS — T7840XA Allergy, unspecified, initial encounter: Secondary | ICD-10-CM | POA: Insufficient documentation

## 2010-09-06 DIAGNOSIS — R070 Pain in throat: Secondary | ICD-10-CM | POA: Insufficient documentation

## 2010-09-06 DIAGNOSIS — R490 Dysphonia: Secondary | ICD-10-CM | POA: Insufficient documentation

## 2010-09-06 DIAGNOSIS — R110 Nausea: Secondary | ICD-10-CM | POA: Insufficient documentation

## 2010-09-06 DIAGNOSIS — Z79899 Other long term (current) drug therapy: Secondary | ICD-10-CM | POA: Insufficient documentation

## 2010-09-06 DIAGNOSIS — L298 Other pruritus: Secondary | ICD-10-CM | POA: Insufficient documentation

## 2010-09-06 DIAGNOSIS — M2550 Pain in unspecified joint: Secondary | ICD-10-CM | POA: Insufficient documentation

## 2010-09-06 DIAGNOSIS — L509 Urticaria, unspecified: Secondary | ICD-10-CM | POA: Insufficient documentation

## 2010-09-06 LAB — RAPID STREP SCREEN (MED CTR MEBANE ONLY): Streptococcus, Group A Screen (Direct): NEGATIVE

## 2010-09-18 ENCOUNTER — Emergency Department (HOSPITAL_COMMUNITY): Payer: No Typology Code available for payment source

## 2010-09-18 ENCOUNTER — Emergency Department (HOSPITAL_COMMUNITY)
Admission: EM | Admit: 2010-09-18 | Discharge: 2010-09-18 | Disposition: A | Discharge status: Home / Self Care | Payer: No Typology Code available for payment source | Attending: Emergency Medicine | Admitting: Emergency Medicine

## 2010-09-18 DIAGNOSIS — M25519 Pain in unspecified shoulder: Secondary | ICD-10-CM | POA: Insufficient documentation

## 2010-09-18 DIAGNOSIS — R209 Unspecified disturbances of skin sensation: Secondary | ICD-10-CM | POA: Insufficient documentation

## 2010-09-18 DIAGNOSIS — M542 Cervicalgia: Secondary | ICD-10-CM | POA: Insufficient documentation

## 2010-09-18 DIAGNOSIS — M069 Rheumatoid arthritis, unspecified: Secondary | ICD-10-CM | POA: Insufficient documentation

## 2010-09-18 DIAGNOSIS — S139XXA Sprain of joints and ligaments of unspecified parts of neck, initial encounter: Secondary | ICD-10-CM | POA: Insufficient documentation

## 2010-09-18 DIAGNOSIS — IMO0002 Reserved for concepts with insufficient information to code with codable children: Secondary | ICD-10-CM | POA: Insufficient documentation

## 2010-10-19 LAB — POCT RAPID STREP A: Streptococcus, Group A Screen (Direct): NEGATIVE

## 2010-11-17 ENCOUNTER — Telehealth: Payer: Self-pay | Admitting: Family Medicine

## 2010-11-17 NOTE — Telephone Encounter (Signed)
Jenna Gordon wanted to infrom you that you may be receiving a call from [facility] [employer] Dept Human Resource office regarding ?employment for a new position.

## 2011-03-20 ENCOUNTER — Ambulatory Visit (INDEPENDENT_AMBULATORY_CARE_PROVIDER_SITE_OTHER): Payer: 59 | Admitting: Family Medicine

## 2011-03-20 VITALS — BP 119/86 | HR 78 | Temp 98.1°F | Resp 16 | Ht 63.5 in | Wt 136.8 lb

## 2011-03-20 DIAGNOSIS — J019 Acute sinusitis, unspecified: Secondary | ICD-10-CM

## 2011-03-20 DIAGNOSIS — H109 Unspecified conjunctivitis: Secondary | ICD-10-CM

## 2011-03-20 NOTE — Progress Notes (Signed)
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 She comes in with an eight-day history of progressive sinus congestion, cough, eye redness and eye discharge. She believes that she contracted this illness from to see doctors at work.  Objective: Bilateral eye injection with normal fundi, HEENT: Unremarkable except for mucopurulent discharge bilaterally nose.  Chest clear:  Heart regular no murmur or rub skin warm and dry    Assessment: Sinus, and conjunctivitis  Plan Tobrex and amoxicillin

## 2011-04-12 ENCOUNTER — Encounter: Payer: Self-pay | Admitting: Family Medicine

## 2011-04-12 ENCOUNTER — Ambulatory Visit (INDEPENDENT_AMBULATORY_CARE_PROVIDER_SITE_OTHER): Payer: 59 | Admitting: Family Medicine

## 2011-04-12 VITALS — BP 122/86 | HR 93 | Ht 63.0 in | Wt 136.4 lb

## 2011-04-12 DIAGNOSIS — B354 Tinea corporis: Secondary | ICD-10-CM

## 2011-04-12 DIAGNOSIS — J029 Acute pharyngitis, unspecified: Secondary | ICD-10-CM

## 2011-04-12 DIAGNOSIS — O99019 Anemia complicating pregnancy, unspecified trimester: Secondary | ICD-10-CM

## 2011-04-12 DIAGNOSIS — J020 Streptococcal pharyngitis: Secondary | ICD-10-CM

## 2011-04-12 MED ORDER — PENICILLIN G BENZATHINE 1200000 UNIT/2ML IM SUSP
1.2000 10*6.[IU] | Freq: Once | INTRAMUSCULAR | Status: AC
  Administered 2011-04-12: 1.2 10*6.[IU] via INTRAMUSCULAR

## 2011-04-12 MED ORDER — KETOCONAZOLE 2 % EX CREA: TOPICAL_CREAM | Freq: Two times a day (BID) | CUTANEOUS | Status: AC

## 2011-04-12 NOTE — Patient Instructions (Signed)
Fungus Infection of the Skin An infection of your skin caused by a fungus is a very common problem. Treatment depends on which part of the body is affected. Types of fungal skin infection include:  Athlete's Foot(Tinea pedis). This infection starts between the toes and may involve the entire sole and sides of foot. It is the most common fungal disease. It is made worse by heat, moisture, and friction. To treat, wash your feet 2 to 3 times daily. Dry thoroughly between the toes. Use medicated foot powder or cream as directed on the package. Plain talc, cornstarch, or rice powder may be dusted into socks and shoes to keep the feet dry. Wearing footwear that allows ventilation is also helpful.   Ringworm (Tinea corporis and tinea capitis). This infection causes scaly red rings to form on the skin or scalp. For skin sores, apply medicated lotion or cream as directed on the package. For the scalp, medicated shampoo may be used with with other therapies. Ringworm of the scalp or fingernails usually requires using oral medicine for 2 to 4 months.   Tinea versicolor. This infection appears as painless, scaly, patchy areas of discolored skin (whitish to light brown). It is more common in the summer and favors oily areas of the skin such as those found at the chest, abdomen, back, pubis, neck, and body folds. It can be treated with medicated shampoo or with medicated topical cream. Oral antifungals may be needed for more active infections. The light and/or dark spots may take time to get better and is not a sign of treatment failure.  Fungal infections may need to be treated for several weeks to be cured. It is important not to treat fungal infections with steroids or combination medicine that contains an antifungal and steroid as these will make the fungal infection worse. SEEK MEDICAL CARE IF:   You have persistent itching or rawness.   You have an oral temperature above 102 F (38.9 C).  Document Released:  02/16/2004 Document Revised: 12/28/2010 Document Reviewed: 05/03/2009 [facility] Patient Information 2012 [place], [place].  Strep Throat Strep throat is an infection of the throat caused by a bacteria named Streptococcus pyogenes. Your caregiver may call the infection streptococcal "tonsillitis" or "pharyngitis" depending on whether there are signs of inflammation in the tonsils or back of the throat. Strep throat is most common in children from 35 to 30 years old during the cold months of the year, but it can occur in people of any age during any season. This infection is spread from person to person (contagious) through coughing, sneezing, or other close contact. SYMPTOMS   Fever or chills.   Painful, swollen, red tonsils or throat.   Pain or difficulty when swallowing.   White or yellow spots on the tonsils or throat.   Swollen, tender lymph nodes or "glands" of the neck or under the jaw.   Red rash all over the body (rare).  DIAGNOSIS  Many different infections can cause the same symptoms. A test must be done to confirm the diagnosis so the right treatment can be given. A "rapid strep test" can help your caregiver make the diagnosis in a few minutes. If this test is not available, a light swab of the infected area can be used for a throat culture test. If a throat culture test is done, results are usually available in a day or two. TREATMENT  Strep throat is treated with antibiotic medicine. HOME CARE INSTRUCTIONS   Gargle with 1 tsp of  salt in 1 cup of warm water, 3 to 4 times per day or as needed for comfort.   Family members who also have a sore throat or fever should be tested for strep throat and treated with antibiotics if they have the strep infection.   Make sure everyone in your household washes their hands well.   Do not share food, drinking cups, or personal items that could cause the infection to spread to others.   You may need to eat a soft food diet until your sore  throat gets better.   Drink enough water and fluids to keep your urine clear or pale yellow. This will help prevent dehydration.   Get plenty of rest.   Stay home from school, daycare, or work until you have been on antibiotics for 24 hours.   Only take over-the-counter or prescription medicines for pain, discomfort, or fever as directed by your caregiver.   If antibiotics are prescribed, take them as directed. Finish them even if you start to feel better.  SEEK MEDICAL CARE IF:   The glands in your neck continue to enlarge.   You develop a rash, cough, or earache.   You cough up green, yellow-brown, or bloody sputum.   You have pain or discomfort not controlled by medicines.   Your problems seem to be getting worse rather than better.  SEEK IMMEDIATE MEDICAL CARE IF:   You develop any new symptoms such as vomiting, severe headache, stiff or painful neck, chest pain, shortness of breath, or trouble swallowing.   You develop severe throat pain, drooling, or changes in your voice.   You develop swelling of the neck, or the skin on the neck becomes red and tender.   You have a fever.   You develop signs of dehydration, such as fatigue, dry mouth, and decreased urination.   You become increasingly sleepy, or you cannot wake up completely.  Document Released: 01/06/2000 Document Revised: 12/28/2010 Document Reviewed: 03/09/2010 [facility] Patient Information 2012 [place], [place].

## 2011-04-18 DIAGNOSIS — L309 Dermatitis, unspecified: Secondary | ICD-10-CM | POA: Insufficient documentation

## 2011-04-18 DIAGNOSIS — J020 Streptococcal pharyngitis: Secondary | ICD-10-CM | POA: Insufficient documentation

## 2011-04-18 NOTE — Assessment & Plan Note (Signed)
Rapid strep positive. Centor criteria >3/4. Treated with bicillin 1.2 million units IM x 1. Infectious and post strep red flags reviewed. Follow as needed.

## 2011-04-18 NOTE — Progress Notes (Signed)
  Subjective:    Patient ID: Jenna Gordon, female    DOB: 1965/08/15, 46 y.o.   MRN: [id number]  HPI SORE THROAT  Onset: 3-4 days  Description: sore throat, difficulty swallowing for last 3-4 days. Positive sick contact in kids with similar sxs  Modifying factors: Pt currently being treated for RA with humira.   Symptoms  Fever:  No  URI symptoms: yes Cough: no Headache: no  Rash:  Yes, on posterior neck for last 2-3 weeks, has progressively worsened with moisture exposure  Swollen glands:  Yes  Recent Strep Exposure: possibly  LUQ pain: no Heartburn/brash: no Allergy Symptoms: no  Red Flags STD exposure: no Breathing difficulty: no Drooling: no Trismus: no    Review of Systems See HPI, otherwise ROS negative.    Objective:   Physical Exam Gen: up in chair, NAD HEENT: NCAT, EOMI, TMs clear bilaterally, +nasal erythema, rhinorrhea bilaterally, + post oropharyngeal erythema, + tonsillar exudates, mild bilateral cervical LAD, + noted scaling rash posterior neck along necklace line.  CV: RRR, no murmurs auscultated PULM: CTAB, no wheezes, rales, rhoncii ABD: S/NT/+ bowel sounds  EXT: 2+ peripheral pulses   Assessment & Plan:

## 2011-04-18 NOTE — Assessment & Plan Note (Signed)
Rash on posterior neck most consistent with tineal infection. Higher risk for this given use of anti-rheumatologic/DMARD medications. Will treat with topical ketoconazole. Red flags for return including lack of resolution of rash discussed. Handout given. Follow up prn.

## 2011-06-12 ENCOUNTER — Ambulatory Visit (INDEPENDENT_AMBULATORY_CARE_PROVIDER_SITE_OTHER): Payer: 59 | Admitting: Family Medicine

## 2011-06-12 ENCOUNTER — Encounter: Payer: Self-pay | Admitting: Family Medicine

## 2011-06-12 VITALS — BP 118/78 | HR 88 | Ht 63.0 in | Wt 139.0 lb

## 2011-06-12 DIAGNOSIS — L309 Dermatitis, unspecified: Secondary | ICD-10-CM

## 2011-06-12 DIAGNOSIS — L259 Unspecified contact dermatitis, unspecified cause: Secondary | ICD-10-CM

## 2011-06-12 MED ORDER — CLOBETASOL PROPIONATE 0.05 % EX CREA: TOPICAL_CREAM | Freq: Two times a day (BID) | CUTANEOUS | Status: AC

## 2011-06-12 NOTE — Patient Instructions (Signed)
It was a pleasure to see you today.   I am prescribing a topical steroid cream (clobetasol, or Temovate), to use on the nape of the neck, for up to 2 weeks (2 times daily).  Do not use on the face or near the eyes.   Guaifenesin (Mucinex) 600mg  every 12 hours to thin mucus.  Nasal saline and the Flonase for nasal dryness.

## 2011-06-13 NOTE — Progress Notes (Signed)
  Subjective:    Patient ID: Jenna Gordon, female    DOB: 09-May-1965, 46 y.o.   MRN: [id number]  HPI Seen in follow up today for itchy patch of skin along nape of neck, more prominent on the R side.  Has had a "pimple" arise on the skin to the right of midline.  Seen March 21, diagnosed with tinea and given topical ketoconazole.  Some improvement over the 2 weeks that she used it, but not completely resolved. Now it is back; itches.  Suffers from seasonal allergies; dry nose with crusty mucus from nares.  Diagnosed with Sjogrens syndrome, gives dry mouth. Uses NaCl spray to moisten nostrils.  Also using Flonase.   No fevers or chills. Nonsmoker.  Review of Systems  See HPI     Objective:   Physical Exam  Alert, well appearing, no apparent distress HEENT Neck supple. Without adenopathy.  SKIN along nape of neck with patch of mild erythema, size of a quarter, not flaking, no raised border, no central clearing. Small unroofed (15mm2) vesicle adjacent to patch.      Assessment & Plan:

## 2011-06-13 NOTE — Assessment & Plan Note (Signed)
Mild patch of erythema, without appearance of dermatophyte.  Trial moderate-potency topical steroid for limited time use. Discussed avoidance of use on face, near mucus membranes/eyes.

## 2011-10-02 ENCOUNTER — Ambulatory Visit (INDEPENDENT_AMBULATORY_CARE_PROVIDER_SITE_OTHER): Payer: 59 | Admitting: *Deleted

## 2011-10-02 DIAGNOSIS — Z23 Encounter for immunization: Secondary | ICD-10-CM

## 2011-10-19 ENCOUNTER — Telehealth: Payer: Self-pay | Admitting: Family Medicine

## 2011-10-19 NOTE — Telephone Encounter (Signed)
Forward to PCP for refill request 

## 2011-10-19 NOTE — Telephone Encounter (Signed)
Pt had an allergic reaction last year and she is now out of EPI pen - wants to know if she can have another refill [facility] [address] [facility]

## 2011-10-22 MED ORDER — EPINEPHRINE 0.3 MG/0.3ML IJ DEVI: 0.3000 mg | Freq: Once | INTRAMUSCULAR | Status: DC

## 2011-10-22 NOTE — Telephone Encounter (Signed)
Epi Pen refill completed. Paula Compton, MD

## 2011-11-15 ENCOUNTER — Encounter: Payer: Self-pay | Admitting: Family Medicine

## 2011-11-15 LAB — BASIC METABOLIC PANEL
Creatinine: 0.6 mg/dL (ref 0.5–1.1)
Glucose: 97 mg/dL
Potassium: 3.9 mmol/L (ref 3.4–5.3)
Sodium: 140 mmol/L (ref 137–147)

## 2011-11-15 LAB — CBC AND DIFFERENTIAL: Hemoglobin: 9.4 g/dL — AB (ref 12.0–16.0)

## 2011-11-15 LAB — HEPATIC FUNCTION PANEL: AST: 15 U/L (ref 13–35)

## 2011-11-21 ENCOUNTER — Telehealth: Payer: Self-pay | Admitting: Family Medicine

## 2011-11-21 DIAGNOSIS — D509 Iron deficiency anemia, unspecified: Secondary | ICD-10-CM

## 2011-11-21 NOTE — Telephone Encounter (Signed)
Called Jenna Gordon to let her know that I received the labs from Dr. Laurina Bustle at [facility]; she had a CBC with Hgb 9.4, MCV 66.3, MCH 20.7.  Plts 195K, WBC 5.4.  She has been seen by [facility] in the past (had endoscopic evaluation several years ago).  Father with gastric cancer in the past.  Jenna Gordon suffers from GERD.  She reports no change in menses, no heavy flow or prolonged menstruation.   Plan to confirm iron deficiency with iron studies; I have asked Jenna Gordon to schedule her own appointment with [facility] for consideration endoscopic evaluation.   She is leaving for vacation tomorrow, to return on Nov 18th.  I will place a referral, with caveat that patient will schedule her own appointment with [facility] (to save time, as she will be away on vacation).  Paula Compton, MD

## 2012-02-19 ENCOUNTER — Ambulatory Visit (INDEPENDENT_AMBULATORY_CARE_PROVIDER_SITE_OTHER): Payer: 59 | Admitting: Family Medicine

## 2012-02-19 VITALS — BP 120/84 | HR 73 | Ht 63.0 in

## 2012-02-19 DIAGNOSIS — H6123 Impacted cerumen, bilateral: Secondary | ICD-10-CM

## 2012-02-19 DIAGNOSIS — T169XXA Foreign body in ear, unspecified ear, initial encounter: Secondary | ICD-10-CM

## 2012-02-19 DIAGNOSIS — T161XXA Foreign body in right ear, initial encounter: Secondary | ICD-10-CM

## 2012-02-19 DIAGNOSIS — H6120 Impacted cerumen, unspecified ear: Secondary | ICD-10-CM

## 2012-02-20 DIAGNOSIS — T161XXA Foreign body in right ear, initial encounter: Secondary | ICD-10-CM | POA: Insufficient documentation

## 2012-02-20 NOTE — Progress Notes (Signed)
  Subjective:    Patient ID: Jenna Gordon, female    DOB: May 18, 1965, 47 y.o.   MRN: [id number]  HPI Patient worked in today for concern about "stones" in her ear canals.  Was at the beach in her [place] in November (2013), and since then she has found small pebbles on her pillow and has had some decreased audition which is worse in the R ear now.   No ear pain, no suppuration/discharge.  No fevers or chills.   Review of SystemsSee above     Objective:   Physical Exam Well appearing, no apparent distress HEENT Neck supple. L TM is clear, EAC healthy.   R TM clear, several small pieces of gravel are present in the EAC.  No maceration of surrounding skin.  No periauricular nodes. No pain to palpate pinna or tragus.        Assessment & Plan:

## 2012-02-25 IMAGING — CR DG CERVICAL SPINE COMPLETE 4+V
5 series · 5 of 5 positions shown · non-contrast
Comparison: 10/13/2004.  10/29/2004.

CLINICAL DATA: History of trauma and pain.  History of neck
stiffness.

CERVICAL SPINE - COMPLETE 4+ VIEW

[w c-spine lat]
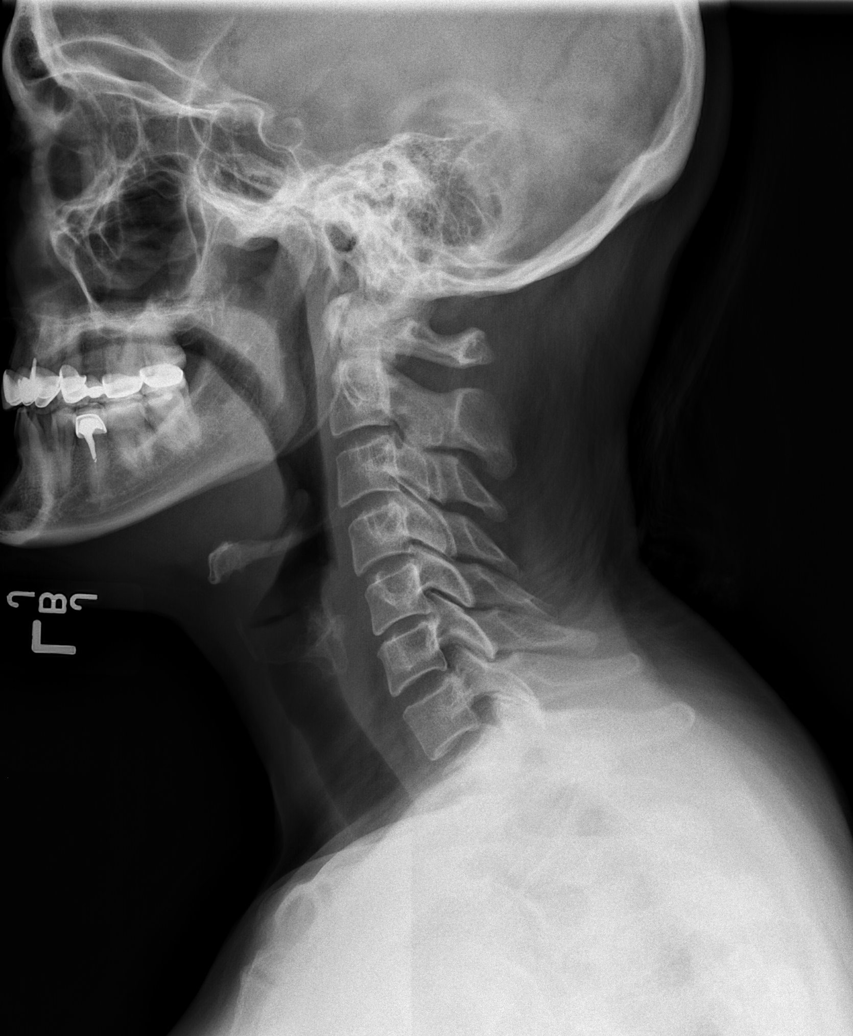

[w c-spine oblique (1 of 2)]
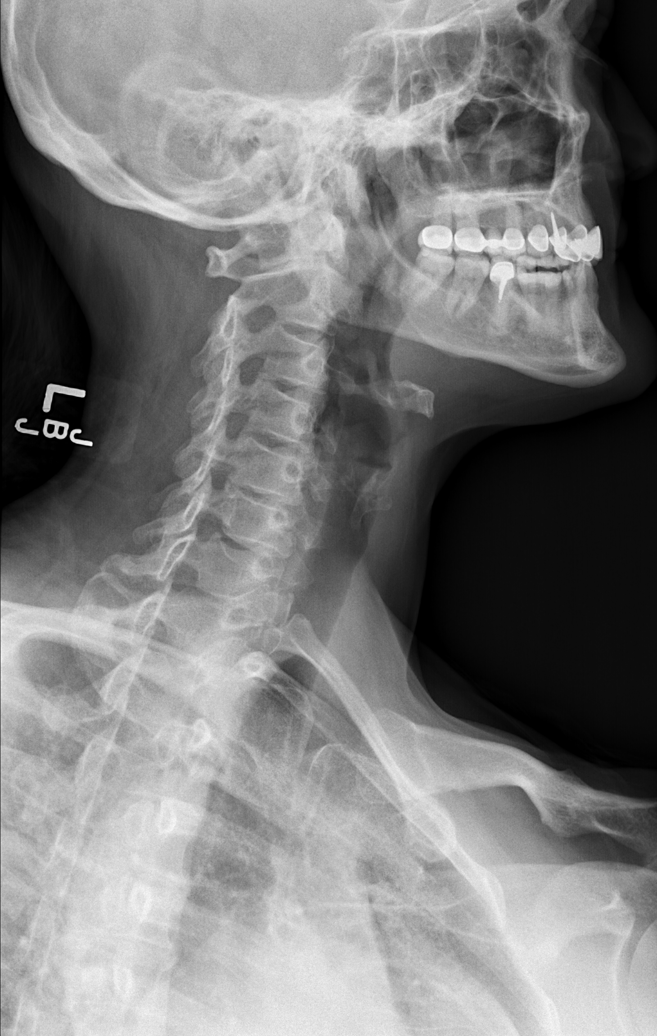

[w c-spine oblique (2 of 2)]
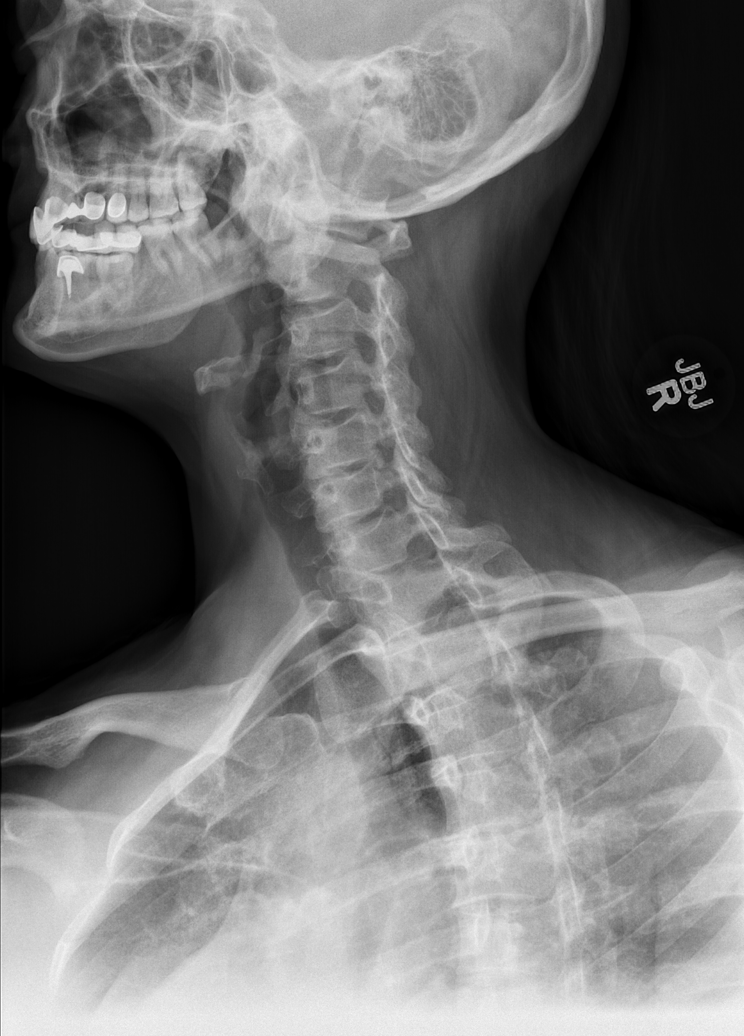

[w c-spine a.p.]
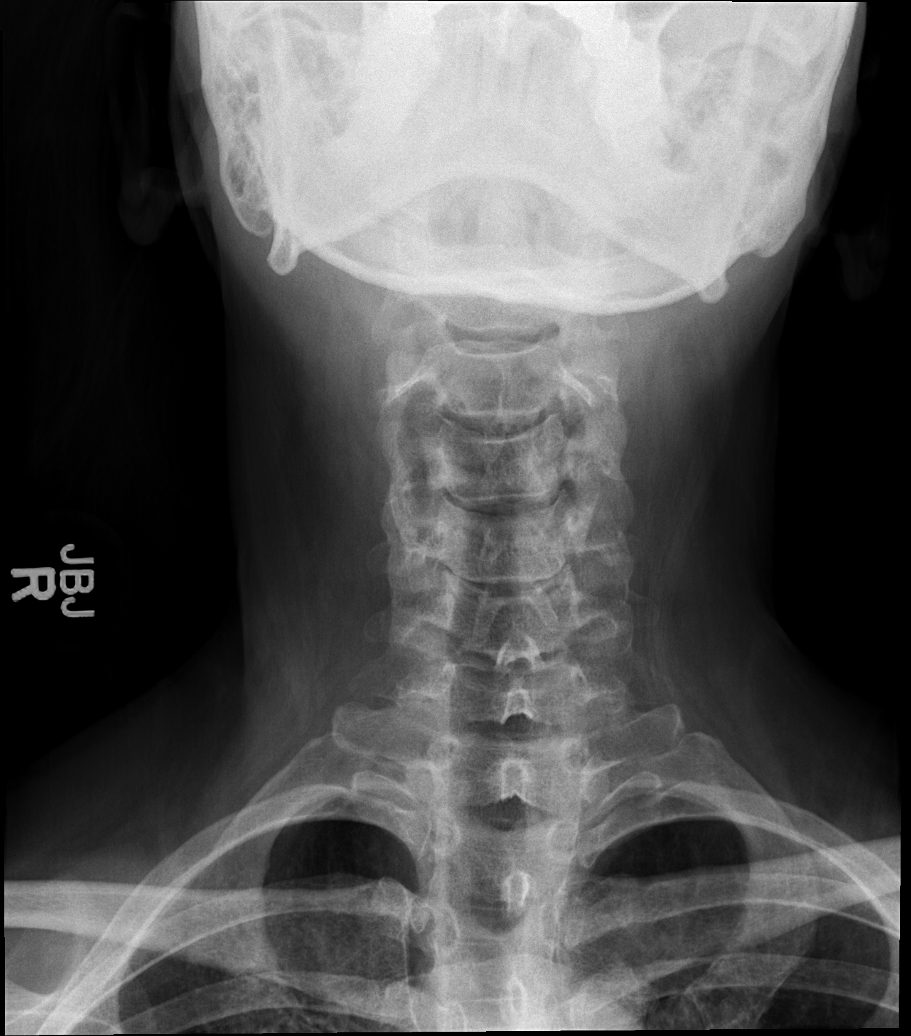

[w c-spine odontoid]
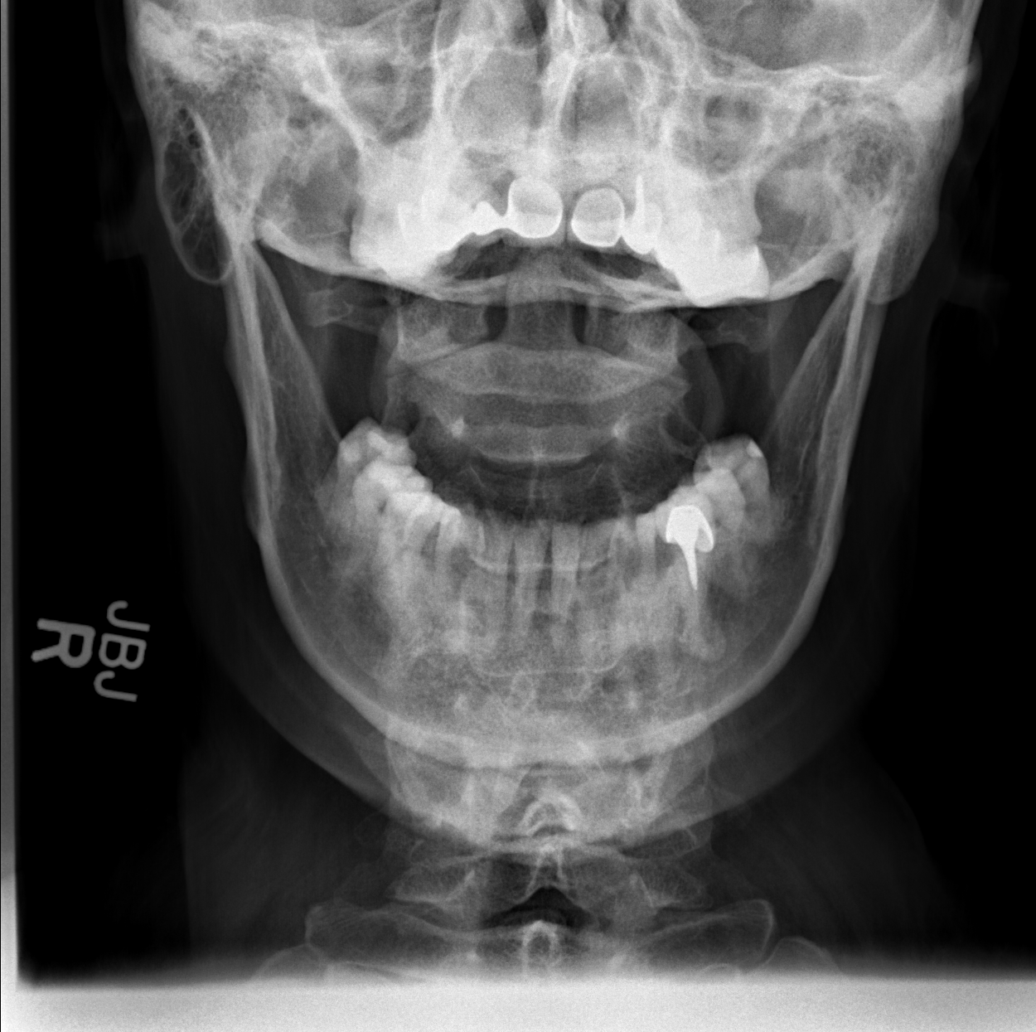

[5 of 5 positions shown; findings below may reference images not displayed]

FINDINGS: There is no evidence of prevertebral soft tissue
swelling.  Alignment is normal.  There is slight narrowing of the
C5-C6 intervertebral disc space.  There is associated marginal
osteophyte formation.  No fracture or subluxation is seen.  There
is uncovertebral spurring with slight foraminal encroachment at the
level of C5-C6 bilaterally, more on the left than the right.  No
cervical ribs are seen.
IMPRESSION: There is evidence of degenerative disc disease and degenerative
spondylosis at C5-C6 level.  See above for details.  No fracture or
subluxation is evident.

## 2012-03-25 ENCOUNTER — Ambulatory Visit: Payer: 59 | Admitting: Family Medicine

## 2012-03-28 ENCOUNTER — Encounter: Payer: 59 | Admitting: Family Medicine

## 2012-04-11 ENCOUNTER — Ambulatory Visit (INDEPENDENT_AMBULATORY_CARE_PROVIDER_SITE_OTHER): Payer: 59 | Admitting: Family Medicine

## 2012-04-11 ENCOUNTER — Other Ambulatory Visit (HOSPITAL_COMMUNITY)
Admission: RE | Admit: 2012-04-11 | Discharge: 2012-04-11 | Disposition: A | Discharge status: Home / Self Care | Payer: 59 | Source: Ambulatory Visit | Attending: Family Medicine | Admitting: Family Medicine

## 2012-04-11 ENCOUNTER — Encounter: Payer: Self-pay | Admitting: Family Medicine

## 2012-04-11 VITALS — BP 122/85 | HR 71 | Ht 63.0 in | Wt 143.0 lb

## 2012-04-11 DIAGNOSIS — R799 Abnormal finding of blood chemistry, unspecified: Secondary | ICD-10-CM

## 2012-04-11 DIAGNOSIS — Z124 Encounter for screening for malignant neoplasm of cervix: Secondary | ICD-10-CM

## 2012-04-11 DIAGNOSIS — R358 Other polyuria: Secondary | ICD-10-CM

## 2012-04-11 DIAGNOSIS — N72 Inflammatory disease of cervix uteri: Secondary | ICD-10-CM

## 2012-04-11 DIAGNOSIS — N898 Other specified noninflammatory disorders of vagina: Secondary | ICD-10-CM

## 2012-04-11 DIAGNOSIS — I7300 Raynaud's syndrome without gangrene: Secondary | ICD-10-CM

## 2012-04-11 DIAGNOSIS — Z113 Encounter for screening for infections with a predominantly sexual mode of transmission: Secondary | ICD-10-CM | POA: Insufficient documentation

## 2012-04-11 DIAGNOSIS — D509 Iron deficiency anemia, unspecified: Secondary | ICD-10-CM

## 2012-04-11 DIAGNOSIS — Z01419 Encounter for gynecological examination (general) (routine) without abnormal findings: Secondary | ICD-10-CM | POA: Insufficient documentation

## 2012-04-11 DIAGNOSIS — Z13220 Encounter for screening for lipoid disorders: Secondary | ICD-10-CM

## 2012-04-11 LAB — POCT URINALYSIS DIPSTICK
Bilirubin, UA: NEGATIVE
Blood, UA: NEGATIVE
Ketones, UA: NEGATIVE
Protein, UA: NEGATIVE
Spec Grav, UA: 1.01
pH, UA: 6

## 2012-04-11 LAB — LIPID PANEL
HDL: 58 mg/dL (ref >39)
LDL Cholesterol: 59 mg/dL (ref 0–99)
Total CHOL/HDL Ratio: 2.6 Ratio
VLDL: 31 mg/dL (ref 0–40)

## 2012-04-11 LAB — CBC
HCT: 38.2 % (ref 36.0–46.0)
Hemoglobin: 12.9 g/dL (ref 12.0–15.0)
MCH: 27.7 pg (ref 26.0–34.0)
MCHC: 33.8 g/dL (ref 30.0–36.0)
MCV: 82 fL (ref 78.0–100.0)

## 2012-04-11 LAB — COMPREHENSIVE METABOLIC PANEL
AST: 13 U/L (ref 0–37)
Albumin: 4.1 g/dL (ref 3.5–5.2)
Alkaline Phosphatase: 73 U/L (ref 39–117)
Potassium: 3.8 mEq/L (ref 3.5–5.3)
Sodium: 137 mEq/L (ref 135–145)
Total Bilirubin: 0.4 mg/dL (ref 0.3–1.2)
Total Protein: 7.2 g/dL (ref 6.0–8.3)

## 2012-04-11 LAB — POCT WET PREP (WET MOUNT)
Clue Cells Wet Prep Whiff POC: NEGATIVE
WBC, Wet Prep HPF POC: <5

## 2012-04-11 NOTE — Assessment & Plan Note (Signed)
Previous diagnosis of iron-deficiency anemia; had resolved according to [facility], who had a Hgb checked by a nurse at work, was around 13.  To recheck today. Of note, she is on prednisone for the past 1 month, which may give leukocytosis.

## 2012-04-11 NOTE — Assessment & Plan Note (Signed)
History of frequent urinary tract infections; today mentions frequency. Her office UA is not suggestive of infection.  Wet prep is similarly unremarkable.  Await result of cervical culture.

## 2012-04-11 NOTE — Assessment & Plan Note (Signed)
Recommended that she mention this to her Rheumatologist in her upcoming follow-up, as this may be associated with secondary RF.  She is being treated with Humira and is on prednisone 5mg  daily for the past 1 month.

## 2012-04-11 NOTE — Progress Notes (Signed)
  Subjective:    Patient ID: Jenna Gordon, female    DOB: [birth year], 47 y.o.   MRN: [id number]  HPI Patient seen for wellness visit, and to address a few issues:  1. Recently has had episodes of numbness and cold fingers in both hands; first affected R middle finger and then L 5th finger, intermittently.  Feels like the finger is asleep.  Gloves did not help.  First episode was in cold weather; most recently was a couple of days ago when the weather was much warmer.  No other associated symptoms.  Of note, patient ANA-positive and under the care of Rheumatologist from [facility], Dr. Francee Gentile.  Treated with Humira; recently has been on prednisone 5mg  daily for the past 1 month.  Has not addressed this hand complaint with her rheumatologist, whom she sees again in April for follow up.  2. Prior history recurrent UTIs, was treated by Dr [facility]) and then referred to Urology afterward.  Currently experiencing frequency and some vaginal irritation without dysuria.    3. Here for PAP smear.  Had abnormal PAP with LEEP procedure in 1993-94, normal since then.   Family Hx; Father died at age 81 yrs with GI cancer (unclear if originated instomach or colon).  No other fam hx colon cancer.No breast cancer in family.    Review of Systems  No weight changes, no fevers/chills, has had bad breath a lot; no GERD symptoms (no heartburn).  No nausea/vomiting, no changes in bowel habits, no blood in stool, no constipation or abdominal pain or bloating.  Urinary ROS as above. Has had post-nasal drip recently.       Objective:   Physical Exam Well appearing, no apparent distress HEENT neck supple, no cervical adenopathy. Clear oropharynx.  COR Regular S1S2, no extra sounds PULM Clear bilaterally, no rales or wheezes ABD Soft, nontender, nondistended.  GYN Normal vaginal mucosa; cervix non-friable.  Scant white discharge. No CMT on bimanual.  No adnexal masses. PAP, wet prep and cervical  cultures collected.        Assessment & Plan:

## 2012-04-16 ENCOUNTER — Encounter: Payer: Self-pay | Admitting: Family Medicine

## 2012-08-27 ENCOUNTER — Ambulatory Visit (INDEPENDENT_AMBULATORY_CARE_PROVIDER_SITE_OTHER): Payer: 59 | Admitting: Family Medicine

## 2012-08-27 VITALS — BP 123/83 | HR 70 | Temp 97.8°F | Wt 142.0 lb

## 2012-08-27 DIAGNOSIS — J029 Acute pharyngitis, unspecified: Secondary | ICD-10-CM | POA: Insufficient documentation

## 2012-08-27 DIAGNOSIS — J020 Streptococcal pharyngitis: Secondary | ICD-10-CM

## 2012-08-27 NOTE — Progress Notes (Signed)
  Subjective:    Patient ID: Jenna Gordon, female    DOB: [birth year], 47 y.o.   MRN: [id number]  Sore Throat    Patient is a 47 yo female who presents with sore throat.  Started a couple of days ago. Mild sore throat. Now hard to swallow. Patient worried it could be strep throat. States no fever, though states is on humira for RA. Eyes watery and burning which she states is a sign of fever. Endorses ear fullness and phlegm with a small streak of blood in it. Denies cough and sinus congestion. Notably her mother tested positive for TB and she is awaiting PPD to be read at health department.   Review of Systems see HPI     Objective:   Physical Exam  Constitutional: She appears well-developed and well-nourished.  HENT:  Head: Normocephalic and atraumatic.  Bilateral TM normal, OP with erythema and no exudate  Eyes: Conjunctivae are normal. Pupils are equal, round, and reactive to light.  Neck: Neck supple.  Cardiovascular: Normal rate, regular rhythm and normal heart sounds.   Pulmonary/Chest: Effort normal and breath sounds normal.  Lymphadenopathy:    She has no cervical adenopathy.  BP 123/83  Pulse 70  Temp(Src) 97.8 F (36.6 C) (Oral)  Wt 142 lb (64.411 kg)  BMI 25.16 kg/m2    Assessment & Plan:

## 2012-08-27 NOTE — Assessment & Plan Note (Signed)
Rapid strep negative. Patient with likely viral URI. Advised supportive care.

## 2012-08-27 NOTE — Patient Instructions (Signed)
Nice to see you. You're strep test was negative. I think you likely have a viral upper respiratory infection. This should get better on its own. If its not better by early next week let us know.

## 2012-08-28 ENCOUNTER — Other Ambulatory Visit: Payer: Self-pay | Admitting: Infectious Diseases

## 2012-08-28 ENCOUNTER — Ambulatory Visit
Admission: RE | Admit: 2012-08-28 | Discharge: 2012-08-28 | Disposition: A | Discharge status: Home / Self Care | Payer: No Typology Code available for payment source | Source: Ambulatory Visit | Attending: Infectious Diseases | Admitting: Infectious Diseases

## 2012-08-28 DIAGNOSIS — Z201 Contact with and (suspected) exposure to tuberculosis: Secondary | ICD-10-CM

## 2012-10-14 ENCOUNTER — Ambulatory Visit (INDEPENDENT_AMBULATORY_CARE_PROVIDER_SITE_OTHER): Payer: Self-pay | Admitting: Family Medicine

## 2012-10-14 ENCOUNTER — Telehealth: Payer: Self-pay | Admitting: *Deleted

## 2012-10-14 ENCOUNTER — Encounter: Payer: Self-pay | Admitting: Family Medicine

## 2012-10-14 VITALS — BP 136/80 | HR 73 | Ht 63.0 in | Wt 141.6 lb

## 2012-10-14 DIAGNOSIS — Z23 Encounter for immunization: Secondary | ICD-10-CM

## 2012-10-14 DIAGNOSIS — R413 Other amnesia: Secondary | ICD-10-CM

## 2012-10-14 DIAGNOSIS — Z87828 Personal history of other (healed) physical injury and trauma: Secondary | ICD-10-CM

## 2012-10-14 HISTORY — DX: Other amnesia: R41.3

## 2012-10-14 HISTORY — DX: Personal history of other (healed) physical injury and trauma: Z87.828

## 2012-10-14 LAB — BASIC METABOLIC PANEL
BUN: 14 mg/dL (ref 6–23)
Chloride: 105 mEq/L (ref 96–112)
Creat: 0.67 mg/dL (ref 0.50–1.10)
Glucose, Bld: 91 mg/dL (ref 70–99)
Potassium: 3.9 mEq/L (ref 3.5–5.3)

## 2012-10-14 LAB — TSH: TSH: 1.736 u[IU]/mL (ref 0.350–4.500)

## 2012-10-14 LAB — VITAMIN B12: Vitamin B-12: 913 pg/mL — ABNORMAL HIGH (ref 211–911)
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
## 2012-10-14 NOTE — Telephone Encounter (Signed)
Called pt. Informed of PA and appt for MRI of brain w/o contrast at [facility]. Arrive at 1:45 pm on Oct 02/2012. Pt verbalized understanding. Lorenda Hatchet, Renato Battles

## 2012-10-14 NOTE — Patient Instructions (Addendum)
It was a pleasure to see you today.    For the memory loss:  1. MRI brain,  2. Labs, including thyroid, folic acid, vitamin B12, metabolic panel. 3. RELEASE OF INFORMATION TWO-WAY WITH DR Verdis Frederickson, PSYCHIATRY.  Flu shot today.   [phone], celular)
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
## 2012-10-15 ENCOUNTER — Telehealth: Payer: Self-pay | Admitting: Family Medicine

## 2012-10-15 NOTE — Telephone Encounter (Signed)
Called patient to report lab results; she has her MRI scheduled for October 2nd.  I also informed her that I had written the letter she and daughter [name] requested on Tiffany's behalf, is at the front desk.  Paula Compton, MD

## 2012-10-23 ENCOUNTER — Ambulatory Visit (HOSPITAL_COMMUNITY)
Admission: RE | Admit: 2012-10-23 | Discharge: 2012-10-23 | Disposition: A | Discharge status: Home / Self Care | Payer: 59 | Source: Ambulatory Visit | Attending: Family Medicine | Admitting: Family Medicine

## 2012-10-23 ENCOUNTER — Other Ambulatory Visit (HOSPITAL_COMMUNITY): Payer: Self-pay

## 2012-10-23 DIAGNOSIS — R413 Other amnesia: Secondary | ICD-10-CM | POA: Insufficient documentation

## 2012-10-23 DIAGNOSIS — S0990XA Unspecified injury of head, initial encounter: Secondary | ICD-10-CM | POA: Insufficient documentation

## 2012-10-23 DIAGNOSIS — X58XXXA Exposure to other specified factors, initial encounter: Secondary | ICD-10-CM | POA: Insufficient documentation

## 2012-10-23 DIAGNOSIS — Z87828 Personal history of other (healed) physical injury and trauma: Secondary | ICD-10-CM

## 2012-10-27 ENCOUNTER — Telehealth: Payer: Self-pay | Admitting: Family Medicine

## 2012-10-27 NOTE — Telephone Encounter (Signed)
Called patient and discussed results of MRI brain.  Her problems with memory loss are most likely related to affective/mood issues, as no organic causes have been identified in our workup thus far.  She will try to make another appointment with her psychiatrist sooner than her scheduled 85-month follow up.  Copies of the MRI report to patient and psychiatrist.  If psychiatrist DR Verdis Frederickson, PSYCHIATRY, feels that this is not related to patient's PTSD/depression history, then to consider Neurology referral.  Patient is in agreement with this plan.  [facility]  [address] [place], [place] [address]  [phone] (Office) [phone] (Fax)

## 2013-04-06 ENCOUNTER — Encounter: Payer: Self-pay | Admitting: Family Medicine

## 2013-04-06 DIAGNOSIS — R7611 Nonspecific reaction to tuberculin skin test without active tuberculosis: Secondary | ICD-10-CM | POA: Insufficient documentation

## 2013-05-19 ENCOUNTER — Encounter (HOSPITAL_BASED_OUTPATIENT_CLINIC_OR_DEPARTMENT_OTHER): Payer: Self-pay | Admitting: *Deleted

## 2013-05-19 NOTE — Progress Notes (Signed)
NPO AFTER MN WITH EXCEPTION CLEAR LIQUIDS UNTIL 0800 (NO CREAM/ MILK PRODUCTS). ARRIVE AT 1245. NEEDS HG AND URINE PREG. 

## 2013-05-21 ENCOUNTER — Ambulatory Visit (HOSPITAL_BASED_OUTPATIENT_CLINIC_OR_DEPARTMENT_OTHER): Payer: 59 | Admitting: Anesthesiology

## 2013-05-21 ENCOUNTER — Encounter (HOSPITAL_BASED_OUTPATIENT_CLINIC_OR_DEPARTMENT_OTHER)
Admission: RE | Disposition: A | Discharge status: Home / Self Care | Payer: Self-pay | Source: Ambulatory Visit | Attending: Orthopedic Surgery

## 2013-05-21 ENCOUNTER — Encounter (HOSPITAL_BASED_OUTPATIENT_CLINIC_OR_DEPARTMENT_OTHER): Payer: 59 | Admitting: Anesthesiology

## 2013-05-21 ENCOUNTER — Encounter (HOSPITAL_BASED_OUTPATIENT_CLINIC_OR_DEPARTMENT_OTHER): Payer: Self-pay

## 2013-05-21 ENCOUNTER — Ambulatory Visit (HOSPITAL_BASED_OUTPATIENT_CLINIC_OR_DEPARTMENT_OTHER)
Admission: RE | Admit: 2013-05-21 | Discharge: 2013-05-21 | Disposition: A | Discharge status: Home / Self Care | Payer: 59 | Source: Ambulatory Visit | Attending: Orthopedic Surgery | Admitting: Orthopedic Surgery

## 2013-05-21 DIAGNOSIS — M6740 Ganglion, unspecified site: Secondary | ICD-10-CM | POA: Insufficient documentation

## 2013-05-21 DIAGNOSIS — M069 Rheumatoid arthritis, unspecified: Secondary | ICD-10-CM | POA: Insufficient documentation

## 2013-05-21 DIAGNOSIS — M65839 Other synovitis and tenosynovitis, unspecified forearm: Secondary | ICD-10-CM | POA: Insufficient documentation

## 2013-05-21 DIAGNOSIS — M65849 Other synovitis and tenosynovitis, unspecified hand: Principal | ICD-10-CM

## 2013-05-21 DIAGNOSIS — Z882 Allergy status to sulfonamides status: Secondary | ICD-10-CM | POA: Insufficient documentation

## 2013-05-21 HISTORY — DX: Dry eye syndrome of bilateral lacrimal glands: H04.123

## 2013-05-21 HISTORY — DX: Nonspecific reaction to tuberculin skin test without active tuberculosis: R76.11

## 2013-05-21 HISTORY — PX: MASS EXCISION: SHX2000

## 2013-05-21 HISTORY — DX: Personal history of other diseases of the nervous system and sense organs: Z86.69

## 2013-05-21 HISTORY — PX: TENOSYNOVECTOMY: SHX6110

## 2013-05-21 HISTORY — DX: Presence of spectacles and contact lenses: Z97.3

## 2013-05-21 HISTORY — DX: Stress incontinence (female) (male): N39.3

## 2013-05-21 LAB — POCT HEMOGLOBIN-HEMACUE: Hemoglobin: 12.8 g/dL (ref 12.0–15.0)

## 2013-05-21 LAB — POCT PREGNANCY, URINE: Preg Test, Ur: NEGATIVE

## 2013-05-21 SURGERY — TENOSYNOVECTOMY
Anesthesia: Monitor Anesthesia Care | Site: Finger | Laterality: Left

## 2013-05-21 MED ORDER — LIDOCAINE HCL (PF) 1 % IJ SOLN
INTRAMUSCULAR | Status: DC | PRN
  Administered 2013-05-21: 10 mL

## 2013-05-21 MED ORDER — HYDROMORPHONE HCL PF 1 MG/ML IJ SOLN
0.2500 mg | INTRAMUSCULAR | Status: DC | PRN
  Filled 2013-05-21: qty 1

## 2013-05-21 MED ORDER — CHLORHEXIDINE GLUCONATE 4 % EX LIQD
60.0000 mL | Freq: Once | CUTANEOUS | Status: DC
  Filled 2013-05-21: qty 60

## 2013-05-21 MED ORDER — DEXAMETHASONE SODIUM PHOSPHATE 4 MG/ML IJ SOLN
INTRAMUSCULAR | Status: DC | PRN
  Administered 2013-05-21: 5 mg via INTRAVENOUS

## 2013-05-21 MED ORDER — LACTATED RINGERS IV SOLN
INTRAVENOUS | Status: DC
  Filled 2013-05-21: qty 1000

## 2013-05-21 MED ORDER — PROMETHAZINE HCL 25 MG/ML IJ SOLN
6.2500 mg | INTRAMUSCULAR | Status: DC | PRN
  Filled 2013-05-21: qty 1

## 2013-05-21 MED ORDER — FENTANYL CITRATE 0.05 MG/ML IJ SOLN
INTRAMUSCULAR | Status: DC | PRN
  Administered 2013-05-21: 50 ug via INTRAVENOUS
  Administered 2013-05-21: 25 ug via INTRAVENOUS

## 2013-05-21 MED ORDER — MIDAZOLAM HCL 2 MG/2ML IJ SOLN
INTRAMUSCULAR | Status: AC
  Filled 2013-05-21: qty 2

## 2013-05-21 MED ORDER — BUPIVACAINE HCL (PF) 0.25 % IJ SOLN
INTRAMUSCULAR | Status: DC | PRN
  Administered 2013-05-21: 10 mL

## 2013-05-21 MED ORDER — PROPOFOL 10 MG/ML IV EMUL
INTRAVENOUS | Status: DC | PRN
  Administered 2013-05-21: 300 ug/kg/min via INTRAVENOUS

## 2013-05-21 MED ORDER — ONDANSETRON HCL 4 MG/2ML IJ SOLN
INTRAMUSCULAR | Status: DC | PRN
  Administered 2013-05-21: 4 mg via INTRAVENOUS

## 2013-05-21 MED ORDER — CEFAZOLIN SODIUM-DEXTROSE 2-3 GM-% IV SOLR
2.0000 g | INTRAVENOUS | Status: AC
  Administered 2013-05-21: 2 g via INTRAVENOUS
  Filled 2013-05-21: qty 50

## 2013-05-21 MED ORDER — FENTANYL CITRATE 0.05 MG/ML IJ SOLN
INTRAMUSCULAR | Status: AC
  Filled 2013-05-21: qty 4

## 2013-05-21 MED ORDER — LACTATED RINGERS IV SOLN
INTRAVENOUS | Status: DC
  Administered 2013-05-21: 13:00:00 via INTRAVENOUS
  Filled 2013-05-21: qty 1000

## 2013-05-21 MED ORDER — DOCUSATE SODIUM 100 MG PO CAPS: 100.0000 mg | ORAL_CAPSULE | Freq: Two times a day (BID) | ORAL | Status: DC

## 2013-05-21 MED ORDER — HYDROCODONE-ACETAMINOPHEN 5-300 MG PO TABS: 1.0000 | ORAL_TABLET | Freq: Four times a day (QID) | ORAL | Status: DC | PRN

## 2013-05-21 MED ORDER — MIDAZOLAM HCL 5 MG/5ML IJ SOLN
INTRAMUSCULAR | Status: DC | PRN
Start: 2013-05-21 — End: 2013-05-21
  Administered 2013-05-21 (×2): 1 mg via INTRAVENOUS

## 2013-05-21 SURGICAL SUPPLY — 77 items
APL SKNCLS STERI-STRIP NONHPOA (GAUZE/BANDAGES/DRESSINGS)
BANDAGE ELASTIC 3 VELCRO ST LF (GAUZE/BANDAGES/DRESSINGS) ×2 IMPLANT
BANDAGE ELASTIC 4 VELCRO ST LF (GAUZE/BANDAGES/DRESSINGS) IMPLANT
BENZOIN TINCTURE PRP APPL 2/3 (GAUZE/BANDAGES/DRESSINGS) IMPLANT
BLADE MINI RND TIP GREEN BEAV (BLADE) IMPLANT
BLADE SURG 15 STRL LF DISP TIS (BLADE) ×1 IMPLANT
BLADE SURG 15 STRL SS (BLADE) ×3
BNDG CMPR 9X4 STRL LF SNTH (GAUZE/BANDAGES/DRESSINGS) ×1
BNDG COHESIVE 3X5 TAN STRL LF (GAUZE/BANDAGES/DRESSINGS) ×2 IMPLANT
BNDG ESMARK 4X9 LF (GAUZE/BANDAGES/DRESSINGS) ×2 IMPLANT
BNDG GAUZE ELAST 4 BULKY (GAUZE/BANDAGES/DRESSINGS) ×3 IMPLANT
CLOSURE WOUND 1/2 X4 (GAUZE/BANDAGES/DRESSINGS)
CLOTH BEACON ORANGE TIMEOUT ST (SAFETY) ×3 IMPLANT
CORDS BIPOLAR (ELECTRODE) ×1 IMPLANT
COVER MAYO STAND STRL (DRAPES) ×1 IMPLANT
COVER TABLE BACK 60X90 (DRAPES) ×3 IMPLANT
CUFF TOURNIQUET SINGLE 18IN (TOURNIQUET CUFF) ×2 IMPLANT
DECANTER SPIKE VIAL GLASS SM (MISCELLANEOUS) IMPLANT
DRAIN PENROSE 18X1/2 LTX STRL (DRAIN) IMPLANT
DRAIN PENROSE 18X1/4 LTX STRL (WOUND CARE) IMPLANT
DRAPE EXTREMITY T 121X128X90 (DRAPE) ×3 IMPLANT
DRAPE SURG 17X23 STRL (DRAPES) ×3 IMPLANT
DRSG EMULSION OIL 3X3 NADH (GAUZE/BANDAGES/DRESSINGS) ×1 IMPLANT
ELECT NDL TIP 2.8 STRL (NEEDLE) IMPLANT
ELECT NEEDLE TIP 2.8 STRL (NEEDLE) IMPLANT
ELECT REM PT RETURN 9FT ADLT (ELECTROSURGICAL)
ELECTRODE REM PT RTRN 9FT ADLT (ELECTROSURGICAL) ×1 IMPLANT
GAUZE SPONGE 4X4 16PLY XRAY LF (GAUZE/BANDAGES/DRESSINGS) IMPLANT
GAUZE XEROFORM 1X8 LF (GAUZE/BANDAGES/DRESSINGS) ×2 IMPLANT
GLOVE BIO SURGEON STRL SZ8 (GLOVE) ×3 IMPLANT
GLOVE BIOGEL PI IND STRL 8.5 (GLOVE) ×1 IMPLANT
GLOVE BIOGEL PI INDICATOR 8.5 (GLOVE) ×2
GLOVE INDICATOR 7.0 STRL GRN (GLOVE) ×2 IMPLANT
GLOVE SURG ORTHO 8.0 STRL STRW (GLOVE) ×3 IMPLANT
GLOVE SURG SS PI 7.5 STRL IVOR (GLOVE) ×2 IMPLANT
GOWN STRL REUS W/ TWL XL LVL3 (GOWN DISPOSABLE) ×1 IMPLANT
GOWN STRL REUS W/TWL XL LVL3 (GOWN DISPOSABLE) ×9 IMPLANT
LOOP VESSEL MAXI BLUE (MISCELLANEOUS) IMPLANT
NDL HYPO 25X1 1.5 SAFETY (NEEDLE) IMPLANT
NEEDLE 27GAX1X1/2 (NEEDLE) IMPLANT
NEEDLE HYPO 25X1 1.5 SAFETY (NEEDLE) ×6 IMPLANT
NS IRRIG 500ML POUR BTL (IV SOLUTION) ×3 IMPLANT
PACK BASIN DAY SURGERY FS (CUSTOM PROCEDURE TRAY) ×3 IMPLANT
PAD CAST 3X4 CTTN HI CHSV (CAST SUPPLIES) ×1 IMPLANT
PAD CAST 4YDX4 CTTN HI CHSV (CAST SUPPLIES) IMPLANT
PADDING CAST ABS 3INX4YD NS (CAST SUPPLIES) ×2
PADDING CAST ABS 4INX4YD NS (CAST SUPPLIES) ×2
PADDING CAST ABS COTTON 3X4 (CAST SUPPLIES) IMPLANT
PADDING CAST ABS COTTON 4X4 ST (CAST SUPPLIES) ×1 IMPLANT
PADDING CAST COTTON 3X4 STRL (CAST SUPPLIES) ×3
PADDING CAST COTTON 4X4 STRL (CAST SUPPLIES)
PENCIL BUTTON HOLSTER BLD 10FT (ELECTRODE) IMPLANT
SOAP 2% CHG 32OZ (WOUND CARE) ×3 IMPLANT
SPLINT FIBERGLASS 3X35 (CAST SUPPLIES) IMPLANT
SPLINT FIBERGLASS 4X30 (CAST SUPPLIES) IMPLANT
SPLINT PLASTER CAST XFAST 3X15 (CAST SUPPLIES) IMPLANT
SPLINT PLASTER CAST XFAST 4X15 (CAST SUPPLIES) IMPLANT
SPLINT PLASTER XTRA FAST SET 4 (CAST SUPPLIES)
SPLINT PLASTER XTRA FASTSET 3X (CAST SUPPLIES)
SPONGE GAUZE 4X4 12PLY (GAUZE/BANDAGES/DRESSINGS) ×3 IMPLANT
STOCKINETTE 4X48 STRL (DRAPES) ×3 IMPLANT
STRIP CLOSURE SKIN 1/2X4 (GAUZE/BANDAGES/DRESSINGS) IMPLANT
SUCTION FRAZIER TIP 10 FR DISP (SUCTIONS) IMPLANT
SUT ETHILON 4 0 P 3 18 (SUTURE) ×4 IMPLANT
SUT ETHILON 5 0 P 3 18 (SUTURE)
SUT MNCRL AB 3-0 PS2 18 (SUTURE) IMPLANT
SUT MNCRL AB 4-0 PS2 18 (SUTURE) ×2 IMPLANT
SUT NYLON ETHILON 5-0 P-3 1X18 (SUTURE) ×4 IMPLANT
SUT PROLENE 4 0 PS 2 18 (SUTURE) ×4 IMPLANT
SYR BULB 3OZ (MISCELLANEOUS) ×3 IMPLANT
SYR CONTROL 10ML LL (SYRINGE) ×4 IMPLANT
TOWEL OR 17X24 6PK STRL BLUE (TOWEL DISPOSABLE) ×6 IMPLANT
TRAY DSU PREP LF (CUSTOM PROCEDURE TRAY) ×3 IMPLANT
TUBE CONNECTING 12'X1/4 (SUCTIONS)
TUBE CONNECTING 12X1/4 (SUCTIONS) IMPLANT
UNDERPAD 30X30 INCONTINENT (UNDERPADS AND DIAPERS) ×3 IMPLANT
WATER STERILE IRR 500ML POUR (IV SOLUTION) ×1 IMPLANT

## 2013-05-21 NOTE — Brief Op Note (Signed)
05/21/2013  2:07 PM  PATIENT:  Jenna Gordon  48 y.o. female  PRE-OPERATIVE DIAGNOSIS:  LEFT INDEX FINGER STENOSING TENOSYNOVITIS, LEFT LONG FINGER MASS  POST-OPERATIVE DIAGNOSIS: SAME  PROCEDURE:  Procedure(s): LEFT INDEX FINGER TENOSYNOVECTOMY (Left) EXCISION MASS LEFT LONG FINGER (Left)  SURGEON:  Surgeon(s) and Role:    * Linna Hoff, MD - Primary  PHYSICIAN ASSISTANT:   ASSISTANTS: none   ANESTHESIA:   MAC  EBL:     BLOOD ADMINISTERED:none  DRAINS: none   LOCAL MEDICATIONS USED:  MARCAINE     SPECIMEN:  No Specimen  DISPOSITION OF SPECIMEN:  N/A  COUNTS:  YES  TOURNIQUET:    DICTATION: .[id number]  PLAN OF CARE: Discharge to home after PACU  PATIENT DISPOSITION:  PACU - hemodynamically stable.   Delay start of Pharmacological VTE agent (>24hrs) due to surgical blood loss or risk of bleeding: not applicable

## 2013-05-21 NOTE — H&P (Signed)
Jenna Gordon is an 48 y.o. female.   Chief Complaint: left index finger pain and left long finger mass HPI: Pt followed in office Pt here for surgery on left index finger and left long finger Pt elects surgery after nonsurgical treatment has not resolved issues No prior surgery to left index finger or left long finger  Past Medical History  Diagnosis Date  . Rheumatoid arthritis(714.0)     Dr. Dondra Spry, [facility]  . 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 History of glaucoma     s/p   bilateral eye laser treatment  2007  . SUI (stress urinary incontinence, female)   . Wears glasses   . Dry eyes     Past Surgical History  Procedure Laterality Date  . Hysteroscopy w/d&c  03-02-2010  . Shoulder arthroscopy with open rotator cuff repair Right 2012  . Trigger finger release  2009    left middle finger  . Carpal tunnel release Bilateral 2007  . Glaucoma surgery Bilateral 2007    laser  . Cesarean section  x2  last one 1999  . Tympanoplasty Left 1993    History reviewed. No pertinent family history. Social History:  reports that she has never smoked. She has never used smokeless tobacco. She reports that she does not drink alcohol or use illicit drugs.  Allergies:  Allergies  Allergen Reactions  . Sulfa Antibiotics Hives    Medications Prior to Admission  Medication Sig Dispense Refill  . b complex vitamins tablet Take 1 tablet by mouth daily.      . Cholecalciferol (VITAMIN D3) 2000 UNITS TABS Take 1 capsule by mouth daily.      . folic acid (FOLVITE) 1 MG tablet Take 1 mg by mouth daily.        Marland Kitchen HYDROcodone-acetaminophen (NORCO/VICODIN) 5-325 MG per tablet Take 1 tablet by mouth every 6 (six) hours as needed for moderate pain.      Marland Kitchen inFLIXimab in sodium chloride 0.9 % Inject into the vein every  30 (thirty) days.      . ISONIAZID PO Take 1 tablet by mouth daily. POSITIVE  TB TEST -- NO ACTIVE --  TAKING FOR PRECAUTION  DUE TO TREATMENT FOR RA      . methotrexate (RHEUMATREX) 2.5 MG tablet Take 10 mg by mouth once a week. Caution:Chemotherapy. Protect from light.---  Takes on thursday's      . Multiple Vitamin (MULTIVITAMIN) tablet Take 1 tablet by mouth daily.      . Omega-3 Fatty Acids (FISH OIL) 1000 MG CAPS Take 1 capsule by mouth daily.      Marland Kitchen PRESCRIPTION MEDICATION Take 1 tablet by mouth daily. Birth control pill        Results for orders placed during the hospital encounter of 05/21/13 (from the past 48 hour(s))  POCT PREGNANCY, URINE     Status: None   Collection Time    05/21/13 12:44 PM      Result Value Ref Range   Preg Test, Ur NEGATIVE  NEGATIVE   Comment:            THE SENSITIVITY OF THIS     METHODOLOGY IS >24 mIU/mL  POCT HEMOGLOBIN-HEMACUE     Status: None   Collection Time    05/21/13  1:33 PM      Result Value Ref Range   Hemoglobin 12.8  12.0 - 15.0 g/dL   No results found.  ROS NO RECENT ILLNESSES OR HOSPITALIZATIONS  Blood pressure 111/72, pulse 66, temperature 97.5 F (36.4 C), temperature source Oral, resp. rate 16, height 5\' 3"  (1.6 m), weight 70.081 kg (154 lb 8 oz), last menstrual period 05/14/2013, SpO2 100.00%. Physical Exam  General Appearance:  Alert, cooperative, no distress, appears stated age  Head:  Normocephalic, without obvious abnormality, atraumatic  Eyes:  Pupils equal, conjunctiva/corneas clear,         Throat: Lips, mucosa, and tongue normal; teeth and gums normal  Neck: No visible masses     Lungs:   respirations unlabored  Chest Wall:  No tenderness or deformity  Heart:  Regular rate and rhythm,  Abdomen:   Soft, non-tender,         Extremities: LEFT HAND: + TRIGGERING TO LEFT INDEX FINGER SOFT TISSUE MASS OVER DORSUM OF LEFT PIP JOINT O/W GOOD DIGITAL MOTION AND USE OF HAND FINGERS WARM WELL PERFUSED GOOD STRENGTH  IN HAND  Pulses: 2+ and symmetric  Skin: Skin color, texture, turgor normal, no rashes or lesions     Neurologic: Normal    Assessment/Plan LEFT INDEX FINGER STENOSING TENOSYNOVITIS LEFT LONG FINGER DORSAL MASS  LEFT INDEX FINGER TENOSYNOVECTOMY AND LEFT LONG FINGER DEEP MASS EXCISION  R/B/A DISCUSSED WITH PT IN OFFICE.  PT VOICED UNDERSTANDING OF PLAN CONSENT SIGNED DAY OF SURGERY PT SEEN AND EXAMINED PRIOR TO OPERATIVE PROCEDURE/DAY OF SURGERY SITE MARKED. QUESTIONS ANSWERED WILL [facility] FOLLOWING SURGERY  Janelle Floor [facility] 05/21/2013, 2:04 PM

## 2013-05-21 NOTE — Transfer of Care (Signed)
Immediate Anesthesia Transfer of Care Note  Patient: Jenna Gordon  Procedure(s) Performed: Procedure(s) (LRB): LEFT INDEX FINGER TENOSYNOVECTOMY (Left) EXCISION MASS LEFT LONG FINGER (Left)  Patient Location: PACU  Anesthesia Type:MAC  Level of Consciousness: awake, alert  and oriented  Airway & Oxygen Therapy: Patient Spontanous Breathing and Patient connected to face mask oxygen  Post-op Assessment: Report given to PACU RN and Post -op Vital signs reviewed and stable  Post vital signs: Reviewed and stable  Complications: No apparent anesthesia complications

## 2013-05-21 NOTE — Discharge Instructions (Signed)
KEEP BANDAGE CLEAN AND DRY °CALL OFFICE FOR F/U APPT [phone] IN 14 DAYS °KEEP HAND ELEVATED ABOVE HEART °OK TO APPLY ICE TO OPERATIVE AREA °CONTACT OFFICE IF ANY WORSENING PAIN OR CONCERNS. ° ° ° °      HAND SURGERY ° °  HOME CARE INSTRUCTIONS ° ° ° °The following instructions have been prepared to help you care for yourself upon your return home today. ° °Wound Care:  °Keep your hand elevated above the level of your heart. Do not allow it to dangle by your side. Keep the dressing dry and do not remove it unless your doctor advises you to do so. He will usually change it at the time of you post-op visit. Moving your fingers is advised to stimulate circulation but will depend on the site of your surgery. Of course, if you have a splint applied your doctor will advise you about movement. ° °Activity:  °Do not drive or operate machinery today. Rest today and then you may return to your normal activity and work as indicated by your physician. ° °Diet: °Drink liquids today or eat a light diet. You may resume a regular diet tomorrow. ° °General expectations: °Pain for two or three days. °Fingers may become slightly swollen.  ° °Unexpected Observations- Call your doctor if any of these occur: °Severe pain not relieved by pain medication. °Elevated temperature. °Dressing soaked with blood. °Inability to move fingers. °White or bluish color to fingers. ° ° ° ° °Post Anesthesia Home Care Instructions ° °Activity: °Get plenty of rest for the remainder of the day. A responsible adult should stay with you for 24 hours following the procedure.  °For the next 24 hours, DO NOT: °-Drive a car °-Operate machinery °-Drink alcoholic beverages °-Take any medication unless instructed by your physician °-Make any legal decisions or sign important papers. ° °Meals: °Start with liquid foods such as gelatin or soup. Progress to regular foods as tolerated. Avoid greasy, spicy, heavy foods. If nausea and/or vomiting occur, drink only clear  liquids until the nausea and/or vomiting subsides. Call your physician if vomiting continues. ° °Special Instructions/Symptoms: °Your throat may feel dry or sore from the anesthesia or the breathing tube placed in your throat during surgery. If this causes discomfort, gargle with warm salt water. The discomfort should disappear within 24 hours. ° ° ° ° ° °

## 2013-05-21 NOTE — Anesthesia Preprocedure Evaluation (Signed)
Anesthesia Evaluation  Patient identified by MRN, date of birth, ID band Patient awake    Reviewed: Allergy & Precautions, H&P , NPO status , Patient's Chart, lab work & pertinent test results  Airway Mallampati: II TM Distance: >3 FB Neck ROM: Full    Dental  (+) Caps, Dental Advisory Given   Pulmonary  +TB test breath sounds clear to auscultation  Pulmonary exam normal       Cardiovascular negative cardio ROS  Rhythm:Regular Rate:Normal     Neuro/Psych negative neurological ROS  negative psych ROS   GI/Hepatic negative GI ROS, Neg liver ROS,   Endo/Other  negative endocrine ROS  Renal/GU negative Renal ROS  negative genitourinary   Musculoskeletal  (+) Arthritis -,   Abdominal   Peds  Hematology  (+) anemia ,   Anesthesia Other Findings   Reproductive/Obstetrics negative OB ROS                           Anesthesia Physical Anesthesia Plan  ASA: II  Anesthesia Plan: MAC   Post-op Pain Management:    Induction: Intravenous  Airway Management Planned: Simple Face Mask  Additional Equipment:   Intra-op Plan:   Post-operative Plan: Extubation in OR  Informed Consent: I have reviewed the patients History and Physical, chart, labs and discussed the procedure including the risks, benefits and alternatives for the proposed anesthesia with the patient or authorized representative who has indicated his/her understanding and acceptance.   Dental advisory given  Plan Discussed with: CRNA  Anesthesia Plan Comments:         Anesthesia Quick Evaluation

## 2013-05-22 NOTE — Anesthesia Postprocedure Evaluation (Signed)
Anesthesia Post Note  Patient: Jenna Gordon  Procedure(s) Performed: Procedure(s) (LRB): LEFT INDEX FINGER TENOSYNOVECTOMY (Left) EXCISION MASS LEFT LONG FINGER (Left)  Anesthesia type: MAC  Patient location: PACU  Post pain: Pain level controlled  Post assessment: Post-op Vital signs reviewed  Last Vitals:  Filed Vitals:   05/21/13 1558  BP: 126/81  Pulse: 57  Temp: 36.1 C  Resp: 16    Post vital signs: Reviewed  Level of consciousness: sedated  Complications: No apparent anesthesia complications

## 2013-05-25 ENCOUNTER — Encounter (HOSPITAL_BASED_OUTPATIENT_CLINIC_OR_DEPARTMENT_OTHER): Payer: Self-pay | Admitting: Orthopedic Surgery

## 2013-05-25 NOTE — Op Note (Signed)
Jenna Gordon, Jenna Gordon NO.:  [id number]  MEDICAL RECORD NO.:  [id number]  LOCATION:                                 FACILITY:  PHYSICIAN:  Melrose Nakayama, MD  DATE OF BIRTH:  06-Jan-1966  DATE OF PROCEDURE:  05/21/2013 DATE OF DISCHARGE:  05/21/2013                              OPERATIVE REPORT   PREOPERATIVE DIAGNOSES: 1. Left index finger stenosing tenosynovitis. 2. Left long finger dorsal mass.  POSTOPERATIVE DIAGNOSES: 1. Left index finger stenosing tenosynovitis. 2. Left long finger dorsal mass.  ATTENDING PHYSICIAN:  Melrose Nakayama, MD who scrubbed and present for the entire procedure.  ASSISTANT SURGEON:  None.  ANESTHESIA:  A 1% Xylocaine, 0.25% Marcaine, local block with IV sedation.  SURGICAL PROCEDURES: 1. Left index finger tenosynovectomy, FDS. 2. Left index finger tenosynovectomy of the flexor digitorum     profundus. 3. Left long finger, deep mass excision less than 1.5 cm. 4. Left long finger, proximal interphalangeal joint, joint arthrotomy     and exploration.  SURGICAL INDICATIONS:  Jenna Gordon is a right-hand-dominant female who had persistent mass over the dorsal aspect of the long finger as well as persistent pain and discomfort in the left index finger.  Her exam findings consistent with the above diagnoses.  The patient had failed nonsurgical intervention and elects to undergo the above procedure. Risks, benefits, and alternatives were discussed in detail with the patient and a signed informed consent was obtained.  Risks include, but not limited to bleeding, infection, damage to nearby nerves, arteries, or tendons, loss motion of wrist and digits, incomplete relief of symptoms, and need for further surgical intervention.  DESCRIPTION OF PROCEDURE:  The patient was properly identified in the preoperative holding area and marked with a permanent marker made on the left index finger and long finger to indicate correct  operative site. The patient was then brought back to the operating room, placed supine on the anesthesia room table where the IV sedation was administered. The patient tolerated this well.  A well-padded tourniquet was then placed on the left brachium sealed with 1000 drape.  Local anesthetic was administered into the long and index finger.  The left upper extremity was then prepped and draped in normal sterile fashion.  Time- out was called.  Correct site was identified, and procedure then begun. Attention was then turned to left index finger.  An oblique incision was made directly over the A1 pulley.  The limb was elevated and tourniquet insufflated.  Dissection was carried out through skin and subcutaneous tissues.  The A1 pulley was then carefully identified and released both proximally distally.  The patient did have marked inflammatory tenosynovitis along both the FDS and FDP and tenosynovectomy, both tendons were then carried out in the palm extending into the digits. Aggressive tenosynovectomy was then carried out decompressing the flexor sheath.  Following this, the wound was then thoroughly irrigated.  After tenosynovectomy both did tendons, skin was then closed using horizontal mattress Prolene sutures.  Attention was then turned to long finger through a separate incision.  A longitudinal incision was made directly over the dorsal aspect of the long finger.  Dissection  was carried down through the skin and subcutaneous tissue.  A small incision was then made in the extensor mechanism, opening up the joint.  The mass was then encountered around the joint.  Inflammatory changes were noted within the joint.  Proximal interphalangeal and arthrotomy and exploration was then carried out.  Following this, debridement and removal of the mass was then carried out less than 1.5 cm within the joint.  The wound was then thoroughly irrigated.  The arthrotomy was then closed with  Monocryl suture, and skin closed with simple Prolene sutures.  Xeroform dressing, sterile compressive bandage then applied.  The patient tolerated the procedure well, returned to recovery room in good condition.  Intraoperative specimens, dorsal mass to Pathology.  POSTPROCEDURE PLAN:  The patient was discharged to home, seen back in the office in approximately 2 weeks for wound check, suture removal, go with the Pathology and then gradually use and activity.     Melrose Nakayama, MD     FWO/MEDQ  D:  05/21/2013  T:  05/22/2013  Job:  [id number]

## 2013-09-21 ENCOUNTER — Encounter: Payer: Self-pay | Admitting: Family Medicine

## 2013-09-21 ENCOUNTER — Ambulatory Visit (INDEPENDENT_AMBULATORY_CARE_PROVIDER_SITE_OTHER): Payer: 59 | Admitting: Family Medicine

## 2013-09-21 VITALS — BP 121/69 | HR 66 | Temp 98.0°F | Ht 63.0 in | Wt 140.4 lb

## 2013-09-21 DIAGNOSIS — L255 Unspecified contact dermatitis due to plants, except food: Secondary | ICD-10-CM

## 2013-09-21 DIAGNOSIS — L237 Allergic contact dermatitis due to plants, except food: Secondary | ICD-10-CM

## 2013-09-21 MED ORDER — HYDROXYZINE HCL 25 MG PO TABS: 25.0000 mg | ORAL_TABLET | Freq: Three times a day (TID) | ORAL | Status: DC | PRN

## 2013-09-21 MED ORDER — PREDNISONE 20 MG PO TABS: ORAL_TABLET | ORAL | Status: DC

## 2013-09-21 NOTE — Assessment & Plan Note (Addendum)
Patient prescribed extended course steroid taper prednisone 60 mg x1 week, 40 mg x1 week, 20 mg x3 days, 10 mg x4 days. Vistaril for itch, patient advised this will cause drowsiness. Patient can continue over-the-counter drying/topical applications for itch intervention. Patient advised to followup in 2 weeks, or sooner if needed.

## 2013-09-21 NOTE — Patient Instructions (Signed)
[facility] is an inflammation of the skin (contact dermatitis). It is caused by contact with the allergens on the leaves of the oak (toxicodendron) plants. Depending on your sensitivity, the rash may consist simply of redness and itching, or it may also progress to blisters which may break open (rupture). These must be well cared for to prevent secondary germ (bacterial) infection as these infections can lead to scarring. The eyes may also get puffy. The puffiness is worst in the morning and gets better as the day progresses. Healing is best accomplished by keeping any open areas dry, clean, covered with a bandage, and covered with an antibacterial ointment if needed. Without secondary infection, this dermatitis usually heals without scarring within 2 to 3 weeks without treatment. HOME CARE INSTRUCTIONS When you have been exposed to poison oak, it is very important to thoroughly wash with soap and water as soon as the exposure has been discovered. You have about one half hour to remove the plant resin before it will cause the rash. This cleaning will quickly destroy the oil or antigen on the skin (the antigen is what causes the rash). Wash aggressively under the fingernails as any plant resin still there will continue to spread the rash. Do not rub skin vigorously when washing affected area. Poison oak cannot spread if no oil from the plant remains on your body. Rash that has progressed to weeping sores (lesions) will not spread the rash unless you have not washed thoroughly. It is also important to clean any clothes you have been wearing as they may carry active allergens which will spread the rash, even several days later. Avoidance of the plant in the future is the best measure. Poison oak plants can be recognized by the number of leaves. Generally, poison oak has three leaves with flowering branches on a single stem. Diphenhydramine may be purchased over the counter and used as needed for  itching. Do not drive with this medication if it makes you drowsy. Ask your caregiver about medication for children. SEEK IMMEDIATE MEDICAL CARE IF:   Open areas of the rash develop.  You notice redness extending beyond the area of the rash.  There is a pus like discharge.  There is increased pain.  Other signs of infection develop (such as fever). Document Released: 07/15/2002 Document Revised: 04/02/2011 Document Reviewed: 11/24/2008 [facility] Patient Information 2015 [place], [place]. This information is not intended to replace advice given to you by your health care provider. Make sure you discuss any questions you have with your health care provider.   Soap name is Fels Naptha wash with this a few times a day over area. Caution not to get this in to eyes.  Steroid taper pack given (extended dose length). Vistiril/atarax given for itch, this will cause drowsiness.  F/u 2 weeks

## 2013-09-21 NOTE — Progress Notes (Signed)
   Subjective:    Patient ID: Jenna Gordon, female    DOB: 10/22/1965, 48 y.o.   MRN: [id number]  HPI Ludie Hudon is a 48 y.o. female presents to same-day clinic for rash.  Rash: Patient states that she was mowing the grass and cutting down wheeze approximately 1 week ago. Within a day she noticed a pruritic/blister that was forming in her right anti-cubital space. Since that time, she states that the rash has erupted over her right arm, abdomen, chest, bilateral legs and now for head. She has never had poison ivy, oak or sumac. She denies any fever. She has attempted to use topical calamine lotion, Benadryl and other over-the-counter creams without relief. She is also attempted an oral over-the-counter allergy medication, without relief. She has arthritis and had a prednisone pack at home, she attempted to use this without any relief, and recently rash became worse after ending pack.   Allergies: Patient is allergic to sulfa medications. PMH: Nonsmoker  Review of Systems Per history of present illness    Objective:   Physical Exam BP 121/69  Pulse 66  Temp(Src) 98 F (36.7 C) (Oral)  Ht 5\' 3"  (1.6 m)  Wt 140 lb 6.4 oz (63.685 kg)  BMI 24.88 kg/m2 Gen: Pleasant, cooperative female. No acute distress, nontoxic in appearance. HEENT: AT. [place]. Bilateral eyes without injections or icterus. MMM. For head with mild maculopapular rash.  Skin: Blotchy, erythemic raised rash forearm, right antecubital space, bicep, chest, abdomen, bilateral legs. Mild eruption on for head. Eyes, nose and mouth spared.    Assessment & Plan:

## 2013-10-02 ENCOUNTER — Telehealth: Payer: Self-pay | Admitting: Family Medicine

## 2013-10-02 DIAGNOSIS — L237 Allergic contact dermatitis due to plants, except food: Secondary | ICD-10-CM

## 2013-10-02 MED ORDER — HYDROXYZINE HCL 25 MG PO TABS: 25.0000 mg | ORAL_TABLET | Freq: Three times a day (TID) | ORAL | Status: DC | PRN

## 2013-10-02 NOTE — Telephone Encounter (Signed)
Pt called and needs a refill on her allergy medication Hydroxyzine called in. jw

## 2013-10-06 ENCOUNTER — Ambulatory Visit: Payer: 59 | Admitting: Family Medicine

## 2013-12-08 ENCOUNTER — Other Ambulatory Visit (HOSPITAL_COMMUNITY): Payer: Self-pay | Admitting: Obstetrics

## 2013-12-08 DIAGNOSIS — Z1231 Encounter for screening mammogram for malignant neoplasm of breast: Secondary | ICD-10-CM

## 2013-12-10 ENCOUNTER — Ambulatory Visit (HOSPITAL_COMMUNITY)
Admission: RE | Admit: 2013-12-10 | Discharge: 2013-12-10 | Disposition: A | Discharge status: Home / Self Care | Payer: 59 | Source: Ambulatory Visit | Attending: Obstetrics | Admitting: Obstetrics

## 2013-12-10 DIAGNOSIS — Z1231 Encounter for screening mammogram for malignant neoplasm of breast: Secondary | ICD-10-CM | POA: Diagnosis not present

## 2014-05-31 ENCOUNTER — Ambulatory Visit (INDEPENDENT_AMBULATORY_CARE_PROVIDER_SITE_OTHER): Payer: No Typology Code available for payment source | Admitting: Family Medicine

## 2014-05-31 ENCOUNTER — Encounter (HOSPITAL_COMMUNITY): Payer: Self-pay | Admitting: Radiology

## 2014-05-31 ENCOUNTER — Inpatient Hospital Stay (HOSPITAL_COMMUNITY): Payer: No Typology Code available for payment source

## 2014-05-31 ENCOUNTER — Encounter: Payer: Self-pay | Admitting: Family Medicine

## 2014-05-31 ENCOUNTER — Inpatient Hospital Stay (HOSPITAL_COMMUNITY)
Admission: AD | Admit: 2014-05-31 | Discharge: 2014-06-03 | DRG: 155 | Disposition: A | Discharge status: Home / Self Care | Payer: No Typology Code available for payment source | Source: Ambulatory Visit | Attending: Family Medicine | Admitting: Family Medicine

## 2014-05-31 VITALS — BP 119/78 | HR 69 | Temp 97.8°F | Wt 141.0 lb

## 2014-05-31 DIAGNOSIS — J340 Abscess, furuncle and carbuncle of nose: Principal | ICD-10-CM | POA: Diagnosis present

## 2014-05-31 DIAGNOSIS — R7611 Nonspecific reaction to tuberculin skin test without active tuberculosis: Secondary | ICD-10-CM | POA: Diagnosis present

## 2014-05-31 DIAGNOSIS — Z87820 Personal history of traumatic brain injury: Secondary | ICD-10-CM | POA: Diagnosis not present

## 2014-05-31 DIAGNOSIS — M3500 Sicca syndrome, unspecified: Secondary | ICD-10-CM | POA: Diagnosis present

## 2014-05-31 DIAGNOSIS — Z79899 Other long term (current) drug therapy: Secondary | ICD-10-CM | POA: Diagnosis not present

## 2014-05-31 DIAGNOSIS — Z7952 Long term (current) use of systemic steroids: Secondary | ICD-10-CM | POA: Diagnosis not present

## 2014-05-31 DIAGNOSIS — J029 Acute pharyngitis, unspecified: Secondary | ICD-10-CM | POA: Diagnosis not present

## 2014-05-31 DIAGNOSIS — Z882 Allergy status to sulfonamides status: Secondary | ICD-10-CM

## 2014-05-31 DIAGNOSIS — I888 Other nonspecific lymphadenitis: Secondary | ICD-10-CM | POA: Diagnosis present

## 2014-05-31 DIAGNOSIS — L03211 Cellulitis of face: Secondary | ICD-10-CM | POA: Diagnosis not present

## 2014-05-31 DIAGNOSIS — L309 Dermatitis, unspecified: Secondary | ICD-10-CM | POA: Diagnosis present

## 2014-05-31 DIAGNOSIS — D509 Iron deficiency anemia, unspecified: Secondary | ICD-10-CM | POA: Diagnosis present

## 2014-05-31 DIAGNOSIS — K0510 Chronic gingivitis, plaque induced: Secondary | ICD-10-CM | POA: Diagnosis present

## 2014-05-31 DIAGNOSIS — M069 Rheumatoid arthritis, unspecified: Secondary | ICD-10-CM | POA: Diagnosis present

## 2014-05-31 DIAGNOSIS — Z79891 Long term (current) use of opiate analgesic: Secondary | ICD-10-CM | POA: Diagnosis not present

## 2014-05-31 LAB — CBC WITH DIFFERENTIAL/PLATELET
Basophils Absolute: 0 10*3/uL (ref 0.0–0.1)
Basophils Relative: 0 % (ref 0–1)
Eosinophils Absolute: 0 10*3/uL (ref 0.0–0.7)
Eosinophils Relative: 0 % (ref 0–5)
HEMATOCRIT: 40.6 % (ref 36.0–46.0)
HEMOGLOBIN: 13.9 g/dL (ref 12.0–15.0)
LYMPHS ABS: 1.5 10*3/uL (ref 0.7–4.0)
LYMPHS PCT: 18 % (ref 12–46)
MCH: 30.3 pg (ref 26.0–34.0)
MCHC: 34.2 g/dL (ref 30.0–36.0)
MCV: 88.5 fL (ref 78.0–100.0)
MONO ABS: 0.6 10*3/uL (ref 0.1–1.0)
Monocytes Relative: 7 % (ref 3–12)
NEUTROS ABS: 6.3 10*3/uL (ref 1.7–7.7)
Neutrophils Relative %: 75 % (ref 43–77)
Platelets: 150 10*3/uL (ref 150–400)
RBC: 4.59 MIL/uL (ref 3.87–5.11)
RDW: 13.6 % (ref 11.5–15.5)
WBC: 8.5 10*3/uL (ref 4.0–10.5)

## 2014-05-31 LAB — PREGNANCY, URINE: Preg Test, Ur: NEGATIVE

## 2014-05-31 LAB — POCT RAPID STREP A (OFFICE): Rapid Strep A Screen: NEGATIVE

## 2014-05-31 MED ORDER — ACETAMINOPHEN 325 MG PO TABS
650.0000 mg | ORAL_TABLET | Freq: Four times a day (QID) | ORAL | Status: DC | PRN
  Administered 2014-06-01 – 2014-06-02 (×2): 650 mg via ORAL
  Filled 2014-05-31 (×2): qty 2

## 2014-05-31 MED ORDER — MORPHINE SULFATE 2 MG/ML IJ SOLN: 1.0000 mg | INTRAMUSCULAR | Status: DC | PRN

## 2014-05-31 MED ORDER — B COMPLEX PO TABS: 1.0000 | ORAL_TABLET | Freq: Every day | ORAL | Status: DC

## 2014-05-31 MED ORDER — SODIUM CHLORIDE 0.9 % IV SOLN
1.0000 g | Freq: Three times a day (TID) | INTRAVENOUS | Status: DC
  Administered 2014-05-31 – 2014-06-02 (×6): 1 g via INTRAVENOUS
  Filled 2014-05-31 (×8): qty 1

## 2014-05-31 MED ORDER — ACETAMINOPHEN 650 MG RE SUPP: 650.0000 mg | Freq: Four times a day (QID) | RECTAL | Status: DC | PRN

## 2014-05-31 MED ORDER — ONDANSETRON HCL 4 MG PO TABS: 4.0000 mg | ORAL_TABLET | Freq: Four times a day (QID) | ORAL | Status: DC | PRN

## 2014-05-31 MED ORDER — HYDROCODONE-ACETAMINOPHEN 5-325 MG PO TABS
1.0000 | ORAL_TABLET | ORAL | Status: DC | PRN
  Administered 2014-05-31 – 2014-06-01 (×2): 2 via ORAL
  Filled 2014-05-31 (×2): qty 2

## 2014-05-31 MED ORDER — VITAMIN D3 50 MCG (2000 UT) PO TABS: 1.0000 | ORAL_TABLET | Freq: Every day | ORAL | Status: DC

## 2014-05-31 MED ORDER — SENNA 8.6 MG PO TABS
1.0000 | ORAL_TABLET | Freq: Two times a day (BID) | ORAL | Status: DC
  Administered 2014-05-31 – 2014-06-03 (×5): 8.6 mg via ORAL
  Filled 2014-05-31 (×6): qty 1

## 2014-05-31 MED ORDER — FOLIC ACID 1 MG PO TABS
1.0000 mg | ORAL_TABLET | Freq: Every day | ORAL | Status: DC
  Administered 2014-06-01 – 2014-06-03 (×3): 1 mg via ORAL
  Filled 2014-05-31 (×3): qty 1

## 2014-05-31 MED ORDER — POLYETHYLENE GLYCOL 3350 17 G PO PACK: 17.0000 g | PACK | Freq: Every day | ORAL | Status: DC | PRN

## 2014-05-31 MED ORDER — IOHEXOL 300 MG/ML  SOLN
80.0000 mL | Freq: Once | INTRAMUSCULAR | Status: AC | PRN
  Administered 2014-05-31: 80 mL via INTRAVENOUS

## 2014-05-31 MED ORDER — VANCOMYCIN HCL IN DEXTROSE 1-5 GM/200ML-% IV SOLN
1000.0000 mg | Freq: Two times a day (BID) | INTRAVENOUS | Status: DC
  Administered 2014-05-31 – 2014-06-02 (×4): 1000 mg via INTRAVENOUS
  Filled 2014-05-31 (×6): qty 200

## 2014-05-31 MED ORDER — ZOLPIDEM TARTRATE 5 MG PO TABS: 5.0000 mg | ORAL_TABLET | Freq: Every evening | ORAL | Status: DC | PRN

## 2014-05-31 MED ORDER — DOCUSATE SODIUM 100 MG PO CAPS
100.0000 mg | ORAL_CAPSULE | Freq: Two times a day (BID) | ORAL | Status: DC
  Administered 2014-05-31 – 2014-06-03 (×5): 100 mg via ORAL
  Filled 2014-05-31 (×6): qty 1

## 2014-05-31 MED ORDER — B COMPLEX-C PO TABS
1.0000 | ORAL_TABLET | Freq: Every day | ORAL | Status: DC
  Administered 2014-06-01 – 2014-06-03 (×3): 1 via ORAL
  Filled 2014-05-31 (×4): qty 1

## 2014-05-31 MED ORDER — WHITE PETROLATUM GEL
Status: AC
  Administered 2014-05-31: 0.2
  Filled 2014-05-31: qty 1

## 2014-05-31 MED ORDER — SODIUM CHLORIDE 0.9 % IV SOLN
INTRAVENOUS | Status: DC
  Administered 2014-05-31 – 2014-06-03 (×3): via INTRAVENOUS

## 2014-05-31 MED ORDER — ONDANSETRON HCL 4 MG/2ML IJ SOLN
4.0000 mg | Freq: Four times a day (QID) | INTRAMUSCULAR | Status: DC | PRN
  Administered 2014-06-01: 4 mg via INTRAVENOUS
  Filled 2014-05-31: qty 2

## 2014-05-31 MED ORDER — VITAMIN D 1000 UNITS PO TABS
1000.0000 [IU] | ORAL_TABLET | Freq: Every day | ORAL | Status: DC
  Administered 2014-06-01 – 2014-06-03 (×3): 1000 [IU] via ORAL
  Filled 2014-05-31 (×3): qty 1

## 2014-05-31 MED ORDER — METHOTREXATE 2.5 MG PO TABS: 10.0000 mg | ORAL_TABLET | ORAL | Status: DC

## 2014-05-31 MED ORDER — HEPARIN SODIUM (PORCINE) 5000 UNIT/ML IJ SOLN
5000.0000 [IU] | Freq: Three times a day (TID) | INTRAMUSCULAR | Status: DC
  Administered 2014-05-31 – 2014-06-03 (×9): 5000 [IU] via SUBCUTANEOUS
  Filled 2014-05-31 (×8): qty 1

## 2014-05-31 NOTE — H&P (Signed)
[facility] Admission History and Physical Service Pager: [phone]  Patient name: Jenna Gordon Medical record number: [id number] Date of birth: Dec 06, 1965 Age: 49 y.o. Gender: female  Primary Care Provider: Melancon, York Ram, MD Consultants: NA Code Status: Full Code  Chief Complaint: Nasal cellulitis  Assessment and Plan: Jenna Gordon is a 49 y.o. female presenting with nasal tip and facial cellulitis. PMH is significant for RA, anemia, eczema and Sjogren's syndrome.   Facial cellulitis: Patient presents with approximately 48 hours of facial cellulitis starting at the nasal tip and spreading rapidly to anterior cervical lymph nodes and left side of face. Patient is immunocompromised with methotrexate and steroids for RA.  - Blood cultures ordered - CBC, BMP, pregnancy ordered - CT face with neck, with contrast ordered stat pending pregnancy and creatinine results. - IV vancomycin per pharmacy - Culture swabs of ulceration collected in clinic. - Monitor CBC  Rheumatoid arthritis: She has a history of rheumatoid arthritis, takes hydrocodone, infliximab, prednisone and methotrexate. Uncertain last dose of infliximab, had not been seen by pharmacy tech yet. - Continued methotrexate - Will need as patient her last steroid dose, as this is unclear in her home meds. We'll also need to check on her last dose of infliximab to see if it needed here considering its every 30 days.  Positive PPD: Isoniazid history, uncertain if she is continuing this medication it was started prophylactically on 02/20/2013. 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 - We'll need to follow-up on need of continuation, and starting while inpatient if needed.  FEN/GI: regular diet, 50 cc NACL IVF Prophylaxis: Hep sq  Disposition: pending imaging studies and clinical improvement.   History of Present Illness: Jenna Gordon  is a 49 y.o. female presenting with nasal cellulitis. Patient states that approximately 2 days ago she woke up with pain in the tip of her nose, she admits to a small ulceration in the opening of her left nares. Placing some antibiotic cream on this area at that time. She states yesterday she noticed that the left side of her neck was tender and swollen, and this morning it was swollen more as well as increasing erythema to the left side of her face. She has also noticed some right lymph node swelling and tenderness. She has not had a fever, however she is on immunosuppressants for her rheumatoid arthritis. She is eating and drinking well, without difficulty. She rates her current pain a 4/10 and mostly located in the left side of her neck. Patient was direct admitted from [facility] family medicine.  Review Of Systems: Per HPI with the following additions: per HPI Otherwise 12 point review of systems was performed and was unremarkable.  Patient Active Problem List   Diagnosis Date Noted  . Cellulitis of nose 05/31/2014  . 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 Amnesia memory loss 10/14/2012  . Sore throat 08/27/2012  . Lipid screening 04/11/2012  . Polyuria 04/11/2012  . Raynaud phenomenon 04/11/2012  . Foreign body of right ear 02/20/2012  . Strep throat 04/18/2011  . Eczema 04/18/2011  . ANEMIA, IRON DEFICIENCY 09/08/2009  . CHRONIC GINGIVITIS PLAQUE INDUCED 05/05/2008  . ANA POSITIVE 03/09/2008  . [place] SYNDROME 01/06/2008  . RHEUMATOID ARTHRITIS 04/09/2007   Past Medical History: Past Medical History  Diagnosis Date  . Rheumatoid arthritis(714.0)     Dr. Dondra Spry, [facility]  . 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 History of  glaucoma     s/p   bilateral eye laser treatment  2007  . SUI (stress urinary incontinence, female)   . Wears glasses   . Dry eyes    Past Surgical History: Past Surgical History  Procedure Laterality Date  . Hysteroscopy w/d&c  03-02-2010  . Shoulder arthroscopy with open rotator cuff repair Right 2012  . Trigger finger release  2009    left middle finger  . Carpal tunnel release Bilateral 2007  . Glaucoma surgery Bilateral 2007    laser  . Cesarean section  x2  last one 1999  . Tympanoplasty Left 1993  . Tenosynovectomy Left 05/21/2013    Procedure: LEFT INDEX FINGER TENOSYNOVECTOMY;  Surgeon: Linna Hoff, MD;  [facility];  Service: Orthopedics;  Laterality: Left;  Marland Kitchen Mass excision Left 05/21/2013    Procedure: EXCISION MASS LEFT LONG FINGER;  Surgeon: Linna Hoff, MD;  Location: [facility];  Service: Orthopedics;  Laterality: Left;   Social History: History  Substance Use Topics  . Smoking status: Never Smoker   . Smokeless tobacco: Never Used  . Alcohol Use: No   Additional social history: per HPI Please also refer to relevant sections of EMR.  Family History: No family history on file. Allergies and Medications: Allergies  Allergen Reactions  . Sulfa Antibiotics Hives   No current facility-administered medications on file prior to encounter.   Current Outpatient Prescriptions on File Prior to Encounter  Medication Sig Dispense Refill  . b complex vitamins tablet Take 1 tablet by mouth daily.    . Cholecalciferol (VITAMIN D3) 2000 UNITS TABS Take 1 capsule by mouth daily.    Marland Kitchen docusate sodium (COLACE) 100 MG capsule Take 1 capsule (100 mg total) by mouth 2 (two) times daily. 30 capsule 0  . folic acid (FOLVITE) 1 MG tablet Take 1 mg by mouth daily.      Marland Kitchen HYDROcodone-acetaminophen (NORCO/VICODIN) 5-325 MG per tablet Take 1 tablet by mouth every 6 (six) hours as needed for moderate pain.    Marland Kitchen Hydrocodone-Acetaminophen  (VICODIN) 5-300 MG TABS Take 1 tablet by mouth 4 (four) times daily as needed (PAIN). 40 each 0  . hydrOXYzine (ATARAX/VISTARIL) 25 MG tablet Take 1-2 tablets (25-50 mg total) by mouth 3 (three) times daily as needed. 30 tablet 4  . inFLIXimab in sodium chloride 0.9 % Inject into the vein every 30 (thirty) days.    . ISONIAZID PO Take 1 tablet by mouth daily. POSITIVE  TB TEST -- NO ACTIVE --  TAKING FOR PRECAUTION  DUE TO TREATMENT FOR RA    . methotrexate (RHEUMATREX) 2.5 MG tablet Take 10 mg by mouth once a week. Caution:Chemotherapy. Protect from light.---  Takes on thursday's    . Multiple Vitamin (MULTIVITAMIN) tablet Take 1 tablet by mouth daily.    . Omega-3 Fatty Acids (FISH OIL) 1000 MG CAPS Take 1 capsule by mouth daily.    . predniSONE (DELTASONE) 20 MG tablet 60 mg for 7 days, then 40 mg for 7 days, then 20 mg for 4 days, then 10 mg for 4 days 41 tablet 0  . PRESCRIPTION MEDICATION Take 1 tablet by mouth daily. Birth control pill  Objective: There were no vitals taken for this visit. Exam: Gen: NAD. Nontoxic in appearance. Pleasant female. Well developed, well nourished.  HEENT: [place]. Nasal tip cellulitis, with erythema on left nasal bridge and face. ~ 1 cm ulceration/sore left nare opening,  Bilateral TM visualized and normal in appearance. Bilateral eyes without injections or icterus. Tacky mucous membranes.  Bilateral nares with erythema and swelling.   Neck: Supple, large cervical anterior LAD, mild right cervical LAD. Preauricular LAD left. Severely TTP left anterior LAD.  CV: RRR, No murmur appreciated.  Chest: CTAB, no wheeze or crackles Abd: Soft. round. NTND. BS present. No Masses palpated.  Ext: No erythema. No edema.  Skin: left ulceration/sore left nare opening.  Neuro: Normal gait. PERLA. EOMi. Alert. Oriented. CN 2-12 intact   Labs and Imaging: CBC BMET  No results for input(s): WBC, HGB, HCT, PLT in the last 168 hours. No results for input(s): NA, K, CL, CO2,  BUN, CREATININE, GLUCOSE, CALCIUM in the last 168 hours.  No results found.   Ma Hillock, DO 05/31/2014, 10:56 AM PGY3, [place] Intern pager: [phone], text pages welcome

## 2014-05-31 NOTE — Progress Notes (Signed)
Patient ID: Jenna Gordon, female   DOB: 1965/04/17, 49 y.o.   MRN: [id number]  HPI:  Pt presents for a same day appointment to discuss nasal swelling.  Patient states she woke up yesterday morning with tender lymphadenopathy of her left anterior cervical area. Later in the day she began to notice redness and tenderness over her left nose. This gradually spread overnight to the right side of her nose. She now has tender lymphadenopathy on the right side of her neck as well. She has a history of rheumatoid arthritis and takes methotrexate, prednisone, and Remicade infusions. She has not had a fever, but reports that she's been told in the past she would not mount a fever. She's felt thirsty and her mouth has felt dry. Has a history of seasonal allergies but does not feel like her nose is particularly congested. Has had a cough at night. Applied some Triple Antibiotic ointment yesterday to inside of her nose. Denies any headaches.  ROS: See HPI  [place]: History of rheumatoid arthritis on immune suppression medications  PHYSICAL EXAM: BP 119/78 mmHg  Pulse 69  Temp(Src) 97.8 F (36.6 C) (Oral)  Wt 141 lb (63.957 kg) Gen: no acute distress, pleasant, cooperative HEENT: Normocephalic, atraumatic. Nose is erythematous and swollen. Erythema extends beyond nostrils and slightly down into the philtrum. Focal area of vesicles/papule present on medial aspect of left nare. TMs clear bilaterally with some chronic scarring of left TM. Bilateral tender cervical lymphadenopathy present Neuro: Speech normal, grossly nonfocal  ASSESSMENT/PLAN:  1. Swelling of nose: This is concerning for cellulitis given its rapid spread. Patient is afebrile, however she is immunocompromised from her rheumatoid arthritis medications. Given the location on her face and its known venous drainage to the cavernous sinus, admission for IV antibiotics is warranted. I would also like to obtain a CT scan of her face to ensure there is no  focal infection or abscess. Precepted with Dr. Nori Riis who agrees with this plan. Patient is agreeable to admission. We'll contact Dr. Raoul Pitch for admission orders.  FOLLOW UP: Admitting directly to hospital.  Jenna Gordon. Jenna Gordon, [name]

## 2014-05-31 NOTE — Progress Notes (Addendum)
Interim Progress Note:   S: She report nose bleed Friday night, and then waking up Saturday morning with redness and pain on the left nare and dorsum of nose. This progressed to redness and pain of her entire nose Sunday as well as swelling and pain under her left jaw. She denies fevers, chills, difficulty swallowing or breathing. Denies current headache or vision problems. No eye pain with movement.     O:  BP 133/88 mmHg  Pulse 67  Temp(Src) 98.2 F (36.8 C) (Oral)  Resp 16  Ht 5\' 3"  (1.6 m)  Wt 142 lb 4.8 oz (64.547 kg)  BMI 25.21 kg/m2  SpO2 100%  LMP 04/26/2014 (Approximate) General: alert, cooperative and no distress HEENT: Diffuse erythema, tenderness and warmth across entire nose. Tender left submandibular lymphadenitis. EOMI and without pain.  Heart: S1, S2 normal, no murmur, rub or gallop, regular rate and rhythm Neuro: CN 3-12 intact        A/P  Cellulitis of nose - Discussed case with ID who recommended adding Meropenem   No current concerns for fungal infections even though patient is on immune suppressive therapy for RA given CT results.   Could switched to Augmentin for 10-14 day course once clinically improving and BCx negative  Consider Shingles if not improving but unlikely since not unilateral - Call her Rheumatologist: Out of office until Thursday but left intern pager for call back  Holding methotrexate given acute infection  Trying to clarify prednisone dose     [facility] PGY-2 Resident

## 2014-05-31 NOTE — Progress Notes (Signed)
ANTIBIOTIC CONSULT NOTE - FOLLOW UP  Pharmacy Consult for Meropenem Indication: cellulitis  Allergies  Allergen Reactions  . Macrobid [Nitrofurantoin] Hives  . Sulfa Antibiotics Hives    Patient Measurements: Height: 5\' 3"  (160 cm) Weight: 142 lb 4.8 oz (64.547 kg) IBW/kg (Calculated) : 52.4  Labs:  Recent Labs  05/31/14 1157  WBC 8.5  HGB 13.9  PLT 150    Assessment:  Pharmacy dosed Vancomycin earlier today for nasal tip and facial cellulitis. Immunocompromised due to methotrexate, prednisone and Remicade infusions for RA. For CT scan of face to r/o infection/abscess.  Now adding Meropenem  Goal of Therapy:  Vancomycin trough level 10-15 mcg/ml appropriate Meropenem dose for renal function and infection  Plan:   Meropenem 1 gram IV q8hrs.  Vancomycin 1 gram IV q12hrs as ordered.  Follow up chemistries to confirm renal function.  Follow up culture data, clinical course.  Arty Baumgartner, [place] Pager: [phone] 05/31/2014,4:54 PM

## 2014-05-31 NOTE — Progress Notes (Signed)
ANTIBIOTIC CONSULT NOTE - INITIAL  Pharmacy Consult for vancomycin Indication: cellulitis  Allergies  Allergen Reactions  . Sulfa Antibiotics Hives    Patient Measurements:    Weight: 64 kg  Vital Signs: Temp: 97.8 F (36.6 C) (05/09 0836) Temp Source: Oral (05/09 0836) BP: 119/78 mmHg (05/09 0836) Pulse Rate: 69 (05/09 0836) Intake/Output from previous day:   Intake/Output from this shift:    Labs: No results for input(s): WBC, HGB, PLT, LABCREA, CREATININE in the last 72 hours. CrCl cannot be calculated (Unknown ideal weight.). No results for input(s): VANCOTROUGH, VANCOPEAK, VANCORANDOM, GENTTROUGH, GENTPEAK, GENTRANDOM, TOBRATROUGH, TOBRAPEAK, TOBRARND, AMIKACINPEAK, AMIKACINTROU, AMIKACIN in the last 72 hours.   Microbiology: No results found for this or any previous visit (from the past 720 hour(s)).  Medical History: Past Medical History  Diagnosis Date  . Rheumatoid arthritis(714.0)     Dr. Dondra Spry, [facility]  . 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 History of glaucoma     s/p   bilateral eye laser treatment  2007  . SUI (stress urinary incontinence, female)   . Wears glasses   . Dry eyes     Assessment: 49 yo F with possible cellulitis of nose.  Immunocompromised due to methotrexate, prednisone and Remicade infusions for FA.  Pt to be admitted to hospital directly from [facility].  To get CT scan of face to r/o infection/absecess.  Wt 64 kg, no labs drawn yet but creat was 0.67 in 2014. BMP ordered but then cancelled; WBC 8.5; Afebrile  vanc 5/9>> 5/9 BCx2>> 5/9 rapid step neg  Goal of Therapy:  Vancomycin trough level 10-15 mcg/ml  Plan:  -vancomycin 1 gm IV q12 for cellulitis -f/u renal fxn, wbc, temp, clinical course -vancomycin trough as  needed  Eudelia Bunch, Pharm.D. [phone] 05/31/2014 10:43 AM

## 2014-06-01 DIAGNOSIS — L03211 Cellulitis of face: Secondary | ICD-10-CM | POA: Insufficient documentation

## 2014-06-01 DIAGNOSIS — J340 Abscess, furuncle and carbuncle of nose: Principal | ICD-10-CM

## 2014-06-01 LAB — BASIC METABOLIC PANEL
ANION GAP: 8 (ref 5–15)
BUN: 10 mg/dL (ref 6–20)
CO2: 25 mmol/L (ref 22–32)
Calcium: 8.7 mg/dL — ABNORMAL LOW (ref 8.9–10.3)
Chloride: 106 mmol/L (ref 101–111)
Creatinine, Ser: 0.72 mg/dL (ref 0.44–1.00)
Glucose, Bld: 136 mg/dL — ABNORMAL HIGH (ref 70–99)
POTASSIUM: 3.7 mmol/L (ref 3.5–5.1)
SODIUM: 139 mmol/L (ref 135–145)

## 2014-06-01 LAB — CBC WITH DIFFERENTIAL/PLATELET
BASOS PCT: 0 % (ref 0–1)
Basophils Absolute: 0 10*3/uL (ref 0.0–0.1)
Eosinophils Absolute: 0.1 10*3/uL (ref 0.0–0.7)
Eosinophils Relative: 1 % (ref 0–5)
HCT: 36 % (ref 36.0–46.0)
Hemoglobin: 11.9 g/dL — ABNORMAL LOW (ref 12.0–15.0)
LYMPHS PCT: 32 % (ref 12–46)
Lymphs Abs: 1.7 10*3/uL (ref 0.7–4.0)
MCH: 29.3 pg (ref 26.0–34.0)
MCHC: 33.1 g/dL (ref 30.0–36.0)
MCV: 88.7 fL (ref 78.0–100.0)
Monocytes Absolute: 0.5 10*3/uL (ref 0.1–1.0)
Monocytes Relative: 10 % (ref 3–12)
NEUTROS PCT: 57 % (ref 43–77)
Neutro Abs: 3 10*3/uL (ref 1.7–7.7)
PLATELETS: 146 10*3/uL — AB (ref 150–400)
RBC: 4.06 MIL/uL (ref 3.87–5.11)
RDW: 13.5 % (ref 11.5–15.5)
WBC: 5.3 10*3/uL (ref 4.0–10.5)

## 2014-06-01 NOTE — Progress Notes (Signed)
  Spoke with the Pt's Rheum office. She currently takes MTX 2.5 mg weekly for RA and she last took it on 5/6 and next is due 5/13. She also take infliximab and last had it 4/29. She gets every two months and is already set up for therapy in 06/2014.  Office states that she does not take prednisone.   Alyssa A. Lincoln Brigham MD, [place] [facility] Resident PGY-1 Pager [phone]

## 2014-06-01 NOTE — Care Management Note (Signed)
Case Management Note  Patient Details  Name: Jenna Gordon MRN: [id number] Date of Birth: 1966-01-05  Subjective/Objective:                    Action/Plan:UR completed   Expected Discharge Date:  06/02/14               Expected Discharge Plan:  Home/Self Care  In-House Referral:     Discharge planning Services     Post Acute Care Choice:    Choice offered to:     DME Arranged:    DME Agency:     HH Arranged:    HH Agency:     Status of Service:  In process, will continue to follow  Medicare Important Message Given:    Date Medicare IM Given:    Medicare IM give by:    Date Additional Medicare IM Given:    Additional Medicare Important Message give by:     If discussed at [place] of Stay Meetings, dates discussed:    Additional Comments:  Marilu Favre, RN 06/01/2014, 1:11 PM

## 2014-06-01 NOTE — Progress Notes (Signed)
Family Medicine Teaching Service Daily Progress Note Intern Pager: [phone]  Patient name: Jenna Gordon Medical record number: [id number] Date of birth: 1965-01-29 Age: 49 y.o. Gender: female  Primary Care Provider: Aquilla Hacker, MD Consultants: None Code Status: Full  Pt Overview and Major Events to Date:  5/9: Admitted for facial cellulitis  Assessment and Plan:  Jenna Gordon is a 49 y.o. female presenting with nasal tip and facial cellulitis. PMH is significant for RA, anemia, eczema and Sjogren's syndrome.   Facial cellulitis: Patient presents with approximately 48 hours of facial cellulitis starting at the nasal tip and spreading rapidly to anterior cervical lymph nodes and left side of face. Patient is immunocompromised with methotrexate and steroids for RA.  - f/u Blood cultures  - IV vancomycin, meropenem per pharmacy - Culture swabs of ulceration collected in clinic. - Monitor CBC  Rheumatoid arthritis: She has a history of rheumatoid arthritis, takes hydrocodone, infliximab, prednisone and methotrexate. Uncertain last dose of infliximab, had not been seen by pharmacy tech yet. -F.u MTX, prednisone dose with home rheumatologist - last infliximab given recently, gets every 30 days   FEN/GI: regular diet, 50 cc NACL IVF Prophylaxis: Hep sq  Disposition: pending imaging studies and clinical improvement.    Subjective:  Feels nose cellulitis improved, but spreading over left nasolabial fold  Objective: Temp:  [97.7 F (36.5 C)-99.1 F (37.3 C)] 97.7 F (36.5 C) (05/10 0603) Pulse Rate:  [54-70] 54 (05/10 0603) Resp:  [16-17] 16 (05/10 0603) BP: (107-133)/(65-88) 107/65 mmHg (05/10 0603) SpO2:  [98 %-100 %] 99 % (05/10 0603) Weight:  [142 lb 4.8 oz (64.547 kg)] 142 lb 4.8 oz (64.547 kg) (05/09 1116) Physical Exam: Gen: NAD. Nontoxic in appearance. Pleasant female. Well developed, well nourished.  HEENT: [place]. Nasal tip cellulitis, with erythema on left  nasal bridge and face. ~ 1 cm ulceration/sore left nare opening Bilateral eyes without injections or icterus. Improved Bilateral nares with erythema and swelling. .  CV: RRR, No murmur appreciated.  Chest: CTAB, no wheeze or crackles Abd: Soft. round. NTND. BS present. No Masses palpated.  Ext: No erythema. No edema.  Neuro: AO no focal deficits  Laboratory:  Recent Labs Lab 05/31/14 1157 06/01/14 0715  WBC 8.5 5.3  HGB 13.9 11.9*  HCT 40.6 36.0  PLT 150 146*   No results for input(s): NA, K, CL, CO2, BUN, CREATININE, CALCIUM, PROT, BILITOT, ALKPHOS, ALT, AST, GLUCOSE in the last 168 hours.  Invalid input(s): LABALBU    Imaging/Diagnostic Tests: 5/9 CT maxillofacial  IMPRESSION: Mild paranasal cellulitis with extension of minimal subcutaneous fat stranding along the left side of the face overlying the masseter muscle to the level of the angle of the mandible.  Presumed reactive mild lymph node prominence involving the submandibular nodes and left jugular chain lymph nodes.  No drainable fluid collection to suggest abscess.   Jenna Bourbon, MD 06/01/2014, 8:49 AM PGY-1, [place] Intern pager: [phone], text pages welcome

## 2014-06-02 MED ORDER — AMOXICILLIN-POT CLAVULANATE 500-125 MG PO TABS
1.0000 | ORAL_TABLET | Freq: Three times a day (TID) | ORAL | Status: DC
Start: 2014-06-02 — End: 2014-06-03
  Administered 2014-06-02 – 2014-06-03 (×3): 500 mg via ORAL
  Filled 2014-06-02 (×5): qty 1

## 2014-06-02 NOTE — Progress Notes (Signed)
Family Medicine Teaching Service Daily Progress Note Intern Pager: [phone]  Patient name: Jenna Gordon Medical record number: [id number] Date of birth: Sep 07, 1965 Age: 49 y.o. Gender: female  Primary Care Provider: Aquilla Hacker, MD Consultants: None Code Status: Full  Pt Overview and Major Events to Date:  5/9: Admitted for facial cellulitis  Assessment and Plan:  Jenna Gordon is a 49 y.o. female presenting with nasal tip and facial cellulitis. PMH is significant for RA, anemia, eczema and Sjogren's syndrome.   Facial cellulitis: Patient presents with approximately 48 hours of facial cellulitis starting at the nasal tip and spreading rapidly to anterior cervical lymph nodes and left side of face. Patient is immunocompromised with methotrexate and steroids for RA.  - Blood cultures NGTD, will be 48 hrs at 12:00 5/11 - IV vancomycin, meropenem per pharmacy, will transition to oral augmentin when cultures neg x48 hours - Culture swabs of ulceration collected in clinic. - Monitor CBC  Rheumatoid arthritis: She has a history of rheumatoid arthritis, takes hydrocodone, infliximab, prednisone and methotrexate. Uncertain last dose of infliximab, had not been seen by pharmacy tech yet. - MTX 2.5 g weekly, last taken 5/6, due 5/13 - Does not take prednisone regularly - Last infliximab given 4/29 to be given q2 mo  FEN/GI: regular diet, KVO Prophylaxis: Hep sq  Disposition: pending imaging studies and clinical improvement.   Subjective:  Feels nose cellulitis improving, pain and swelling much better  Objective: Temp:  [97.3 F (36.3 C)-98.6 F (37 C)] 98.6 F (37 C) (05/11 0500) Pulse Rate:  [55-61] 55 (05/11 0500) Resp:  [16-19] 19 (05/11 0500) BP: (97-129)/(51-79) 97/51 mmHg (05/11 0500) SpO2:  [100 %] 100 % (05/11 0500) Physical Exam: Gen: NAD. Lying comfortably in bed HEENT: Nasal tip cellulitis improved, left nasolabial erythema area improving, but still present  over smaller surface area CV: RRR, No murmur appreciated.  Chest: CTAB, no wheeze or crackles Abd: Soft. round. NTND. BS present. No Masses palpated.  Ext: No erythema. No edema.  Neuro: AO no focal deficits  Laboratory:  Recent Labs Lab 05/31/14 1157 06/01/14 0715  WBC 8.5 5.3  HGB 13.9 11.9*  HCT 40.6 36.0  PLT 150 146*    Recent Labs Lab 06/01/14 0715  NA 139  K 3.7  CL 106  CO2 25  BUN 10  CREATININE 0.72  CALCIUM 8.7*  GLUCOSE 136*      Imaging/Diagnostic Tests: 5/9 CT maxillofacial  IMPRESSION: Mild paranasal cellulitis with extension of minimal subcutaneous fat stranding along the left side of the face overlying the masseter muscle to the level of the angle of the mandible.  Presumed reactive mild lymph node prominence involving the submandibular nodes and left jugular chain lymph nodes.  No drainable fluid collection to suggest abscess.   Veatrice Bourbon, MD 06/02/2014, 9:25 AM PGY-1, [place] Intern pager: [phone], text pages welcome

## 2014-06-03 ENCOUNTER — Encounter (HOSPITAL_COMMUNITY): Payer: Self-pay

## 2014-06-03 MED ORDER — AMOXICILLIN-POT CLAVULANATE 500-125 MG PO TABS: 1.0000 | ORAL_TABLET | Freq: Three times a day (TID) | ORAL | Status: DC

## 2014-06-03 MED ORDER — AMOXICILLIN-POT CLAVULANATE 500-125 MG PO TABS: 1.0000 | ORAL_TABLET | Freq: Three times a day (TID) | ORAL | Status: AC

## 2014-06-03 NOTE — Discharge Summary (Signed)
[facility] Discharge Summary  Patient name: Jenna Gordon Medical record number: [id number] Date of birth: 1965-12-25 Age: 49 y.o. Gender: female Date of Admission: 05/31/2014  Date of Discharge: 06/03/2014  Admitting Physician: Lupita Dawn, MD  Primary Care Provider: Aquilla Hacker, MD Consultants: None  Indication for Hospitalization:  Facial cellulitis  Discharge Diagnoses/Problem List:  Patient Active Problem List   Diagnosis Date Noted  . Facial cellulitis   . Cellulitis of nose 05/31/2014  . Cellulitis of nasal tip 05/31/2014  . 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 Amnesia memory loss 10/14/2012  . Sore throat 08/27/2012  . Lipid screening 04/11/2012  . Polyuria 04/11/2012  . Raynaud phenomenon 04/11/2012  . Foreign body of right ear 02/20/2012  . Strep throat 04/18/2011  . Eczema 04/18/2011  . ANEMIA, IRON DEFICIENCY 09/08/2009  . CHRONIC GINGIVITIS PLAQUE INDUCED 05/05/2008  . ANA POSITIVE 03/09/2008  . [place] SYNDROME 01/06/2008  . RHEUMATOID ARTHRITIS 04/09/2007     Disposition: Home  Discharge Condition: Stable  Discharge Exam:  Temp: [97.9 F (36.6 C)-98.6 F (37 C)] 98.6 F (37 C) (05/12 0455) Pulse Rate: [59-72] 59 (05/12 0455) Resp: [17-18] 17 (05/12 0455) BP: (97-107)/(62-66) 107/66 mmHg (05/12 0455) SpO2: [99 %-100 %] 99 % (05/12 0455) Physical Exam: Gen: NAD. Lying comfortably in bed HEENT: Nasal tip cellulitis improved, left nasolabial erythema area much improved CV: RRR, No murmur appreciated.  Chest: CTAB, no wheeze or crackles Abd: Soft. round. NTND. BS present. No Masses palpated.  Ext: No erythema. No edema.  Neuro: AO no focal deficits  Brief Hospital Course:  Jenna Gordon is a 49 y.o. female presenting with nasal tip and facial cellulitis. PMH is significant for RA, anemia, eczema and Sjogren's syndrome.    Cellulitis Given the location of her cellulitis she had a CT of her maxillofacial region which was significant for cellulitis but no involvement of deeper structures. She was started on vanomycin and meropenem IV and blood cultures were obtained. She remained afebrile while inpatient. She was transitioned to Augmentin after the blood cultures failed to grow any microbes for 48 hours. She was kept in house for 24 hours after being transitioned to oral therapy to ensure tolerance. She tolerated it well with no issues and continued to improve  Issues for Follow Up:  1. Blood cultures- ensure no growth for full 5 days  Significant Procedures:  CT maxillofacial  Significant Labs and Imaging:   Recent Labs Lab 05/31/14 1157 06/01/14 0715  WBC 8.5 5.3  HGB 13.9 11.9*  HCT 40.6 36.0  PLT 150 146*    Recent Labs Lab 06/01/14 0715  NA 139  K 3.7  CL 106  CO2 25  GLUCOSE 136*  BUN 10  CREATININE 0.72  CALCIUM 8.7*      Results/Tests Pending at Time of Discharge:  Blood culture  Discharge Medications:    Medication List    ASK your doctor about these medications        b complex vitamins tablet  Take 1 tablet by mouth daily.     CALCIUM 1200 PO  Take 1,200 mg by mouth 2 (two) times daily.     docusate sodium 100 MG capsule  Commonly known as:  COLACE  Take 1 capsule (100 mg total) by mouth 2 (two) times daily.     Fish Oil 1000 MG Caps  Take 1,000 mg by mouth daily.  folic acid 1 MG tablet  Commonly known as:  FOLVITE  Take 1 mg by mouth daily.     HYDROcodone-acetaminophen 5-325 MG per tablet  Commonly known as:  NORCO/VICODIN  Take 0.25 tablets by mouth at bedtime as needed for moderate pain. 1/4 tablet     Hydrocodone-Acetaminophen 5-300 MG Tabs  Commonly known as:  VICODIN  Take 1 tablet by mouth 4 (four) times daily as needed (PAIN).     hydrOXYzine 25 MG tablet  Commonly known as:  ATARAX/VISTARIL  Take 1-2 tablets (25-50 mg total) by mouth 3  (three) times daily as needed.     ibuprofen 200 MG tablet  Commonly known as:  ADVIL,MOTRIN  Take 200-600 mg by mouth every 6 (six) hours as needed (pain).     inFLIXimab in sodium chloride 0.9 %  Inject into the vein every 8 (eight) weeks. Remicade - injected at [facility] in [facility] - last injection 05/21/14     methotrexate 2.5 MG tablet  Commonly known as:  RHEUMATREX  Take 10 mg by mouth once a week. Caution:Chemotherapy. Protect from light.---  Take on Thursday or Friday     predniSONE 5 MG tablet  Commonly known as:  DELTASONE  Take 5 mg by mouth daily with breakfast.     predniSONE 20 MG tablet  Commonly known as:  DELTASONE  60 mg for 7 days, then 40 mg for 7 days, then 20 mg for 4 days, then 10 mg for 4 days     Vitamin D3 2000 UNITS Tabs  Take 2,000 Units by mouth daily.        Discharge Instructions: Please refer to Patient Instructions section of EMR for full details.  Patient was counseled important signs and symptoms that should prompt return to medical care, changes in medications, dietary instructions, activity restrictions, and follow up appointments.   Follow-Up Appointments:   Veatrice Bourbon, MD 06/03/2014, 9:16 AM PGY-1, [place]

## 2014-06-03 NOTE — Progress Notes (Signed)
Family Medicine Teaching Service Daily Progress Note Intern Pager: [phone]  Patient name: Jenna Gordon Medical record number: [id number] Date of birth: 06-06-1965 Age: 49 y.o. Gender: female  Primary Care Provider: Aquilla Hacker, MD Consultants: None Code Status: Full  Pt Overview and Major Events to Date:  5/9: Admitted for facial cellulitis  Assessment and Plan:  Jenna Gordon is a 49 y.o. female presenting with nasal tip and facial cellulitis. PMH is significant for RA, anemia, eczema and Sjogren's syndrome.   Facial cellulitis: Patient presents with approximately 48 hours of facial cellulitis starting at the nasal tip and spreading rapidly to anterior cervical lymph nodes and left side of face. Patient is immunocompromised with methotrexate and steroids for RA.  - Blood cultures NGTD,  -  On oral augmentin to continue for total of 10 days - Culture swabs of ulceration collected in clinic. - Monitor CBC  Rheumatoid arthritis: She has a history of rheumatoid arthritis, takes hydrocodone, infliximab, prednisone and methotrexate. Uncertain last dose of infliximab, had not been seen by pharmacy tech yet. - MTX 2.5 g weekly, last taken 5/6, due 5/13 - Does not take prednisone regularly - Last infliximab given 4/29 to be given q2 mo  FEN/GI: regular diet, KVO Prophylaxis: Hep sq  Disposition: pending imaging studies and clinical improvement.   Subjective:  Feels nose cellulitis much improved, slept well  Objective: Temp:  [97.9 F (36.6 C)-98.6 F (37 C)] 98.6 F (37 C) (05/12 0455) Pulse Rate:  [59-72] 59 (05/12 0455) Resp:  [17-18] 17 (05/12 0455) BP: (97-107)/(62-66) 107/66 mmHg (05/12 0455) SpO2:  [99 %-100 %] 99 % (05/12 0455) Physical Exam: Gen: NAD. Lying comfortably in bed HEENT: Nasal tip cellulitis improved, left nasolabial erythema area much improved CV: RRR, No murmur appreciated.  Chest: CTAB, no wheeze or crackles Abd: Soft. round. NTND. BS  present. No Masses palpated.  Ext: No erythema. No edema.  Neuro: AO no focal deficits  Laboratory:  Recent Labs Lab 05/31/14 1157 06/01/14 0715  WBC 8.5 5.3  HGB 13.9 11.9*  HCT 40.6 36.0  PLT 150 146*    Recent Labs Lab 06/01/14 0715  NA 139  K 3.7  CL 106  CO2 25  BUN 10  CREATININE 0.72  CALCIUM 8.7*  GLUCOSE 136*      Imaging/Diagnostic Tests: 5/9 CT maxillofacial  IMPRESSION: Mild paranasal cellulitis with extension of minimal subcutaneous fat stranding along the left side of the face overlying the masseter muscle to the level of the angle of the mandible.  Presumed reactive mild lymph node prominence involving the submandibular nodes and left jugular chain lymph nodes.  No drainable fluid collection to suggest abscess.   Veatrice Bourbon, MD 06/03/2014, 8:22 AM PGY-1, [place] Intern pager: [phone], text pages welcome

## 2014-06-06 LAB — CULTURE, BLOOD (ROUTINE X 2)
CULTURE: NO GROWTH
Culture: NO GROWTH

## 2014-06-24 ENCOUNTER — Ambulatory Visit: Payer: No Typology Code available for payment source | Admitting: Family Medicine

## 2014-06-30 ENCOUNTER — Other Ambulatory Visit (HOSPITAL_COMMUNITY)
Admission: RE | Admit: 2014-06-30 | Discharge: 2014-06-30 | Disposition: A | Discharge status: Home / Self Care | Payer: No Typology Code available for payment source | Source: Ambulatory Visit | Attending: Family Medicine | Admitting: Family Medicine

## 2014-06-30 ENCOUNTER — Telehealth: Payer: Self-pay | Admitting: Family Medicine

## 2014-06-30 ENCOUNTER — Ambulatory Visit (HOSPITAL_COMMUNITY)
Admission: RE | Admit: 2014-06-30 | Discharge: 2014-06-30 | Disposition: A | Discharge status: Home / Self Care | Payer: No Typology Code available for payment source | Source: Ambulatory Visit | Attending: Family Medicine | Admitting: Family Medicine

## 2014-06-30 ENCOUNTER — Other Ambulatory Visit: Payer: Self-pay | Admitting: Family Medicine

## 2014-06-30 ENCOUNTER — Encounter: Payer: Self-pay | Admitting: Family Medicine

## 2014-06-30 ENCOUNTER — Ambulatory Visit (INDEPENDENT_AMBULATORY_CARE_PROVIDER_SITE_OTHER): Payer: No Typology Code available for payment source | Admitting: Family Medicine

## 2014-06-30 VITALS — BP 140/79 | HR 81 | Temp 97.9°F | Ht 63.0 in | Wt 142.0 lb

## 2014-06-30 DIAGNOSIS — N898 Other specified noninflammatory disorders of vagina: Secondary | ICD-10-CM

## 2014-06-30 DIAGNOSIS — N8320 Unspecified ovarian cysts: Secondary | ICD-10-CM | POA: Insufficient documentation

## 2014-06-30 DIAGNOSIS — Z113 Encounter for screening for infections with a predominantly sexual mode of transmission: Secondary | ICD-10-CM | POA: Diagnosis present

## 2014-06-30 DIAGNOSIS — N888 Other specified noninflammatory disorders of cervix uteri: Secondary | ICD-10-CM

## 2014-06-30 DIAGNOSIS — D259 Leiomyoma of uterus, unspecified: Secondary | ICD-10-CM | POA: Insufficient documentation

## 2014-06-30 DIAGNOSIS — L03211 Cellulitis of face: Secondary | ICD-10-CM | POA: Diagnosis not present

## 2014-06-30 DIAGNOSIS — N926 Irregular menstruation, unspecified: Secondary | ICD-10-CM | POA: Insufficient documentation

## 2014-06-30 LAB — POCT WET PREP (WET MOUNT)
CLUE CELLS WET PREP WHIFF POC: NEGATIVE
WBC, Wet Prep HPF POC: >20

## 2014-06-30 MED ORDER — DOXYCYCLINE HYCLATE 100 MG PO TABS: 100.0000 mg | ORAL_TABLET | Freq: Two times a day (BID) | ORAL | Status: DC

## 2014-06-30 NOTE — Telephone Encounter (Signed)
Patient has been scheduled with Dr. Gracy Racer for 07/01/14 at 3pm. Pt is aware.

## 2014-06-30 NOTE — Assessment & Plan Note (Signed)
Resolved. No issues at this time.

## 2014-06-30 NOTE — Patient Instructions (Addendum)
Nice to see you. We are going to get an ultrasound to evaluate for an abscess. We are going to place you on doxycycline to cover for other bacteria.  If you develop pain, fever, nausea, vomiting, abdominal pain, please seek medical attention.

## 2014-06-30 NOTE — Progress Notes (Signed)
Patient ID: Jenna Gordon, female   DOB: [birth year], 49 y.o.   MRN: [id number]  Tommi Rumps, MD Phone: [phone]  Jenna Gordon is a 49 y.o. female who presents today for f/u.  Facial cellulitis: notes this has resolved. Finished antibiotics. No swelling, erythema, fevers, or rash. No eye issues.  Vaginal discharge: has been present since last Monday. Started out with small amount though has increased. Was seen at urgent care on Friday and placed on flagyl. Reports she had GC/chlamydia collected though they did not call with results. Denies fevers and abdominal pain. Notes the discharge is like pus. No fishy odor. Notes it does have an odor though. She has not been sexually active since January. Had one partner at that time and did not use condoms. Is not on birth control. Notes irregular periods over the past year. Notes she went 9 months without a period, then has had spotting monthly over the past several months.   PMH: nonsmoker.    ROS: Per HPI   Physical Exam Filed Vitals:   06/30/14 0910  BP: 140/79  Pulse: 81  Temp: 97.9 F (36.6 C)    Gen: Well NAD HEENT: PERRL,  MMM Lungs: CTABL Nl WOB Heart: RRR, no murmur appreciated  Abd: soft, NT, ND GU: normal labia, normal vaginal mucosa, cervix with minimal erythema, no discharge from the cervical os, at the 10 o'clock location about 2 cm from the os there is a pin point area that has pus draining from it, no masses or tenderness on bimanual exam, no adnexal masses, no cervical motion tenderness Exts: Non edematous BL  LE, warm and well perfused.  Skin: no rash or evidence of cellulitis on face, no induration or swelling in face   Assessment/Plan: Please see individual problem list.  Tommi Rumps, MD [place] PGY-3

## 2014-06-30 NOTE — Assessment & Plan Note (Signed)
Patient with onset of vaginal discharge over the past week. On exam this appears to be coming from the cervical tissue and no from the os. She has not had fever or abdominal pain which is reassuring at this time. No cervical motion tenderness to indicate PID. Has been on flagyl though not improving. Patient evaluated with Dr Lindell Noe and discussed with Dr Kennon Rounds. Plan to obtain US to evaluate this area further. Will check culture of fluid, GC/chlamydia, and wet prep. Will start on doxycycline. Will call patient with results once they return.

## 2014-06-30 NOTE — Telephone Encounter (Signed)
Discussed results with patient and advised she needs to be seen by GYN. Appointment was scheduled.

## 2014-06-30 NOTE — Telephone Encounter (Signed)
Attempted to call patient with results of [place]. She did not answer and I left a message advising her to call back to our office. Patient appears to have a cystic lesion that is likely the source of her discharge. I discussed this finding with Dr Nori Riis. Patient needs to be seen by her gynecologist in the next 1-2 days for this. Will place referral and ask that [place] arrange this for the patient.

## 2014-07-01 LAB — CERVICOVAGINAL ANCILLARY ONLY
Chlamydia: NEGATIVE
Neisseria Gonorrhea: NEGATIVE

## 2014-07-03 LAB — WOUND CULTURE
GRAM STAIN: NONE SEEN
Gram Stain: NONE SEEN

## 2014-07-04 ENCOUNTER — Telehealth: Payer: Self-pay | Admitting: Family Medicine

## 2014-07-04 MED ORDER — CEPHALEXIN 500 MG PO CAPS: 500.0000 mg | ORAL_CAPSULE | Freq: Four times a day (QID) | ORAL | Status: DC

## 2014-07-04 NOTE — Telephone Encounter (Signed)
Called patient to inform of wound culture results. She states she went to Dr Marcheta Grammes office and was advised she had a stenotic cervix. States she had a procedure to open the cervix up some and states she had lots of material come out with this. Notes she had seaweed placed and had this removed Friday and will need a follow-up US in 2 weeks. I advised her that bacteria was sensitive to all the drugs tested, though the medication we placed her on was not tested, so will need to change medication for treatment of this issue. Attempted to order augmentin, though this would evidently put patient at risk for methotrexate toxicity. Will avoid cipro given black box warning for tendon issues. Appears to be sensitive to first generation cephalosporins (cefazolin). Will start patient on keflex for this. Patient is to follow-up with GYN as directed.

## 2014-07-13 ENCOUNTER — Encounter: Payer: Self-pay | Admitting: Family Medicine

## 2014-07-13 ENCOUNTER — Ambulatory Visit (INDEPENDENT_AMBULATORY_CARE_PROVIDER_SITE_OTHER): Payer: No Typology Code available for payment source | Admitting: Family Medicine

## 2014-07-13 VITALS — BP 109/74 | HR 76 | Temp 98.1°F | Ht 64.0 in | Wt 138.1 lb

## 2014-07-13 DIAGNOSIS — R21 Rash and other nonspecific skin eruption: Secondary | ICD-10-CM | POA: Diagnosis not present

## 2014-07-13 MED ORDER — HYDROXYZINE HCL 10 MG PO TABS: 10.0000 mg | ORAL_TABLET | Freq: Three times a day (TID) | ORAL | Status: DC | PRN

## 2014-07-13 NOTE — Progress Notes (Signed)
Patient ID: Jenna Gordon, female   DOB: Aug 10, 1965, 49 y.o.   MRN: [id number] Subjective:   CC: Rash  HPI:   Patient presents for SDA appointment for rash that has been present for 4 days. She states that she had been outdoors pulling green beans in the garden and thinks she may have touched poison ivy. Rash began like a mosquito bite on her right antecubital fossa and lower forearm that she try to wash off with soap. Later that day she noticed that the 2 areas had gotten a little larger and were very itchy. This morning she woke up and her forehead was itchy, and when she looked at it she noticed there was a rash there. She had a similar rash over her entire left arm 2 years ago that started in the same location, and she is worried it may be the same thing. She denies fevers or chills, purulence, household contacts with rashes, or pain. She does note she was hospitalized about one month ago with facial cellulitis and was given anti-biotics that resolved symptoms.  Review of Systems - Per HPI.   PMH - sjogren's syndrome, RA, raynaud phenomenon, h/o positive PPD, poison oak dermatitis, h/o traumatic head injury, eczema, chronic gingivitis, anemia, amnesia memory loss     Objective:  Physical Exam BP 109/74 mmHg  Pulse 76  Temp(Src) 98.1 F (36.7 C) (Oral)  Ht 5\' 4"  (1.626 m)  Wt 138 lb 1 oz (62.625 kg)  BMI 23.69 kg/m2  LMP 05/05/2014 (Approximate) GEN: NAD PULM: normal effort Skin: Right antecubital fossa with 1.5cm diameter mildly erythematous raised group of dried scabs, 1 small similar area on lower forearm Forehead with scattered mildly raised macules that are dry/coarse-appearing with no induration, warmth, or vesicles/pustules.    Assessment:     Jenna Gordon is a 49 y.o. female here for rash.    Plan:     # See problem list and after visit summary for problem-specific plans.   # Health Maintenance: Not discussed  Follow-up: Follow up in 1 week if rash is not  improving.   Hilton Sinclair, MD [place]

## 2014-07-13 NOTE — Patient Instructions (Signed)
This is most likely from a small exposure to poison ivy or oak that got spread through plant oils to your forehead. Take hydroxyzine 1/2-1 tablet three times daily as needed for itching, to prevent scratching that would worsen risk for introducing infection. Try not to touch these areas if possible. Seek immediate care if you develop more spread, pus, fevers, chills, hardening of skin, or other concerns.  Best!  Hilton Sinclair, MD

## 2014-07-14 ENCOUNTER — Telehealth: Payer: Self-pay | Admitting: Family Medicine

## 2014-07-14 DIAGNOSIS — R21 Rash and other nonspecific skin eruption: Secondary | ICD-10-CM | POA: Insufficient documentation

## 2014-07-14 NOTE — Telephone Encounter (Signed)
Was given hydrothezien ?Marland Kitchen Thought she was suppose to get prednisone Please advise

## 2014-07-14 NOTE — Assessment & Plan Note (Signed)
Rash most likely from exposure to poison ivy or oak that spread through plant oils to the forehead. No signs or symptoms of cellulitis or further spread at this time, no sign of bacterial infection/crusting. No systemic symptoms and patient is otherwise well-appearing. -Avoid scratching and touching these areas. Hydroxyzine PO prescribed when necessary. -Follow-up in 1 week if symptoms are not improving. Immediate care if increased spread, purulent, fevers, chills, induration, or other concerns. -ROI filled out to send records from the last multiple visits to patient's rheumatologist. -Seen and examined by Dr. Lindell Noe who agrees.

## 2014-07-14 NOTE — Telephone Encounter (Signed)
Will forward to Dr. Dianah Field to see if this patient will be receiving a script for prednisone on top of her hydroxyzine. Jenna Gordon,CMA

## 2014-07-14 NOTE — Telephone Encounter (Signed)
Please let her know it was just hydroxyzine (to help with itching so she does not further flare up or spread rash). I did not plan to restart prednisone. She has a rheumatologist and I do not want to interfere with their plan for her prednisone, unless rash were more severe (which it is not).  Hilton Sinclair, MD

## 2014-07-14 NOTE — Telephone Encounter (Signed)
Spoke with patient and she is not taking prednisone with rheumatology until she goes back for an appt with them in July.  Voiced understanding on just sticking with hydroxyzine and will inform our office if this doesn't help or rash gets worse. Jazmin Hartsell,CMA

## 2014-07-15 ENCOUNTER — Encounter (HOSPITAL_COMMUNITY): Payer: Self-pay | Admitting: Emergency Medicine

## 2014-07-15 ENCOUNTER — Emergency Department (INDEPENDENT_AMBULATORY_CARE_PROVIDER_SITE_OTHER)
Admission: EM | Admit: 2014-07-15 | Discharge: 2014-07-15 | Disposition: A | Discharge status: Home / Self Care | Payer: No Typology Code available for payment source | Source: Home / Self Care | Attending: Family Medicine | Admitting: Family Medicine

## 2014-07-15 DIAGNOSIS — R21 Rash and other nonspecific skin eruption: Secondary | ICD-10-CM

## 2014-07-15 MED ORDER — PREDNISONE 5 MG (48) PO TBPK: 5.0000 mg | ORAL_TABLET | Freq: Every day | ORAL | Status: DC

## 2014-07-15 MED ORDER — TRIAMCINOLONE ACETONIDE 0.1 % EX CREA: 1.0000 "application " | TOPICAL_CREAM | Freq: Two times a day (BID) | CUTANEOUS | Status: DC

## 2014-07-15 NOTE — ED Provider Notes (Signed)
Jenna Gordon is a 49 y.o. female who presents to Urgent Care today for rash. Patient has appeared rash on her head and extremities. This happened after she was doing yard work. She was seen by her primary care provider 3 days ago for suspected poison ivy dermatitis and used hydralazine which has not helped much. The rash has worsened. No fevers or chills nausea vomiting or diarrhea. No trouble breathing.   Past Medical History  Diagnosis Date  . Rheumatoid arthritis(714.0)     Dr. Dondra Spry, [facility]  . 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 History of glaucoma     s/p   bilateral eye laser treatment  2007  . SUI (stress urinary incontinence, female)   . Wears glasses   . Dry eyes    Past Surgical History  Procedure Laterality Date  . Hysteroscopy w/d&c  03-02-2010  . Shoulder arthroscopy with open rotator cuff repair Right 2012  . Trigger finger release  2009    left middle finger  . Carpal tunnel release Bilateral 2007  . Glaucoma surgery Bilateral 2007    laser  . Cesarean section  x2  last one 1999  . Tympanoplasty Left 1993  . Tenosynovectomy Left 05/21/2013    Procedure: LEFT INDEX FINGER TENOSYNOVECTOMY;  Surgeon: Linna Hoff, MD;  [facility];  Service: Orthopedics;  Laterality: Left;  Marland Kitchen Mass excision Left 05/21/2013    Procedure: EXCISION MASS LEFT LONG FINGER;  Surgeon: Linna Hoff, MD;  Location[facility];  Service: Orthopedics;  Laterality: Left;   History  Substance Use Topics  . Smoking status: Never Smoker   . Smokeless tobacco: Never Used  . Alcohol Use: No   ROS as above Medications: No current facility-administered medications for this encounter.   Current Outpatient Prescriptions  Medication Sig Dispense Refill  . folic acid  (FOLVITE) 1 MG tablet Take 1 mg by mouth daily.      . methotrexate (RHEUMATREX) 2.5 MG tablet Take 10 mg by mouth once a week. Caution:Chemotherapy. Protect from light.---  Take on Thursday or Friday    . b complex vitamins tablet Take 1 tablet by mouth daily.    . Calcium Carbonate-Vit D-Min (CALCIUM 1200 PO) Take 1,200 mg by mouth 2 (two) times daily.    . cephALEXin (KEFLEX) 500 MG capsule Take 1 capsule (500 mg total) by mouth 4 (four) times daily. 40 capsule 0  . Cholecalciferol (VITAMIN D3) 2000 UNITS TABS Take 2,000 Units by mouth daily.     Marland Kitchen doxycycline (VIBRA-TABS) 100 MG tablet Take 1 tablet (100 mg total) by mouth 2 (two) times daily. 20 tablet 0  . hydrOXYzine (ATARAX/VISTARIL) 10 MG tablet Take 1 tablet (10 mg total) by mouth 3 (three) times daily as needed. 30 tablet 0  . ibuprofen (ADVIL,MOTRIN) 200 MG tablet Take 200-600 mg by mouth every 6 (six) hours as needed (pain).    . inFLIXimab in sodium chloride 0.9 % Inject into the vein every 8 (eight) weeks. Remicade - injected at [facility] in [facility] - last injection 05/21/14    . Omega-3 Fatty Acids (FISH OIL) 1000 MG CAPS Take 1,000 mg by mouth daily.     . predniSONE (STERAPRED UNI-PAK 48 TAB) 5 MG (48) TBPK tablet  Take 1 tablet (5 mg total) by mouth daily. 12 day dosepack po 48 tablet 0  . triamcinolone cream (KENALOG) 0.1 % Apply 1 application topically 2 (two) times daily. 60 g 1   Allergies  Allergen Reactions  . Macrobid [Nitrofurantoin] Hives  . Sulfa Antibiotics Hives     Exam:  BP 121/79 mmHg  Pulse 70  Temp(Src) 98.6 F (37 C) (Oral)  Resp 16  SpO2 99%  LMP 05/05/2014 (Approximate)  Gen: Well NAD HEENT: EOMI,  MMM no oral lesions Lungs: Normal work of breathing. CTABL Heart: RRR no MRG Abd: NABS, Soft. Nondistended, Nontender Exts: Brisk capillary refill, warm and well perfused.  Skin: Erythematous blanching macular rash on forehead and extremities. Nontender.  No results found for this or any  previous visit (from the past 24 hour(s)). No results found.  Assessment and Plan: 49 y.o. female with poison ivy dermatitis likely. Prednisone Dosepak. Additionally his triamcinolone cream. Continue hydralazine. Return as needed.  Discussed warning signs or symptoms. Please see discharge instructions. Patient expresses understanding.     Gregor Hams, MD 07/15/14 [phone]

## 2014-07-15 NOTE — ED Notes (Signed)
C/o persistent rash on right arm and face and spreading down back of neck Seen at [facility] and was given hydroxyzine w/no relief Believes it may be poison ivy Denies SOB, dyspnea Alert, no signs of acute distress.

## 2014-07-15 NOTE — Discharge Instructions (Signed)
Thank you for coming in today. Call or go to the emergency room if you get worse, have trouble breathing, have chest pains, or palpitations.    Contact Dermatitis Contact dermatitis is a reaction to certain substances that touch the skin. Contact dermatitis can be either irritant contact dermatitis or allergic contact dermatitis. Irritant contact dermatitis does not require previous exposure to the substance for a reaction to occur.Allergic contact dermatitis only occurs if you have been exposed to the substance before. Upon a repeat exposure, your body reacts to the substance.  CAUSES  Many substances can cause contact dermatitis. Irritant dermatitis is most commonly caused by repeated exposure to mildly irritating substances, such as:  Makeup.  Soaps.  Detergents.  Bleaches.  Acids.  Metal salts, such as nickel. Allergic contact dermatitis is most commonly caused by exposure to:  Poisonous plants.  Chemicals (deodorants, shampoos).  Jewelry.  Latex.  Neomycin in triple antibiotic cream.  Preservatives in products, including clothing. SYMPTOMS  The area of skin that is exposed may develop:  Dryness or flaking.  Redness.  Cracks.  Itching.  Pain or a burning sensation.  Blisters. With allergic contact dermatitis, there may also be swelling in areas such as the eyelids, mouth, or genitals.  DIAGNOSIS  Your caregiver can usually tell what the problem is by doing a physical exam. In cases where the cause is uncertain and an allergic contact dermatitis is suspected, a patch skin test may be performed to help determine the cause of your dermatitis. TREATMENT Treatment includes protecting the skin from further contact with the irritating substance by avoiding that substance if possible. Barrier creams, powders, and gloves may be helpful. Your caregiver may also recommend:  Steroid creams or ointments applied 2 times daily. For best results, soak the rash area in cool  water for 20 minutes. Then apply the medicine. Cover the area with a plastic wrap. You can store the steroid cream in the refrigerator for a "chilly" effect on your rash. That may decrease itching. Oral steroid medicines may be needed in more severe cases.  Antibiotics or antibacterial ointments if a skin infection is present.  Antihistamine lotion or an antihistamine taken by mouth to ease itching.  Lubricants to keep moisture in your skin.  Burow's solution to reduce redness and soreness or to dry a weeping rash. Mix one packet or tablet of solution in 2 cups cool water. Dip a clean washcloth in the mixture, wring it out a bit, and put it on the affected area. Leave the cloth in place for 30 minutes. Do this as often as possible throughout the day.  Taking several cornstarch or baking soda baths daily if the area is too large to cover with a washcloth. Harsh chemicals, such as alkalis or acids, can cause skin damage that is like a burn. You should flush your skin for 15 to 20 minutes with cold water after such an exposure. You should also seek immediate medical care after exposure. Bandages (dressings), antibiotics, and pain medicine may be needed for severely irritated skin.  HOME CARE INSTRUCTIONS  Avoid the substance that caused your reaction.  Keep the area of skin that is affected away from hot water, soap, sunlight, chemicals, acidic substances, or anything else that would irritate your skin.  Do not scratch the rash. Scratching may cause the rash to become infected.  You may take cool baths to help stop the itching.  Only take over-the-counter or prescription medicines as directed by your caregiver.  See  your caregiver for follow-up care as directed to make sure your skin is healing properly. SEEK MEDICAL CARE IF:   Your condition is not better after 3 days of treatment.  You seem to be getting worse.  You see signs of infection such as swelling, tenderness, redness, soreness,  or warmth in the affected area.  You have any problems related to your medicines. Document Released: 01/06/2000 Document Revised: 04/02/2011 Document Reviewed: 06/13/2010 [facility] Patient Information 2015 [place], [place]. This information is not intended to replace advice given to you by your health care provider. Make sure you discuss any questions you have with your health care provider.

## 2014-08-19 ENCOUNTER — Other Ambulatory Visit: Payer: Self-pay | Admitting: Family Medicine

## 2014-08-20 NOTE — Telephone Encounter (Signed)
Looking at the phone notes, she was not supposed to get prednisone unless her rheumatologist planned for this. She was supposed to have an office visit with them in July and they would restart prednisone if they felt it was necessary. I'm not sure if this was an automatic refill request or if she requested it. Could we please call her to find out if she is requesting this medication? Thanks CGM MD

## 2014-08-23 NOTE — Telephone Encounter (Signed)
Acknowledged. Anirudh Baiz, CMA.

## 2014-08-23 NOTE — Telephone Encounter (Signed)
Thanks for following up. Glad we got it figured out.   CGM MD

## 2014-08-23 NOTE — Telephone Encounter (Signed)
Spoke with pt and she stated that rheumatologist has taken care of this for her already and disregard this refill request. Ridhaan Dreibelbis, CMA.

## 2015-02-24 DIAGNOSIS — K219 Gastro-esophageal reflux disease without esophagitis: Secondary | ICD-10-CM | POA: Insufficient documentation

## 2015-02-24 DIAGNOSIS — M8580 Other specified disorders of bone density and structure, unspecified site: Secondary | ICD-10-CM | POA: Insufficient documentation

## 2015-03-24 DIAGNOSIS — F330 Major depressive disorder, recurrent, mild: Secondary | ICD-10-CM | POA: Insufficient documentation

## 2015-09-30 LAB — LAB REPORT - SCANNED

## 2016-03-14 DIAGNOSIS — E782 Mixed hyperlipidemia: Secondary | ICD-10-CM | POA: Insufficient documentation

## 2016-03-14 DIAGNOSIS — Z9109 Other allergy status, other than to drugs and biological substances: Secondary | ICD-10-CM | POA: Insufficient documentation

## 2018-06-20 DIAGNOSIS — K641 Second degree hemorrhoids: Secondary | ICD-10-CM | POA: Insufficient documentation

## 2018-06-20 DIAGNOSIS — D259 Leiomyoma of uterus, unspecified: Secondary | ICD-10-CM | POA: Insufficient documentation

## 2019-02-06 ENCOUNTER — Other Ambulatory Visit: Payer: Self-pay

## 2019-02-06 ENCOUNTER — Encounter: Payer: Self-pay | Admitting: Family Medicine

## 2019-02-06 ENCOUNTER — Ambulatory Visit: Payer: 59 | Admitting: Family Medicine

## 2019-02-06 VITALS — BP 104/60 | HR 83 | Ht 64.0 in | Wt 139.1 lb

## 2019-02-06 DIAGNOSIS — M3500 Sicca syndrome, unspecified: Secondary | ICD-10-CM | POA: Diagnosis not present

## 2019-02-06 DIAGNOSIS — Z23 Encounter for immunization: Secondary | ICD-10-CM

## 2019-02-06 DIAGNOSIS — M069 Rheumatoid arthritis, unspecified: Secondary | ICD-10-CM

## 2019-02-06 DIAGNOSIS — M0579 Rheumatoid arthritis with rheumatoid factor of multiple sites without organ or systems involvement: Secondary | ICD-10-CM

## 2019-02-06 MED ORDER — VITAMIN D3 50 MCG (2000 UT) PO TABS: 2000.0000 [IU] | ORAL_TABLET | Freq: Every day | ORAL | 2 refills | Status: DC

## 2019-02-06 MED ORDER — CETIRIZINE HCL 10 MG PO TABS: ORAL_TABLET | ORAL | 2 refills | Status: DC

## 2019-02-06 NOTE — Progress Notes (Signed)
Subjective:    Patient ID: Jenna Gordon, female    DOB: March 09, 1965, 54 y.o.   MRN: [id number]   CC: establish care PMH significant for   HPI:  Rheumatoid arthritis/ SLE/ Sjogren's Patient currently follows with rheumatologist Dr. Demetrios Loll now and has since 2006. However, she reports that the office has had a lot of turnover and she does not receive the same treatment she once did. She would like to try to go to another rheumatologist if possible as she does not believe they have a good relationship now.  -She is currently on Xeljanz 11mg  daily, methotrexate 2.5mg  6 pills once per week  - she has prednisone for flare ups but does not take it daily - she states she has not had a flare up in a few months - she would like more clarification on all her different diagnoses and prognoses, but has been stable on her current regimen for some time now   Health maintenance:  -pap smear: had abnormal PAP with LEEP procedure in 1993-94, normal since then; 12/08/13 pap neg. Note from Dr. Cliffton Asters on 07/22/2018 in Care everywhere reports: "Normal pap and negative High risk HPV virus testing" on 07/22/2018, but unable to find cytology sent. -mammogram: Done in 07/09/2018 and was neg -lipid screening: 07/01/2018 with LDL 122, HDL 69, Total chol 213, Triglycerides 149.  -DEXA: Done 07/09/2018 and shows osteopenia and patient on vit D and calcium currently, not taking steroids daily -immunizations: up to date -Hep B negative 02/05/2005 -colonoscopy: done 01/21/2018 and will need repeat in 7 years  Patient Active Problem List   Diagnosis Date Noted  . Fibroid uterus 06/20/2018  . Grade II internal hemorrhoids 06/20/2018  . Mixed hyperlipidemia 03/14/2016  . Environmental allergies 03/14/2016  . Mild episode of recurrent major depressive disorder (HCC) 03/24/2015  . Gastroesophageal reflux disease without esophagitis 02/24/2015  . Osteopenia 02/24/2015  . 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 History of traumatic  head injury 10/14/2012  . Raynaud phenomenon 04/11/2012  . ANEMIA, IRON DEFICIENCY 09/08/2009  . SJOGREN'S SYNDROME 01/06/2008  . Rheumatoid arthritis of multiple sites with negative rheumatoid factor (HCC) 04/09/2007     Family History  Problem Relation Age of Onset  . Thyroid disease Mother   . Hypertension Mother   . CVA Mother        TIA  . Stomach cancer Father   . Diabetes Father   . Hypertension Father   . Liver cancer Other     Past Medical History:  Diagnosis Date  . Amnesia memory loss 10/14/2012  . Dry eyes   . History of glaucoma    s/p   bilateral eye laser treatment  2007  . Lupus (systemic lupus erythematosus) (HCC)   . Positive TB test    pt states cxr negative was tested due to treatment for rheumatoid arthritis since test +  , was told to take medication for tb for 9 months before going back on humira injection  . Rheumatoid arthritis(714.0)    Dr. Ed Blalock, [facility]  . Sjogren's disease (HCC)   . SUI (stress urinary incontinence, female)   . Wears glasses     Social Hx: Denies tobacco use, alcohol use, or illicit drug use.  Objective:  BP 104/60   Pulse 83   Ht 5\' 4"  (1.626 m)   Wt 139 lb 2 oz (63.1 kg)   LMP 05/05/2014 (Approximate)   SpO2 98%   BMI 23.88 kg/m  Vitals and nursing note reviewed  General: NAD, pleasant Head: Atraumatic Neck: Supple Respiratory: normal effort Abdomen: soft, nontender, nondistended. Bowel sounds present Extremities: no edema or cyanosis. WWP. MSK: normal gait Skin: warm and dry, no rashes noted Neuro: alert and oriented, no focal deficits Psych: Neatly groomed and appropriately dressed. Maintains good eye contact and is cooperative and attentive. Speech is normal volume and rate. Denies SI/ HI. Normal affect.  Assessment & Plan:    Rheumatoid arthritis of multiple sites with negative rheumatoid factor (HCC) Referral placed for patient to try to establish care with new rheumatologist. Did  discuss that this may be difficult and patient voiced understanding. She is wanting to have a better relationship with her rheumatologist than she currently has. Patient stable on Xeljanz and methotrexate currently.   # Health maintenance:  -pap smear: 12/08/13 pap neg; had abnormal PAP with LEEP procedure in 1993-94, normal since then; Note from Dr. Cliffton Asters on 07/22/2018 in Care everywhere reports: "Normal pap and negative High risk HPV virus testing" on 07/22/2018, but unable to find cytology sent. May need to request records.  -mammogram: Done in 07/09/2018 and was neg -lipid screening: 07/01/2018 with LDL 122, HDL 69, Total chol 213, Triglycerides 149.  -DEXA: Done 07/09/2018 and shows osteopenia and patient on vit D and calcium currently, not taking steroids daily -immunizations: up to date -Hep B negative 02/05/2005 -colonoscopy: done 01/21/2018 and will need repeat in 7 years -handout given on health maintenance topics  [place] Adekunle Rohrbach, DO Family Medicine Resident, PGY-3

## 2019-02-06 NOTE — Patient Instructions (Signed)
Thank you for coming to see me today. It was a pleasure!   Please reach out to me in 2 weeks about your rheumatologist if you have not heard from them.  Please follow-up as needed.  If you have any questions or concerns, please do not hesitate to call the office at [phone].  Take Care,   [place] Naphtali Zywicki, DO  Preventive Care 8-55 Years Old, Female Preventive care refers to visits with your health care provider and lifestyle choices that can promote health and wellness. This includes:  A yearly physical exam. This may also be called an annual well check.  Regular dental visits and eye exams.  Immunizations.  Screening for certain conditions.  Healthy lifestyle choices, such as eating a healthy diet, getting regular exercise, not using drugs or products that contain nicotine and tobacco, and limiting alcohol use. What can I expect for my preventive care visit? Physical exam Your health care provider will check your:  Height and weight. This may be used to calculate body mass index (BMI), which tells if you are at a healthy weight.  Heart rate and blood pressure.  Skin for abnormal spots. Counseling Your health care provider may ask you questions about your:  Alcohol, tobacco, and drug use.  Emotional well-being.  Home and relationship well-being.  Sexual activity.  Eating habits.  Work and work Statistician.  Method of birth control.  Menstrual cycle.  Pregnancy history. What immunizations do I need?  Influenza (flu) vaccine  This is recommended every year. Tetanus, diphtheria, and pertussis (Tdap) vaccine  You may need a Td booster every 10 years. Varicella (chickenpox) vaccine  You may need this if you have not been vaccinated. Zoster (shingles) vaccine  You may need this after age 18. Measles, mumps, and rubella (MMR) vaccine  You may need at least one dose of MMR if you were born in 1957 or later. You may also need a second  dose. Pneumococcal conjugate (PCV13) vaccine  You may need this if you have certain conditions and were not previously vaccinated. Pneumococcal polysaccharide (PPSV23) vaccine  You may need one or two doses if you smoke cigarettes or if you have certain conditions. Meningococcal conjugate (MenACWY) vaccine  You may need this if you have certain conditions. Hepatitis A vaccine  You may need this if you have certain conditions or if you travel or work in places where you may be exposed to hepatitis A. Hepatitis B vaccine  You may need this if you have certain conditions or if you travel or work in places where you may be exposed to hepatitis B. Haemophilus influenzae type b (Hib) vaccine  You may need this if you have certain conditions. Human papillomavirus (HPV) vaccine  If recommended by your health care provider, you may need three doses over 6 months. You may receive vaccines as individual doses or as more than one vaccine together in one shot (combination vaccines). Talk with your health care provider about the risks and benefits of combination vaccines. What tests do I need? Blood tests  Lipid and cholesterol levels. These may be checked every 5 years, or more frequently if you are over 48 years old.  Hepatitis C test.  Hepatitis B test. Screening  Lung cancer screening. You may have this screening every year starting at age 55 if you have a 30-pack-year history of smoking and currently smoke or have quit within the past 15 years.  Colorectal cancer screening. All adults should have this screening starting at  age 55 and continuing until age 67. Your health care provider may recommend screening at age 46 if you are at increased risk. You will have tests every 1-10 years, depending on your results and the type of screening test.  Diabetes screening. This is done by checking your blood sugar (glucose) after you have not eaten for a while (fasting). You may have this done every  1-3 years.  Mammogram. This may be done every 1-2 years. Talk with your health care provider about when you should start having regular mammograms. This may depend on whether you have a family history of breast cancer.  BRCA-related cancer screening. This may be done if you have a family history of breast, ovarian, tubal, or peritoneal cancers.  Pelvic exam and Pap test. This may be done every 3 years starting at age 10. Starting at age 9, this may be done every 5 years if you have a Pap test in combination with an HPV test. Other tests  Sexually transmitted disease (STD) testing.  Bone density scan. This is done to screen for osteoporosis. You may have this scan if you are at high risk for osteoporosis. Follow these instructions at home: Eating and drinking  Eat a diet that includes fresh fruits and vegetables, whole grains, lean protein, and low-fat dairy.  Take vitamin and mineral supplements as recommended by your health care provider.  Do not drink alcohol if: ? Your health care provider tells you not to drink. ? You are pregnant, may be pregnant, or are planning to become pregnant.  If you drink alcohol: ? Limit how much you have to 0-1 drink a day. ? Be aware of how much alcohol is in your drink. In the [place] one drink equals one 12 oz bottle of beer (355 mL), one 5 oz glass of wine (148 mL), or one 1 oz glass of hard liquor (44 mL). Lifestyle  Take daily care of your teeth and gums.  Stay active. Exercise for at least 30 minutes on 5 or more days each week.  Do not use any products that contain nicotine or tobacco, such as cigarettes, e-cigarettes, and chewing tobacco. If you need help quitting, ask your health care provider.  If you are sexually active, practice safe sex. Use a condom or other form of birth control (contraception) in order to prevent pregnancy and STIs (sexually transmitted infections).  If told by your health care provider, take low-dose aspirin daily  starting at age 65. What's next?  Visit your health care provider once a year for a well check visit.  Ask your health care provider how often you should have your eyes and teeth checked.  Stay up to date on all vaccines. This information is not intended to replace advice given to you by your health care provider. Make sure you discuss any questions you have with your health care provider. Document Revised: 09/19/2017 Document Reviewed: 09/19/2017 Elsevier Patient Education  2020 [employer].

## 2019-02-10 ENCOUNTER — Encounter: Payer: Self-pay | Admitting: Family Medicine

## 2019-02-10 NOTE — Assessment & Plan Note (Signed)
Referral placed for patient to try to establish care with new rheumatologist. Did discuss that this may be difficult and patient voiced understanding. She is wanting to have a better relationship with her rheumatologist than she currently has. Patient stable on Xeljanz and methotrexate currently.

## 2019-02-12 ENCOUNTER — Telehealth: Payer: Self-pay | Admitting: *Deleted

## 2019-02-12 NOTE — Telephone Encounter (Signed)
Pt wants to let PCP know that she was exposed to covid on Monday and tested positive yesterday. Christen Bame, CMA

## 2019-02-13 ENCOUNTER — Encounter: Payer: Self-pay | Admitting: Family Medicine

## 2019-02-18 ENCOUNTER — Ambulatory Visit: Payer: 59

## 2019-02-23 NOTE — Telephone Encounter (Signed)
Spoke with pt. Made virtual for 02/25/19 with Dr. Hassan Buckler. To talk about COVID Sx. Salvatore Marvel, CMA

## 2019-02-24 ENCOUNTER — Telehealth: Payer: 59

## 2019-02-25 ENCOUNTER — Telehealth (INDEPENDENT_AMBULATORY_CARE_PROVIDER_SITE_OTHER): Payer: 59 | Admitting: Family Medicine

## 2019-02-25 ENCOUNTER — Other Ambulatory Visit: Payer: Self-pay

## 2019-02-25 DIAGNOSIS — U071 COVID-19: Secondary | ICD-10-CM | POA: Diagnosis not present

## 2019-02-25 NOTE — Progress Notes (Signed)
[facility] Telemedicine Visit  Patient consented to have virtual visit. Method of visit: Video  Encounter participants: Patient: Jenna Gordon - located at home Provider: [place] Alyxis Grippi - located at [facility]  Others (if applicable): n/a  Chief Complaint: COVID-19  HPI:  COVID-19 infection Patient tested positive for COVID-19 on 02/11/2019.  States that she had sweats and chills but no recorded fever.  She reports that she is still having some congestion and sweats.  She has been taking TheraFlu cold and virus with some benefit.  She is also been taking Tussionex 10 mg nightly.  Patient reports that today she has a headache and she has been coughing on and off but she is not having any trouble breathing.  When she blows her nose she has a little bit of phlegm coming up.  She denies any productive cough.  She reports that she has had a burning in her upper chest.  She took prednisone 5 mg for 3 days on January 26-28.  Given her illness she was worried about her immune system being lowered to so she stopped taking her methotrexate and Xeljanz.  Patient reports that she felt much better after taking the prednisone but has not restarted the other medications yet.  She also has been taking Afrin for the past couple of weeks.  She is wondering if she needs to restart taking those medications. She states that she has an appointment with her new rheumatologist coming up in March and still has an appointment with her old rheumatologist on 2/22.  She is unsure if she needs to keep that appointment.  Patient reports that she is also is concerned because her mom has cystic fibrosis and with all of the congestion and cough that she is having she thinks that after she is done with this illness she may want to be checked to ensure that this is not part of the Sjogren's, lupus or rheumatoid arthritis that is causing her to have the symptoms.     ROS: per HPI  Pertinent PMHx: Rheumatoid  arthritis, lupus, Sjogren's  Exam:  General: Well-appearing, NAD Respiratory: Normal work of breathing, able to speak in complete sentences without issue  Assessment/Plan:  COVID-19 Patient reports that she is feeling better. Patient encouraged to reach out to her current rheumatologist regarding restarting her Morrie Sheldon and methotrexate, or if she does not feel comfortable reaching out to her previous rheumatologist and she may restart her current dose.  Patient is quarantining appropriately and using symptomatic treatment.  She may try Mucinex for her congestion.  Patient also encouraged to discontinue the Afrin given that this may be contributing to her worsening congestion symptoms since she has been on it for more than 3 days.  Encouraged to use a humidifier at night to help with the congestion.  Patient currently using honey for cough.  She is drinking plenty of fluids.  Strict return precautions discussed.  Patient currently not having any chest pain or shortness of breath.   Time spent during visit with patient: 24 minutes  [place] Brie Eppard, DO PGY-3, [place]

## 2019-02-26 DIAGNOSIS — U071 COVID-19: Secondary | ICD-10-CM | POA: Insufficient documentation

## 2019-02-26 NOTE — Assessment & Plan Note (Signed)
Patient reports that she is feeling better. Patient encouraged to reach out to her current rheumatologist regarding restarting her Morrie Sheldon and methotrexate, or if she does not feel comfortable reaching out to her previous rheumatologist and she may restart her current dose.  Patient is quarantining appropriately and using symptomatic treatment.  She may try Mucinex for her congestion.  Patient also encouraged to discontinue the Afrin given that this may be contributing to her worsening congestion symptoms since she has been on it for more than 3 days.  Encouraged to use a humidifier at night to help with the congestion.  Patient currently using honey for cough.  She is drinking plenty of fluids.  Strict return precautions discussed.  Patient currently not having any chest pain or shortness of breath.

## 2019-02-27 ENCOUNTER — Ambulatory Visit: Payer: 59

## 2019-03-03 ENCOUNTER — Other Ambulatory Visit: Payer: Self-pay

## 2019-03-03 ENCOUNTER — Ambulatory Visit (INDEPENDENT_AMBULATORY_CARE_PROVIDER_SITE_OTHER): Payer: 59 | Admitting: Family Medicine

## 2019-03-03 VITALS — BP 122/68 | HR 85 | Wt 139.4 lb

## 2019-03-03 DIAGNOSIS — H18893 Other specified disorders of cornea, bilateral: Secondary | ICD-10-CM | POA: Diagnosis not present

## 2019-03-03 MED ORDER — KETOROLAC TROMETHAMINE 0.4 % OP SOLN: 1.0000 [drp] | Freq: Four times a day (QID) | OPHTHALMIC | 0 refills | Status: DC

## 2019-03-03 MED ORDER — HYPROMELLOSE (GONIOSCOPIC) 2.5 % OP SOLN: 1.0000 [drp] | Freq: Three times a day (TID) | OPHTHALMIC | 0 refills | Status: DC

## 2019-03-03 NOTE — Progress Notes (Signed)
   CHIEF COMPLAINT / HPI:  Right eye redness and irritation Was diagnosed with Covid on 1/20 On 1/26, she had difficulty breathing and felt very stuffy and began to blow her nose frequently She stopped taking her Morrie Sheldon and methotrexate for RA, lupus, and Sjogren's because she was worried that she would not be able to fight off Covid as well on 1/26 She began to have a burning sensation around her right eye around that time She noticed swelling and inflammation as well as redness in both eyes but especially her right eye over the last few days She does feel that her eyes are a little "goopy" but denies a gritty feeling or foreign object sensation Denies any changes in her vision, headache has improved Restarted her immunosuppressive medications 2 days ago once her Covid symptoms improved  PERTINENT  PMH / PSH: rheumatoid arthritis, Sjogren's syndrome, lupus, COVID-19   OBJECTIVE: BP 122/68   Pulse 85   Wt 139 lb 6.4 oz (63.2 kg)   LMP 05/05/2014 (Approximate)   SpO2 98%   BMI 23.93 kg/m   General: Well-appearing, pleasant HEENT: NCAT, EOMI, PERRL, scleral injection noted bilaterally but slightly worse on the right, no lid edema noted, TM clear on right, 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  Since vision is 20/40 in right eye and 20/20 in left eye, would like patient to see her eye doctor this week for further evaluation.  Prescribed artificial tears and ketorolac eyedrops for symptomatic relief.  If this is related to her Sjogren's, hopefully it will resolve now that she has restarted her  immunosuppressive medications.  Would like patient to return in 1 week if she experiences worsening of her symptoms or no improvement.     Kathrene Alu, MD [place]

## 2019-03-03 NOTE — Patient Instructions (Addendum)
It was nice seeing you today Jenna Gordon!  I am sending artificial tears and an anti-inflammatory drop for you to put in your right eye and both eyes if needed 3-4 times per day.  This should help with lubrication and reduction of inflammation.  Will use let us know if you have not had any improvement or if you have worsening of symptoms or vision during the next week.  Your vision is slightly impaired on the right side, so I would recommend that you see your eye doctor in the next few days as well.  I hope that your eyes feel better soon.  If you have any questions or concerns, please feel free to call the clinic.   Be well,  Dr. Shan Levans

## 2019-03-04 DIAGNOSIS — H18893 Other specified disorders of cornea, bilateral: Secondary | ICD-10-CM | POA: Insufficient documentation

## 2019-03-04 NOTE — Assessment & Plan Note (Signed)
Unsure the exact cause of patient's symptoms, but could be any or all of viral conjunctivitis, Sjogren's flare after stopping immunosuppressive medications, scleral injection due to frequent cough.  Since vision is 20/40 in right eye and 20/20 in left eye, would like patient to see her eye doctor this week for further evaluation.  Prescribed artificial tears and ketorolac eyedrops for symptomatic relief.  If this is related to her Sjogren's, hopefully it will resolve now that she has restarted her immunosuppressive medications.  Would like patient to return in 1 week if she experiences worsening of her symptoms or no improvement.

## 2019-03-07 ENCOUNTER — Ambulatory Visit: Payer: 59

## 2019-03-30 NOTE — Progress Notes (Signed)
Office Visit Note  Patient: Jenna Gordon             Date of Birth: Mar 30, 1965           MRN: [id number]             PCP: Shirley, [place], DO Referring: Shirley, [place], DO Visit Date: 04/06/2019 Occupation: @GUAROCC @  Subjective:   Pain in joints   History of Present Illness: Jenna Gordon is a 54 y.o. female history of rheumatoid arthritis.  She has been seen in consultation per request of her PCP.  According to patient her symptoms started in September 2006 with bilateral arm pain and bilateral knee joint pain.  She saw Dr. Jonita Albee in December 2006 and was diagnosed with rheumatology arthritis.  She was initially started on prednisone and Enbrel and then methotrexate was added.  Patient took the combination therapy for about 2 years and then the Enbrel was discontinued due to injection site reaction.  She was switched to Humira which she took about 6 for 7 to 8 years and then it was discontinued it stopped working for her.  She was then switched to IV Remicade which she took for approximately over an year and it was discontinued due to generalized pain.  She has been on [place] and methotrexate combination for the last 3 years which has been working well for her.  She states she tried to taper off methotrexate but her symptoms flared.  She had to take 4 prednisone tapers in the last year due to flares.  She has been having discomfort in the left wrist joint currently and also some discomfort in her feet and her knees.  Overall she is pleased with the combination therapy.  She developed COVID-19 infection on January 20.  She did not to stop her medications.  Her PCP advised her to see the rheumatologist.  No recommendations were given.  Patient has stopped Xeljanz 6 days later has her pulmonary symptoms were getting worse.  She resumed Morrie Sheldon on February 18.  She is doing fine currently as regards to COVID-19 infection.  Activities of Daily Living:  Patient reports morning stiffness for 1  hour.   Patient Reports nocturnal pain.  Difficulty dressing/grooming: Denies Difficulty climbing stairs: Reports Difficulty getting out of chair: Reports Difficulty using hands for taps, buttons, cutlery, and/or writing: Reports  Review of Systems  Constitutional: Negative for fatigue, night sweats, weight gain and weight loss.  HENT: Positive for mouth sores, mouth dryness and nose dryness. Negative for trouble swallowing and trouble swallowing.   Eyes: Positive for dryness. Negative for pain, redness and visual disturbance.  Respiratory: Negative for cough, shortness of breath, wheezing and difficulty breathing.   Cardiovascular: Negative for chest pain, palpitations, hypertension, irregular heartbeat and swelling in legs/feet.  Gastrointestinal: Negative for blood in stool, constipation and diarrhea.  Endocrine: Negative for increased urination.  Genitourinary: Negative for difficulty urinating, painful urination and vaginal dryness.  Musculoskeletal: Positive for arthralgias, joint pain, joint swelling and morning stiffness. Negative for myalgias, muscle weakness, muscle tenderness and myalgias.  Skin: Positive for hair loss. Negative for color change, rash, skin tightness, ulcers and sensitivity to sunlight.  Allergic/Immunologic: Positive for susceptible to infections.  Neurological: Positive for memory loss. Negative for dizziness, numbness, headaches, night sweats and weakness.  Hematological: Negative for swollen glands.  Psychiatric/Behavioral: Negative for depressed mood, confusion and sleep disturbance. The patient is not nervous/anxious.     PMFS History:  Patient Active Problem List   Diagnosis Date  Noted  . Corneal irritation of both eyes 03/04/2019  . COVID-19 02/26/2019  . Fibroid uterus 06/20/2018  . Grade II internal hemorrhoids 06/20/2018  . Mixed hyperlipidemia 03/14/2016  . Environmental allergies 03/14/2016  . Mild episode of recurrent major depressive  disorder ([place]) 03/24/2015  . Gastroesophageal reflux disease without esophagitis 02/24/2015  . Osteopenia 02/24/2015  . 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 Raynaud phenomenon 04/11/2012  . ANEMIA, IRON DEFICIENCY 09/08/2009  . [place] SYNDROME 01/06/2008  . Rheumatoid arthritis of multiple sites with negative rheumatoid factor ([place]) 04/09/2007    Past Medical History:  Diagnosis Date  . Amnesia memory loss 10/14/2012  . Dry eyes   . History of glaucoma    s/p   bilateral eye laser treatment  2007  . Lupus (systemic lupus erythematosus) ([place])   . Positive TB test    pt states cxr negative was tested due to treatment for rheumatoid arthritis since test +  , was told to take medication for tb for 9 months before going back on humira injection  . Rheumatoid arthritis(714.0)    Dr. Dondra Spry, [facility]  . Sjogren's disease ([place])   . SUI (stress urinary incontinence, female)   . Wears glasses     Family History  Problem Relation Age of Onset  . Hypertension Mother   . CVA Mother        TIA  . Cystic fibrosis Mother   . Stomach cancer Father   . Diabetes Father   . Hypertension Father   . Hypertension Brother   . Hypertension Brother   . Healthy Daughter   . Healthy Daughter    Past Surgical History:  Procedure Laterality Date  . CARPAL TUNNEL RELEASE Bilateral 2007  . CESAREAN SECTION  x2  last one 1999  . GLAUCOMA SURGERY Bilateral 2007   laser  . HYSTEROSCOPY WITH D & C  03-02-2010  . MASS EXCISION Left 05/21/2013   Procedure: EXCISION MASS LEFT LONG FINGER;  Surgeon: Linna Hoff, MD;  Location: [facility];  Service: Orthopedics;  Laterality: Left;  . SHOULDER ARTHROSCOPY WITH OPEN ROTATOR CUFF REPAIR Right 2012  . TENOSYNOVECTOMY Left 05/21/2013   Procedure: LEFT INDEX FINGER TENOSYNOVECTOMY;  Surgeon: Linna Hoff, MD;  Location: [facility];  Service: Orthopedics;  Laterality:  Left;  . TRIGGER FINGER RELEASE  2009   left middle finger  . TYMPANOPLASTY Left 1993   Social History   Social History Narrative   Lives with Rise Mu 31yo, Daughter, April 55yo, Pets Scout and [place].    Immunization History  Administered Date(s) Administered  . Influenza Split 10/02/2011  . Influenza Whole 11/05/2008, 10/24/2009  . Influenza, High Dose Seasonal PF 09/23/2014  . Influenza,inj,Quad PF,6+ Mos 10/14/2012, 10/15/2016, 09/24/2017, 10/23/2018  . Influenza-Unspecified 10/02/2011, 10/14/2012, 11/23/2015, 10/15/2016, 09/24/2017  . PPD Test 02/11/2013  . Pneumococcal Conjugate-13 07/27/2016  . Td 08/06/2002  . Tdap 02/06/2019     Objective: Vital Signs: BP 127/83 (BP Location: Right Arm, Patient Position: Sitting, Cuff Size: Normal)   Pulse 69   Resp 13   Ht 5\' 3"  (1.6 m)   Wt 143 lb (64.9 kg)   LMP 05/05/2014 (Approximate)   BMI 25.33 kg/m    Physical Exam Vitals and nursing note reviewed.  Constitutional:      Appearance: She is well-developed.  HENT:     Head: Normocephalic and atraumatic.  Eyes:     Conjunctiva/sclera: Conjunctivae normal.  Cardiovascular:     Rate and Rhythm: Normal rate and regular rhythm.     Heart sounds: Normal heart sounds.  Pulmonary:     Effort: Pulmonary effort is normal.     Breath sounds: Normal breath sounds.  Abdominal:     General: Bowel sounds are normal.     Palpations: Abdomen is soft.  Musculoskeletal:     Cervical back: Normal range of motion.  Lymphadenopathy:     Cervical: No cervical adenopathy.  Skin:    General: Skin is warm and dry.     Capillary Refill: Capillary refill takes less than 2 seconds.  Neurological:     Mental Status: She is alert and oriented to person, place, and time.  Psychiatric:        Behavior: Behavior normal.      Musculoskeletal Exam: C-spine was in good range of motion.  Shoulder joints and elbow joints with good range of motion.  She has limited range of motion of bilateral  wrist joints and incomplete fist formation.  She has tenderness on palpation of her left wrist joint.  No synovitis was noted.  She has synovial thickening over bilateral first second and third MCP joints.  She has tenderness over several of her MCPs and PIPs as described below.  She had painful range of motion of bilateral knee joints.  She has tenderness on palpation of her left ankle joint.  She also had tenderness over some of her MTP joints.  CDAI Exam: CDAI Score: 11  Patient Global: 5 mm; Provider Global: 5 mm Swollen: 0 ; Tender: 14  Joint Exam 04/06/2019      Right  Left  Wrist      Tender  MCP 1   Tender     MCP 2   Tender   Tender  MCP 3   Tender   Tender  MCP 4   Tender     PIP 3   Tender     Knee   Tender   Tender  Ankle      Tender  MTP 2   Tender     MTP 4      Tender  MTP 5      Tender     Investigation: No additional findings.  Imaging: XR Foot 2 Views Left  Result Date: 04/06/2019 First MTP narrowing was noted.  PIP and DIP narrowing was noted.  Juxta-articular osteopenia was noted.  No erosive changes were noted.  No intertarsal or tibiotalar joint space narrowing was noted. 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  XR Foot 2 Views Right  Result Date: 04/06/2019 First MTP narrowing was noted.  PIP and DIP narrowing was noted.  Juxta-articular osteopenia was noted.  No erosive changes were noted.  No intertarsal or tibiotalar joint space narrowing was noted. 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  XR Hand 2 View Left  Result Date: 04/06/2019 Juxta-articular osteopenia was noted.  Narrowing of first second and third MCPs were noted.  Narrowing of all PIP and DIPs was noted.  Metacarpocarpal intercarpal joint space narrowing was noted.  No erosive changes were noted. Impression: These findings are consistent with rheumatoid arthritis of the hand.  XR  Hand 2 View Right  Result Date: 04/06/2019 Juxta-articular osteopenia was noted.  Subluxation of first MCP joint was noted.  Narrowing of first, second, third and fourth MCPs was noted.  Narrowing of PIP joints was noted.  Mild  intercarpal radiocarpal joint space narrowing was noted.  No erosive changes were noted. Impression: These findings are consistent with rheumatoid arthritis of the hand.  XR KNEE 3 VIEW LEFT  Result Date: 04/06/2019 No significant medial lateral compartment narrowing was noted.  Moderate patellofemoral narrowing was noted. 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  XR KNEE 3 VIEW RIGHT  Result Date: 04/06/2019 No significant medial lateral compartment narrowing was noted.  Moderate patellofemoral narrowing was noted. 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   Recent Labs: Lab Results  Component Value Date   WBC 5.3 06/01/2014   HGB 11.9 (L) 06/01/2014   PLT 146 (L) 06/01/2014   NA 139 06/01/2014   K 3.7 06/01/2014   CL 106 06/01/2014   CO2 25 06/01/2014   GLUCOSE 136 (H) 06/01/2014   BUN 10 06/01/2014   CREATININE 0.72 06/01/2014   BILITOT 0.4 04/11/2012   ALKPHOS 73 04/11/2012   AST 13 04/11/2012   ALT 9 04/11/2012   PROT 7.2 04/11/2012   ALBUMIN 4.1 04/11/2012   CALCIUM 8.7 (L) 06/01/2014   GFRAA >60 06/01/2014    Speciality Comments: No specialty comments available.  Procedures:  No procedures performed Allergies: Cefzil [cefprozil], Macrobid [nitrofurantoin], and Sulfa antibiotics   Assessment / Plan:     Visit Diagnoses: Rheumatoid arthritis of multiple sites with negative rheumatoid factor ([place]) -patient was diagnosed with rheumatoid arthritis in 2006.  She states she has severe rheumatoid arthritis which has been poorly controlled for many years.  She had an adequate response to Enbrel, Humira, intolerance to Remicade.  She has been  on [place] and methotrexate combination for the last 3 years.  Plan: Sedimentation rate, Rheumatoid factor, Cyclic citrul peptide antibody, IgG, 14-3-3 eta Protein, ANA  Detailed counseling guarding methotrexate and Morrie Sheldon was provided.  Informed consent was obtained.  Drug Counseling TB Gold: Pending Hepatitis panel: Pending  Chest-xray: Pending  Contraception: Postmenopausal  Alcohol use: None  Patient was counseled on the purpose, proper use, and adverse effects of methotrexate including nausea, infection, and signs and symptoms of pneumonitis.  Reviewed instructions with patient to take methotrexate weekly along with folic acid daily.  Discussed the importance of frequent monitoring of kidney and liver function and blood counts, and provided patient with standing lab instructions.  Counseled patient to avoid NSAIDs and alcohol while on methotrexate.  Provided patient with educational materials on methotrexate and answered all questions.  Advised patient to get annual influenza vaccine and to get a pneumococcal vaccine if patient has not already had one.  Patient voiced understanding.  Patient consented to methotrexate use.  Will upload into chart.   Medication counseling: Baseline Immunosuppressant Therapy Labs TB GOLD   Hepatitis Panel Hepatitis 02/05/2005  Hep B Surface Ag neg  Hep C Ab neg   HIV No results found for: HIV Immunoglobulins   SPEP Serum Protein Electrophoresis Latest Ref Rng & Units 04/11/2012  Total Protein 6.0 - 8.3 g/dL 7.2   G6PD No results found for: G6PDH TPMT No results found for: TPMT  LIPIDS Lab Results  Component Value Date   CHOL 148 04/11/2012   HDL 58 04/11/2012   LDLCALC 59 04/11/2012   TRIG 154 (H) 04/11/2012   CHOLHDL 2.6 04/11/2012     Chest x-ray: Pending  Does patient have history of diverticulitis?  No  Counseled patient that Morrie Sheldon is a JAK inhibitor.  Counseled patient on purpose, proper use, and  adverse effects of Xeljanz.   Reviewed the most common adverse effects including infection, diarrhea, headaches. Counseled on the increased risk of DVT/PE.  Also reviewed rare adverse effects such as bowel injury and the need to contact us if develops stomach pain during treatment.  Reviewed with patient that there is the possibility of an increased risk of malignancy but it is not well understood if this increased risk is due to the medication or the disease state.  Counseled that Morrie Sheldon should be held for infection and prior to surgery.  Counseled patient to avoid live vaccines while on [place].  Recommend annual influenza, Pneumovax 23, Prevnar 13, and Shingrix as indicated.   Reviewed importance of routine lab monitoring including a lipid panel.  Standing orders placed and patient is to return in 1 month and then every 3 months after initiation. Provided patient with medication education material and answered all questions.  Patient consented to [place].  Will upload Morrie Sheldon into patient's chart.  Will apply through patient's insurance and update when we receive a response.    High risk medication use - MTX 6 tablets once weekly, folic acid 1 mg po daily, Xeljanz 11 mg po daily, prednisone 5 mg po daily as needed for flares.  She states she has taken prednisone about 4 times in the last year.  I do not see any synovitis although she did have tenderness on palpation.  Will obtain following labs today.- Plan: CBC with Differential/Platelet, COMPLETE METABOLIC PANEL WITH GFR, Hepatitis B core antibody, IgM, Hepatitis B surface antigen, Hepatitis C antibody, HIV Antibody (routine testing w rflx), QuantiFERON-TB Gold Plus, Serum protein electrophoresis with reflex, IgG, IgA, IgM, DG Chest 2 View  Pain in both hands -she has pain and discomfort in bilateral hands.  She also has limited range of motion of her wrist joints and incomplete fist formation.  She has synovial thickening in bilateral hands.  Plan: XR Hand 2 View Right, XR Hand 2  View Left.  The x-ray showed changes consistent with rheumatoid arthritis which extra-articular osteopenia MCP narrowing and some intercarpal narrowing.  No erosive changes were noted.  Chronic pain of both knees -she continues to have pain and discomfort in her bilateral knee joints.  Plan: XR KNEE 3 VIEW RIGHT, XR KNEE 3 VIEW LEFT.  The x-ray showed bilateral moderate chondromalacia patella.  Pain in both feet -she complains of pain and discomfort in her bilateral feet.  Plan: XR Foot 2 Views Right, XR Foot 2 Views Left.  The x-ray showed MTP and PIP narrowing.  No erosive changes were noted.  Sicca syndrome (HCC)-she has chronic sicca symptoms.  Chronic laryngitis-she states she has had laryngitis for many years.  She has seen ENT in the past.  She was referred to [facility] but she did not keep the appointment.  Raynaud's phenomenon without gangrene-during the winter months.  Other fatigue - Plan: CK, TSH  COVID-19 virus infection-she had COVID-19 infection and January.  She states she skipped Morrie Sheldon herself for couple of weeks and then restarted.  Osteopenia of multiple sites-I do not have previous bone density report.  Gastroesophageal reflux disease without esophagitis-she is on pantoprazole.  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  She does not recall which year but she states she took it before Remicade infusions were started.  Mild episode of recurrent major depressive disorder ([place])  Mixed hyperlipidemia  Orders: Orders Placed This Encounter  Procedures  . XR Hand 2 View Right  . XR Hand 2 View  Left  . XR KNEE 3 VIEW RIGHT  . XR KNEE 3 VIEW LEFT  . XR Foot 2 Views Right  . XR Foot 2 Views Left  . DG Chest 2 View  . CBC with Differential/Platelet  . COMPLETE METABOLIC PANEL WITH GFR  . Sedimentation rate  . CK  . TSH  . Rheumatoid factor  . Cyclic citrul peptide antibody, IgG  . 14-3-3 eta Protein  . ANA  . Hepatitis B core antibody, IgM  . Hepatitis B surface  antigen  . Hepatitis C antibody  . HIV Antibody (routine testing w rflx)  . QuantiFERON-TB Gold Plus  . Serum protein electrophoresis with reflex  . IgG, IgA, IgM  . CBC with Differential/Platelet  . COMPLETE METABOLIC PANEL WITH GFR  . Lipid panel   No orders of the defined types were placed in this encounter.   Face-to-face time spent with patient was 60 minutes. Greater than 50% of time was spent in counseling and coordination of care.  Follow-Up Instructions: Return for Rheumatoid arthritis.   Bo Merino, MD  Note - This record has been created using Editor, commissioning.  Chart creation errors have been sought, but may not always  have been located. Such creation errors do not reflect on  the standard of medical care.

## 2019-04-06 ENCOUNTER — Ambulatory Visit: Payer: Self-pay

## 2019-04-06 ENCOUNTER — Other Ambulatory Visit: Payer: Self-pay

## 2019-04-06 ENCOUNTER — Encounter: Payer: Self-pay | Admitting: Rheumatology

## 2019-04-06 ENCOUNTER — Ambulatory Visit (HOSPITAL_COMMUNITY)
Admission: RE | Admit: 2019-04-06 | Discharge: 2019-04-06 | Disposition: A | Discharge status: Home / Self Care | Payer: 59 | Source: Ambulatory Visit | Attending: Rheumatology | Admitting: Rheumatology

## 2019-04-06 ENCOUNTER — Telehealth: Payer: Self-pay

## 2019-04-06 ENCOUNTER — Ambulatory Visit: Payer: 59 | Admitting: Rheumatology

## 2019-04-06 VITALS — BP 127/83 | HR 69 | Resp 13 | Ht 63.0 in | Wt 143.0 lb

## 2019-04-06 DIAGNOSIS — E782 Mixed hyperlipidemia: Secondary | ICD-10-CM

## 2019-04-06 DIAGNOSIS — M25561 Pain in right knee: Secondary | ICD-10-CM | POA: Diagnosis not present

## 2019-04-06 DIAGNOSIS — G8929 Other chronic pain: Secondary | ICD-10-CM | POA: Diagnosis not present

## 2019-04-06 DIAGNOSIS — M79641 Pain in right hand: Secondary | ICD-10-CM | POA: Diagnosis not present

## 2019-04-06 DIAGNOSIS — F330 Major depressive disorder, recurrent, mild: Secondary | ICD-10-CM

## 2019-04-06 DIAGNOSIS — J370 Chronic laryngitis: Secondary | ICD-10-CM

## 2019-04-06 DIAGNOSIS — M79642 Pain in left hand: Secondary | ICD-10-CM | POA: Diagnosis not present

## 2019-04-06 DIAGNOSIS — M0609 Rheumatoid arthritis without rheumatoid factor, multiple sites: Secondary | ICD-10-CM | POA: Diagnosis not present

## 2019-04-06 DIAGNOSIS — M3500 Sicca syndrome, unspecified: Secondary | ICD-10-CM

## 2019-04-06 DIAGNOSIS — Z79899 Other long term (current) drug therapy: Secondary | ICD-10-CM | POA: Diagnosis present

## 2019-04-06 DIAGNOSIS — R5383 Other fatigue: Secondary | ICD-10-CM

## 2019-04-06 DIAGNOSIS — M79671 Pain in right foot: Secondary | ICD-10-CM | POA: Diagnosis not present

## 2019-04-06 DIAGNOSIS — R7611 Nonspecific reaction to tuberculin skin test without active tuberculosis: Secondary | ICD-10-CM

## 2019-04-06 DIAGNOSIS — M79672 Pain in left foot: Secondary | ICD-10-CM

## 2019-04-06 DIAGNOSIS — U071 COVID-19: Secondary | ICD-10-CM

## 2019-04-06 DIAGNOSIS — I7300 Raynaud's syndrome without gangrene: Secondary | ICD-10-CM

## 2019-04-06 DIAGNOSIS — M25562 Pain in left knee: Secondary | ICD-10-CM

## 2019-04-06 DIAGNOSIS — M8589 Other specified disorders of bone density and structure, multiple sites: Secondary | ICD-10-CM

## 2019-04-06 DIAGNOSIS — K219 Gastro-esophageal reflux disease without esophagitis: Secondary | ICD-10-CM

## 2019-04-06 NOTE — Patient Instructions (Signed)
Standing Labs We placed an order today for your standing lab work.    Please come back and get your standing labs in June and every 3 months.   We have open lab daily Monday through Thursday from 8:30-12:30 PM and 1:30-4:30 PM and Friday from 8:30-12:30 PM and 1:30-4:00 PM at the office of Dr. Kodi Steil.   You may experience shorter wait times on Monday and Friday afternoons. The office is located at [address], [address], [place], [place] [address] No appointment is necessary.   Labs are drawn by [facility].  You may receive a bill from [facility] for your lab work.  If you wish to have your labs drawn at another location, please call the office 24 hours in advance to send orders.  If you have any questions regarding directions or hours of operation,  please call [phone].   Just as a reminder please drink plenty of water prior to coming for your lab work. Thanks!  

## 2019-04-06 NOTE — Progress Notes (Signed)
Chest x-ray is unremarkable.

## 2019-04-06 NOTE — Telephone Encounter (Signed)
Patient was seen as a new patient today, per Dr. Estanislado Pandy we can refill Methotrexate and xeljanz pending lab results and chest x-ray. Thanks!

## 2019-04-09 MED ORDER — XELJANZ XR 11 MG PO TB24: 11.0000 mg | ORAL_TABLET | Freq: Every day | ORAL | 0 refills | Status: DC

## 2019-04-09 MED ORDER — FOLIC ACID 1 MG PO TABS: 1.0000 mg | ORAL_TABLET | Freq: Every day | ORAL | 3 refills | Status: DC

## 2019-04-09 MED ORDER — METHOTREXATE 2.5 MG PO TABS: 15.0000 mg | ORAL_TABLET | ORAL | 0 refills | Status: DC

## 2019-04-09 NOTE — Progress Notes (Signed)
I will discuss results at the follow-up visit.

## 2019-04-09 NOTE — Telephone Encounter (Signed)
Per office note MTX 6 tablets once weekly, folic acid 1 mg po daily, Xeljanz 11 mg po daily.   Labs and chest x-ray resulted. Prescriptions sent to the pharmacy.

## 2019-04-12 LAB — CBC WITH DIFFERENTIAL/PLATELET
Absolute Monocytes: 249 cells/uL (ref 200–950)
Basophils Absolute: 9 cells/uL (ref 0–200)
Basophils Relative: 0.2 %
Eosinophils Absolute: 160 cells/uL (ref 15–500)
Eosinophils Relative: 3.4 %
HCT: 41.9 % (ref 35.0–45.0)
Hemoglobin: 13.8 g/dL (ref 11.7–15.5)
Lymphs Abs: 804 cells/uL — ABNORMAL LOW (ref 850–3900)
MCH: 31.2 pg (ref 27.0–33.0)
MCHC: 32.9 g/dL (ref 32.0–36.0)
MCV: 94.6 fL (ref 80.0–100.0)
MPV: 10.5 fL (ref 7.5–12.5)
Monocytes Relative: 5.3 %
Neutro Abs: 3478 cells/uL (ref 1500–7800)
Neutrophils Relative %: 74 %
Platelets: 214 10*3/uL (ref 140–400)
RBC: 4.43 10*6/uL (ref 3.80–5.10)
RDW: 13.1 % (ref 11.0–15.0)
Total Lymphocyte: 17.1 %
WBC: 4.7 10*3/uL (ref 3.8–10.8)

## 2019-04-12 LAB — COMPLETE METABOLIC PANEL WITH GFR
AG Ratio: 1.6 (calc) (ref 1.0–2.5)
ALT: 19 U/L (ref 6–29)
AST: 23 U/L (ref 10–35)
Albumin: 4.2 g/dL (ref 3.6–5.1)
Alkaline phosphatase (APISO): 64 U/L (ref 37–153)
BUN: 17 mg/dL (ref 7–25)
CO2: 29 mmol/L (ref 20–32)
Calcium: 9.2 mg/dL (ref 8.6–10.4)
Chloride: 109 mmol/L (ref 98–110)
Creat: 0.72 mg/dL (ref 0.50–1.05)
GFR, Est African American: 110 mL/min/{1.73_m2} (ref >=60)
GFR, Est Non African American: 95 mL/min/{1.73_m2} (ref >=60)
Globulin: 2.6 g/dL (calc) (ref 1.9–3.7)
Glucose, Bld: 95 mg/dL (ref 65–99)
Potassium: 4.3 mmol/L (ref 3.5–5.3)
Sodium: 137 mmol/L (ref 135–146)
Total Bilirubin: 0.4 mg/dL (ref 0.2–1.2)
Total Protein: 6.8 g/dL (ref 6.1–8.1)

## 2019-04-12 LAB — HEPATITIS C ANTIBODY
Hepatitis C Ab: NONREACTIVE
SIGNAL TO CUT-OFF: 0.02 (ref <1.00)

## 2019-04-12 LAB — PROTEIN ELECTROPHORESIS, SERUM, WITH REFLEX
Albumin ELP: 4.3 g/dL (ref 3.8–4.8)
Alpha 1: 0.3 g/dL (ref 0.2–0.3)
Alpha 2: 0.6 g/dL (ref 0.5–0.9)
Beta 2: 0.4 g/dL (ref 0.2–0.5)
Beta Globulin: 0.4 g/dL (ref 0.4–0.6)
Gamma Globulin: 1.2 g/dL (ref 0.8–1.7)
Total Protein: 7.2 g/dL (ref 6.1–8.1)

## 2019-04-12 LAB — LIPID PANEL
Cholesterol: 205 mg/dL — ABNORMAL HIGH (ref <200)
HDL: 72 mg/dL (ref >=50)
LDL Cholesterol (Calc): 111 mg/dL (calc) — ABNORMAL HIGH
Non-HDL Cholesterol (Calc): 133 mg/dL (calc) — ABNORMAL HIGH (ref <130)
Total CHOL/HDL Ratio: 2.8 (calc) (ref <5.0)
Triglycerides: 109 mg/dL (ref <150)

## 2019-04-12 LAB — CYCLIC CITRUL PEPTIDE ANTIBODY, IGG: Cyclic Citrullin Peptide Ab: >250 UNITS — ABNORMAL HIGH

## 2019-04-12 LAB — IGG, IGA, IGM
IgG (Immunoglobin G), Serum: 1290 mg/dL (ref 600–1640)
IgM, Serum: 179 mg/dL (ref 50–300)
Immunoglobulin A: 247 mg/dL (ref 47–310)

## 2019-04-12 LAB — QUANTIFERON-TB GOLD PLUS
Mitogen-NIL: >10 IU/mL
NIL: 0.1 IU/mL
QuantiFERON-TB Gold Plus: NEGATIVE
TB1-NIL: 0.07 IU/mL
TB2-NIL: 0.09 IU/mL

## 2019-04-12 LAB — CK: Total CK: 81 U/L (ref 29–143)

## 2019-04-12 LAB — HIV ANTIBODY (ROUTINE TESTING W REFLEX): HIV 1&2 Ab, 4th Generation: NONREACTIVE

## 2019-04-12 LAB — 14-3-3 ETA PROTEIN: 14-3-3 eta Protein: 0.7 ng/mL — ABNORMAL HIGH (ref <0.2)

## 2019-04-12 LAB — HEPATITIS B CORE ANTIBODY, IGM: Hep B C IgM: NONREACTIVE

## 2019-04-12 LAB — HEPATITIS B SURFACE ANTIGEN: Hepatitis B Surface Ag: NONREACTIVE

## 2019-04-12 LAB — SEDIMENTATION RATE: Sed Rate: 19 mm/h (ref 0–30)

## 2019-04-12 LAB — ANTI-NUCLEAR AB-TITER (ANA TITER): ANA Titer 1: 1:80 {titer} — ABNORMAL HIGH

## 2019-04-12 LAB — RHEUMATOID FACTOR: Rheumatoid fact SerPl-aCnc: 675 IU/mL — ABNORMAL HIGH (ref <14)

## 2019-04-12 LAB — ANA: Anti Nuclear Antibody (ANA): POSITIVE — AB

## 2019-04-12 LAB — TSH: TSH: 1.64 mIU/L

## 2019-04-27 NOTE — Progress Notes (Signed)
Office Visit Note  Patient: Jenna Gordon             Date of Birth: 11-Nov-1965           MRN: [id number]             PCP: Shirley, [place], DO Referring: Shirley, [place], DO Visit Date: 04/29/2019 Occupation: @GUAROCC @  Subjective:  Pain and swelling in joints.   History of Present Illness: Jenna Gordon is a 54 y.o. female with seropositive rheumatoid arthritis.  She states she continues to have pain and swelling in her joints.  She also has noticed rheumatoid nodules which come and go.  She has been having increased discomfort in her wrist joints in her hands.  She states she used to take prednisone on as needed basis.  She has not taken prednisone recently.  Her last prednisone taper was in January 2021.  She complains of discomfort in her bilateral hands.  She also has discomfort in her arms but not in the elbow joints.  She has been complaining of frequency and painful urination.  Activities of Daily Living:  Patient reports morning stiffness for 1.5-2 hours.   Patient Reports nocturnal pain.  Difficulty dressing/grooming: Denies Difficulty climbing stairs: Denies Difficulty getting out of chair: Reports Difficulty using hands for taps, buttons, cutlery, and/or writing: Reports  Review of Systems  Constitutional: Positive for fatigue. Negative for night sweats, weight gain and weight loss.  HENT: Positive for mouth sores, mouth dryness and nose dryness. Negative for trouble swallowing and trouble swallowing.        Nose sores  Eyes: Positive for dryness. Negative for pain, redness and visual disturbance.  Respiratory: Negative for cough, shortness of breath and difficulty breathing.   Cardiovascular: Negative for chest pain, palpitations, hypertension, irregular heartbeat and swelling in legs/feet.  Gastrointestinal: Negative for blood in stool, constipation and diarrhea.  Endocrine: Positive for increased urination.  Genitourinary: Positive for painful urination. Negative  for vaginal dryness.  Musculoskeletal: Positive for arthralgias, joint pain, joint swelling, myalgias, morning stiffness, muscle tenderness and myalgias. Negative for muscle weakness.  Skin: Negative for color change, rash, hair loss, redness, skin tightness, ulcers and sensitivity to sunlight.  Allergic/Immunologic: Negative for susceptible to infections.  Neurological: Negative for dizziness, numbness, headaches, memory loss, night sweats and weakness.  Hematological: Negative for bruising/bleeding tendency and swollen glands.  Psychiatric/Behavioral: Positive for sleep disturbance. Negative for depressed mood and confusion. The patient is not nervous/anxious.     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)    Positive TB test    pt states cxr negative was tested due to treatment for rheumatoid  arthritis since test +  , was told to take medication for tb for 9 months before going back on humira injection   Rheumatoid arthritis(714.0)    Dr. Dondra Spry, [facility]   Sjogren's disease [facility])    SUI (stress  urinary incontinence, female)    Wears glasses     Family History  Problem Relation Age of Onset   Hypertension Mother    CVA Mother        TIA   Cystic fibrosis Mother    Stomach cancer Father    Diabetes Father    Hypertension Father    Hypertension Brother    Hypertension Brother    Healthy Daughter    Healthy Daughter    Past Surgical History:  Procedure Laterality Date   CARPAL TUNNEL RELEASE Bilateral 2007   CESAREAN SECTION  x2  last one 1999   GLAUCOMA SURGERY Bilateral 2007   laser   HYSTEROSCOPY WITH D & C  03-02-2010   MASS EXCISION Left 05/21/2013   Procedure: EXCISION MASS LEFT LONG FINGER;  Surgeon: Linna Hoff, MD;  Location: [facility];  Service: Orthopedics;  Laterality: Left;   SHOULDER ARTHROSCOPY WITH OPEN ROTATOR CUFF REPAIR Right 2012   TENOSYNOVECTOMY Left 05/21/2013   Procedure: LEFT INDEX FINGER TENOSYNOVECTOMY;  Surgeon: Linna Hoff, MD;  Location: [facility];  Service: Orthopedics;  Laterality: Left;   TRIGGER FINGER RELEASE  2009   left middle finger   TYMPANOPLASTY Left 1993   Social History   Social History Narrative   Lives with Jenna Gordon 86yo, Daughter, Jenna Gordon 21yo, Pets [employer] and [place].    Immunization History  Administered Date(s) Administered   Influenza Split 10/02/2011   Influenza Whole 11/05/2008, 10/24/2009   Influenza, High Dose Seasonal PF 09/23/2014   Influenza,inj,Quad PF,6+ Mos 10/14/2012, 10/15/2016, 09/24/2017, 10/23/2018   Influenza-Unspecified 10/02/2011, 10/14/2012, 11/23/2015, 10/15/2016, 09/24/2017   PPD Test 02/11/2013   Pneumococcal Conjugate-13 07/27/2016   Td 08/06/2002   Tdap 02/06/2019     Objective: Vital Signs: BP 126/88 (BP Location: Right Arm, Patient Position: Sitting, Cuff Size: Normal)    Pulse 78    Resp 14    Ht 5' 3"  (1.6 m)    Wt 143 lb 12.8 oz (65.2 kg)    LMP 05/05/2014 (Approximate)    BMI 25.47 kg/m    Physical Exam Vitals and  nursing note reviewed.  Constitutional:      Appearance: She is well-developed.  HENT:     Head: Normocephalic and atraumatic.  Eyes:     Conjunctiva/sclera: Conjunctivae normal.  Cardiovascular:     Rate and Rhythm: Normal rate and regular rhythm.     Heart sounds: Normal heart sounds.  Pulmonary:     Effort: Pulmonary effort is normal.     Breath sounds: Normal breath sounds.  Abdominal:     General: Bowel sounds are normal.     Palpations: Abdomen is soft.  Musculoskeletal:     Cervical back: Normal range of motion.  Lymphadenopathy:     Cervical: No cervical adenopathy.  Skin:    General: Skin is warm and dry.     Capillary Refill: Capillary refill takes less than 2 seconds.  Neurological:     Mental Status: She is alert and oriented to person, place, and time.  Psychiatric:        Behavior: Behavior normal.      Musculoskeletal Exam: C-spine was in good range of motion.  Shoulder joints and  elbow joints with good range of motion.  She has good range of motion of her wrist joints.  She has thickening over bilateral MCPs and PIP joints.  No synovitis was noted.  Ulnar deviation was noted.  Hip joints and knee joints with good range of motion.  She had no tenderness over ankle joints or MTPs.  CDAI Exam: CDAI Score: 1.1  Patient Global: 7 mm; Provider Global: 4 mm Swollen: 0 ; Tender: 0  Joint Exam 04/29/2019   No joint exam has been documented for this visit   There is currently no information documented on the homunculus. Go to the Rheumatology activity and complete the homunculus joint exam.  Investigation: No additional findings.  Imaging: DG Chest 2 View  Result Date: 04/06/2019 CLINICAL DATA:  Pre immunosuppressive therapy. EXAM: CHEST - 2 VIEW COMPARISON:  Chest x-ray dated 02/15/2014 FINDINGS: The heart size and pulmonary vascularity are normal and the lungs are clear. No significant bone abnormality. Benign bone island in the posterior aspect of the right  fifth rib, unchanged. IMPRESSION: Normal chest. Electronically Signed   By: Lorriane Shire M.D.   On: 04/06/2019 11:12   XR Foot 2 Views Left  Result Date: 04/06/2019 First MTP narrowing was noted.  PIP and DIP narrowing was noted.  Juxta-articular osteopenia was noted.  No erosive changes were noted.  No intertarsal or tibiotalar joint space narrowing was noted. 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  XR Foot 2 Views Right  Result Date: 04/06/2019 First MTP narrowing was noted.  PIP and DIP narrowing was noted.  Juxta-articular osteopenia was noted.  No erosive changes were noted.  No intertarsal or tibiotalar joint space narrowing was noted. 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  XR Hand 2 View Left  Result Date: 04/06/2019 Juxta-articular osteopenia was noted.  Narrowing of first second and third MCPs were noted.  Narrowing of all PIP and DIPs was noted.  Metacarpocarpal intercarpal joint space narrowing was noted.  No erosive changes were noted. Impression: These findings are consistent with rheumatoid arthritis of the hand.  XR Hand 2 View Right  Result Date: 04/06/2019 Juxta-articular osteopenia was noted.  Subluxation of first MCP joint was noted.  Narrowing of first, second, third and fourth MCPs was noted.  Narrowing of PIP joints was noted.  Mild intercarpal radiocarpal joint space narrowing was noted.  No erosive changes were noted. Impression: These findings are consistent with rheumatoid arthritis of the hand.  XR KNEE 3 VIEW LEFT  Result Date: 04/06/2019 No significant medial lateral compartment narrowing was noted.  Moderate patellofemoral narrowing was noted. 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  XR KNEE 3 VIEW RIGHT  Result Date: 04/06/2019 No significant medial lateral compartment narrowing was noted.   Moderate patellofemoral narrowing was noted. 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   Recent Labs: Lab Results  Component Value Date   WBC 4.7 04/06/2019   HGB 13.8 04/06/2019   PLT 214 04/06/2019   NA 137 04/06/2019   K 4.3 04/06/2019   CL 109 04/06/2019   CO2 29 04/06/2019   GLUCOSE 95 04/06/2019   BUN 17 04/06/2019   CREATININE 0.72 04/06/2019   BILITOT 0.4 04/06/2019   ALKPHOS 73 04/11/2012   AST 23 04/06/2019   ALT 19 04/06/2019   PROT 6.8 04/06/2019   PROT 7.2 04/06/2019   ALBUMIN 4.1 04/11/2012  CALCIUM 9.2 04/06/2019   GFRAA 110 04/06/2019   QFTBGOLDPLUS NEGATIVE 04/06/2019   April 06, 2019 SPEP normal, immunoglobulins normal, hepatitis B-, hepatitis C negative, HIV negative, TB Gold negative TSH normal, CK 81, LDL 111  ANA 1: 80 homogeneous, RF 675, anti-CCP> 250, 14 3 3  eta 0.7, ESR 19 Speciality Comments: No specialty comments available.  Procedures:  No procedures performed Allergies: Cefzil [cefprozil], Macrobid [nitrofurantoin], and Sulfa antibiotics   Assessment / Plan:     Visit Diagnoses: Rheumatoid arthritis involving multiple sites with positive rheumatoid factor ([place]) - +RF,+anti-CCP, +14-3-3n, Diagnosed in 2006.  She das severe RA poorly controlled for many years.  Inadequate response-Enbrel and Humira.Intolerance to Remicade.  She has been on Xeljanz XR 11 mg daily with methotrexate 6 tablets p.o. weekly.  She states she takes prednisone intermittently for flares.  She states her last flare was in January when she took prednisone.  I do not see any synovitis on examination today.  High risk medication use - Xeljanz 11 mg XR daily and methotrexate 6 tablets p.o. weekly, folic acid 1 mg p.o. daily combination since 2018.  We discussed possibly increasing methotrexate to 8 tablets weekly in case her symptoms get worse.  As I do not see any synovitis we decided to continue on the current regimen.  We  will continue to monitor labs every 3 months.  She would like to have labs with a follow-up visits every 3 months.  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  Chondromalacia of both patellae-muscle strengthening was discussed.  Sicca syndrome (HCC)-over-the-counter products were discussed.  Chronic laryngitis - Patient had ENT evaluation in the past.  She also had an appointment at [facility] which she did not keep.  Raynaud's phenomenon without gangrene-good capillary refill was noted.  Dysuria -patient states that she has had frequent urinary tract infections in the past and needs to see urologist.  She has been having some urinary frequency and dysuria.  Will obtain UA today.  I have advised her to establish with a urologist in case she has UTI.  Plan: Urinalysis, Routine w reflex microscopic  Other fatigue-due to chronic disease.  COVID-19 virus infection - January 2021  Osteopenia of multiple sites-she is on calcium and vitamin D.  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  Mild episode of recurrent major depressive disorder ([place])  Mixed hyperlipidemia  Orders: Orders Placed This Encounter  Procedures   Urinalysis, Routine w reflex microscopic   No orders of the defined types were placed in this encounter.   Face-to-face time spent with patient was 30 minutes. Greater than 50% of time was spent in counseling and coordination of care.  Follow-Up Instructions: Return in about 3 months (around 07/29/2019) for Rheumatoid arthritis.   Bo Merino, MD  Note - This record has been created using Editor, commissioning.  Chart creation errors have been sought, but may not always  have been located. Such creation errors do not reflect on  the standard of medical care.

## 2019-04-29 ENCOUNTER — Other Ambulatory Visit: Payer: Self-pay

## 2019-04-29 ENCOUNTER — Ambulatory Visit: Payer: 59 | Admitting: Rheumatology

## 2019-04-29 ENCOUNTER — Encounter: Payer: Self-pay | Admitting: Rheumatology

## 2019-04-29 VITALS — BP 126/88 | HR 78 | Resp 14 | Ht 63.0 in | Wt 143.8 lb

## 2019-04-29 DIAGNOSIS — M0579 Rheumatoid arthritis with rheumatoid factor of multiple sites without organ or systems involvement: Secondary | ICD-10-CM | POA: Diagnosis not present

## 2019-04-29 DIAGNOSIS — R300 Dysuria: Secondary | ICD-10-CM

## 2019-04-29 DIAGNOSIS — M0630 Rheumatoid nodule, unspecified site: Secondary | ICD-10-CM | POA: Diagnosis not present

## 2019-04-29 DIAGNOSIS — F330 Major depressive disorder, recurrent, mild: Secondary | ICD-10-CM

## 2019-04-29 DIAGNOSIS — R5383 Other fatigue: Secondary | ICD-10-CM

## 2019-04-29 DIAGNOSIS — Z79899 Other long term (current) drug therapy: Secondary | ICD-10-CM | POA: Diagnosis not present

## 2019-04-29 DIAGNOSIS — M8589 Other specified disorders of bone density and structure, multiple sites: Secondary | ICD-10-CM

## 2019-04-29 DIAGNOSIS — M2242 Chondromalacia patellae, left knee: Secondary | ICD-10-CM

## 2019-04-29 DIAGNOSIS — E782 Mixed hyperlipidemia: Secondary | ICD-10-CM

## 2019-04-29 DIAGNOSIS — R7611 Nonspecific reaction to tuberculin skin test without active tuberculosis: Secondary | ICD-10-CM

## 2019-04-29 DIAGNOSIS — M2241 Chondromalacia patellae, right knee: Secondary | ICD-10-CM | POA: Diagnosis not present

## 2019-04-29 DIAGNOSIS — I7300 Raynaud's syndrome without gangrene: Secondary | ICD-10-CM

## 2019-04-29 DIAGNOSIS — M3500 Sicca syndrome, unspecified: Secondary | ICD-10-CM

## 2019-04-29 DIAGNOSIS — J370 Chronic laryngitis: Secondary | ICD-10-CM

## 2019-04-29 DIAGNOSIS — U071 COVID-19: Secondary | ICD-10-CM

## 2019-04-29 DIAGNOSIS — K219 Gastro-esophageal reflux disease without esophagitis: Secondary | ICD-10-CM

## 2019-04-29 NOTE — Patient Instructions (Signed)
Standing Labs We placed an order today for your standing lab work.    Please come back and get your standing labs in June and every 3 months.   We have open lab daily Monday through Thursday from 8:30-12:30 PM and 1:30-4:30 PM and Friday from 8:30-12:30 PM and 1:30-4:00 PM at the office of Dr. Ranjit Ashurst.   You may experience shorter wait times on Monday and Friday afternoons. The office is located at [address], [address], [place], [place] [address] No appointment is necessary.   Labs are drawn by [facility].  You may receive a bill from [facility] for your lab work.  If you wish to have your labs drawn at another location, please call the office 24 hours in advance to send orders.  If you have any questions regarding directions or hours of operation,  please call [phone].   Just as a reminder please drink plenty of water prior to coming for your lab work. Thanks!  

## 2019-04-30 LAB — URINALYSIS, ROUTINE W REFLEX MICROSCOPIC
Bilirubin Urine: NEGATIVE
Glucose, UA: NEGATIVE
Hgb urine dipstick: NEGATIVE
Ketones, ur: NEGATIVE
Leukocytes,Ua: NEGATIVE
Nitrite: NEGATIVE
Protein, ur: NEGATIVE
Specific Gravity, Urine: 1.004 (ref 1.001–1.03)
pH: 7 (ref 5.0–8.0)

## 2019-04-30 NOTE — Progress Notes (Signed)
UA is negative

## 2019-05-27 ENCOUNTER — Other Ambulatory Visit: Payer: Self-pay

## 2019-05-27 ENCOUNTER — Encounter: Payer: Self-pay | Admitting: Family Medicine

## 2019-05-27 ENCOUNTER — Ambulatory Visit: Payer: 59 | Admitting: Family Medicine

## 2019-05-27 DIAGNOSIS — F339 Major depressive disorder, recurrent, unspecified: Secondary | ICD-10-CM

## 2019-05-27 DIAGNOSIS — K1370 Unspecified lesions of oral mucosa: Secondary | ICD-10-CM

## 2019-05-27 MED ORDER — SERTRALINE HCL 50 MG PO TABS: 50.0000 mg | ORAL_TABLET | Freq: Every day | ORAL | 3 refills | Status: DC

## 2019-05-27 MED ORDER — SERTRALINE HCL 50 MG PO TABS: 50.0000 mg | ORAL_TABLET | Freq: Every day | ORAL | 1 refills | Status: DC

## 2019-05-27 NOTE — Patient Instructions (Signed)
Thank you for coming to see me today. It was a pleasure! Today we talked about:   I suggest switching to Sensodyne toothpaste for the areas inside of your mouth.  I have also sent a prescription of Zoloft to your pharmacy.  Please schedule an appointment to follow-up with me in 4 weeks.  You may message me on my chart if you have any concerns or questions.   If you have any questions or concerns, please do not hesitate to call the office at [phone].  Take Care,   [place] Moody Robben, DO
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
## 2019-05-29 ENCOUNTER — Telehealth: Payer: Self-pay | Admitting: Family Medicine

## 2019-05-29 NOTE — Telephone Encounter (Signed)
Patient is requesting a note to travel confirming that she had COVID in January and is recovered and is safe to travel! Please give patient a call at [phone]

## 2019-05-29 NOTE — Telephone Encounter (Signed)
Letter created and given to patient as she is traveling on Monday and needed the note today.  Chrles Selley,CMA

## 2019-06-01 ENCOUNTER — Encounter: Payer: Self-pay | Admitting: Family Medicine

## 2019-06-01 DIAGNOSIS — F339 Major depressive disorder, recurrent, unspecified: Secondary | ICD-10-CM | POA: Insufficient documentation

## 2019-06-01 DIAGNOSIS — K1370 Unspecified lesions of oral mucosa: Secondary | ICD-10-CM | POA: Insufficient documentation
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
## 2019-06-01 NOTE — Assessment & Plan Note (Signed)
Patient with lesion on inside of bottom lip.  Nonpainful which is not consistent with herpes.  Likely aphthous ulcer due to some irritation in the area of the mouth.  Patient reassured given that it does not bother her. Will try to switch to Sensodyne toothpaste to see if this helps decrease recurrence.

## 2019-06-26 ENCOUNTER — Encounter: Payer: Self-pay | Admitting: Family Medicine

## 2019-06-26 ENCOUNTER — Ambulatory Visit (INDEPENDENT_AMBULATORY_CARE_PROVIDER_SITE_OTHER): Payer: 59 | Admitting: Family Medicine

## 2019-06-26 ENCOUNTER — Other Ambulatory Visit: Payer: Self-pay

## 2019-06-26 DIAGNOSIS — F339 Major depressive disorder, recurrent, unspecified: Secondary | ICD-10-CM

## 2019-06-26 NOTE — Assessment & Plan Note (Signed)
Patient to continue with Zoloft.  She will start taking this at night in order to determine if this is what has been calling using her to feel plenty during the day.  She is also going to cut down on her coffee intake.  Scheduled for follow-up on June 9 at 8 AM with me in order to see how she is doing with this medication.  Signed patient out to Dr. Ardelia Mems who will take over as PCP after my graduation.

## 2019-06-26 NOTE — Progress Notes (Signed)
° °  SUBJECTIVE:   CHIEF COMPLAINT / HPI:   Depression: Patient reports that she has only been able to take the Zoloft for about 1 week.  She has not noticed any significant change in that 1 week.  She states that she has been "feeling funny" at work.  She does state that she believes this might be due to her drinking coffee for the first time in a while due to her having decreased energy and needing to get through the workday.  Patient denies any palpitations, chest pain, shortness of breath.  Patient reports that her biggest trouble with the depression is that she is having trouble concentrating and when she comes home from work does not feel that she needs to do anything.  She suffers from guilt of taking time for herself and not being able to complete things at home.  She continues to follow with therapy which has helped her greatly.  She denies any SI/HI.  She is hopeful that the medication will help her feel more like herself again.  PERTINENT  PMH / PSH: Rheumatoid arthritis, depression, osteopenia  OBJECTIVE:  BP 104/70    Pulse 70    Ht 5\' 3"  (1.6 m)    Wt 149 lb (67.6 kg)    LMP 05/05/2014 (Approximate)    BMI 26.39 kg/m   General: NAD, pleasant Neck: Supple Respiratory:  normal work of breathing Psych: AOx3, appropriate affect, tearful  ASSESSMENT/PLAN:   Depression, recurrent (HCC) Patient to continue with Zoloft.  She will start taking this at night in order to determine if this is what has been calling using her to feel plenty during the day.  She is also going to cut down on her coffee intake.  Scheduled for follow-up on June 9 at 8 AM with me in order to see how she is doing with this medication.  Signed patient out to Dr. Ardelia Mems who will take over as PCP after my graduation.    [place] Aeva Posey, DO PGY-3, [facility]

## 2019-06-29 ENCOUNTER — Other Ambulatory Visit: Payer: Self-pay | Admitting: *Deleted

## 2019-06-29 DIAGNOSIS — Z79899 Other long term (current) drug therapy: Secondary | ICD-10-CM

## 2019-06-30 LAB — CBC WITH DIFFERENTIAL/PLATELET
Absolute Monocytes: 353 cells/uL (ref 200–950)
Basophils Absolute: 10 cells/uL (ref 0–200)
Basophils Relative: 0.2 %
Eosinophils Absolute: 162 cells/uL (ref 15–500)
Eosinophils Relative: 3.3 %
HCT: 41 % (ref 35.0–45.0)
Hemoglobin: 13.8 g/dL (ref 11.7–15.5)
Lymphs Abs: 1495 cells/uL (ref 850–3900)
MCH: 31.7 pg (ref 27.0–33.0)
MCHC: 33.7 g/dL (ref 32.0–36.0)
MCV: 94 fL (ref 80.0–100.0)
MPV: 10.6 fL (ref 7.5–12.5)
Monocytes Relative: 7.2 %
Neutro Abs: 2881 cells/uL (ref 1500–7800)
Neutrophils Relative %: 58.8 %
Platelets: 184 10*3/uL (ref 140–400)
RBC: 4.36 10*6/uL (ref 3.80–5.10)
RDW: 13 % (ref 11.0–15.0)
Total Lymphocyte: 30.5 %
WBC: 4.9 10*3/uL (ref 3.8–10.8)

## 2019-06-30 LAB — COMPLETE METABOLIC PANEL WITH GFR
AG Ratio: 1.6 (calc) (ref 1.0–2.5)
ALT: 22 U/L (ref 6–29)
AST: 24 U/L (ref 10–35)
Albumin: 4.2 g/dL (ref 3.6–5.1)
Alkaline phosphatase (APISO): 66 U/L (ref 37–153)
BUN/Creatinine Ratio: 16 (calc) (ref 6–22)
BUN: 18 mg/dL (ref 7–25)
CO2: 26 mmol/L (ref 20–32)
Calcium: 9.2 mg/dL (ref 8.6–10.4)
Chloride: 107 mmol/L (ref 98–110)
Creat: 1.1 mg/dL — ABNORMAL HIGH (ref 0.50–1.05)
GFR, Est African American: 65 mL/min/{1.73_m2} (ref >=60)
GFR, Est Non African American: 56 mL/min/{1.73_m2} — ABNORMAL LOW (ref >=60)
Globulin: 2.7 g/dL (calc) (ref 1.9–3.7)
Glucose, Bld: 107 mg/dL — ABNORMAL HIGH (ref 65–99)
Potassium: 4.4 mmol/L (ref 3.5–5.3)
Sodium: 141 mmol/L (ref 135–146)
Total Bilirubin: 0.5 mg/dL (ref 0.2–1.2)
Total Protein: 6.9 g/dL (ref 6.1–8.1)

## 2019-06-30 NOTE — Progress Notes (Signed)
Creatinine is elevated.  Please advise patient to reduce methotrexate to 5 tablets p.o. weekly.  Repeat BMP with GFR in 3 weeks.  Patient should discontinue all ibuprofen.  She had no synovitis at the last visit.

## 2019-07-01 ENCOUNTER — Other Ambulatory Visit: Payer: Self-pay | Admitting: *Deleted

## 2019-07-01 DIAGNOSIS — R5383 Other fatigue: Secondary | ICD-10-CM

## 2019-07-01 DIAGNOSIS — R899 Unspecified abnormal finding in specimens from other organs, systems and tissues: Secondary | ICD-10-CM

## 2019-07-01 DIAGNOSIS — Z79899 Other long term (current) drug therapy: Secondary | ICD-10-CM

## 2019-07-01 MED ORDER — METHOTREXATE 2.5 MG PO TABS: ORAL_TABLET | ORAL | 0 refills | Status: DC

## 2019-07-09 ENCOUNTER — Other Ambulatory Visit: Payer: Self-pay | Admitting: Rheumatology

## 2019-07-09 NOTE — Telephone Encounter (Signed)
Last Visit: 04/29/2019 Next Visit: 08/04/2019 Labs: 06/29/2019 Creatinine is elevated.   Current Dose per last office note on 04/29/2019: Xeljanz 11 mg XR daily   Okay to refill Morrie Sheldon?

## 2019-07-21 ENCOUNTER — Ambulatory Visit (INDEPENDENT_AMBULATORY_CARE_PROVIDER_SITE_OTHER): Payer: 59 | Admitting: Family Medicine

## 2019-07-21 ENCOUNTER — Other Ambulatory Visit: Payer: Self-pay

## 2019-07-21 ENCOUNTER — Encounter: Payer: Self-pay | Admitting: Family Medicine

## 2019-07-21 DIAGNOSIS — Z79899 Other long term (current) drug therapy: Secondary | ICD-10-CM

## 2019-07-21 DIAGNOSIS — F339 Major depressive disorder, recurrent, unspecified: Secondary | ICD-10-CM | POA: Diagnosis not present

## 2019-07-21 DIAGNOSIS — R5383 Other fatigue: Secondary | ICD-10-CM

## 2019-07-21 DIAGNOSIS — R899 Unspecified abnormal finding in specimens from other organs, systems and tissues: Secondary | ICD-10-CM

## 2019-07-21 MED ORDER — DICLOFENAC SODIUM 1 % EX GEL: 4.0000 g | Freq: Four times a day (QID) | CUTANEOUS | 2 refills | Status: DC

## 2019-07-21 NOTE — Patient Instructions (Signed)
Thank you for coming to see me today. It was a pleasure helping with your care! Today we talked about:   I have sent voltaren gel to your pharmacy, if it is not covered you may get this over the counter. You can use this for your joint pain.   Please follow-up with Dr. Ardelia Mems in 4-6 weeks or sooner as needed.  If you have any questions or concerns, please do not hesitate to call the office at [phone].  Take Care,   [place] Hadlea Furuya, DO

## 2019-07-21 NOTE — Progress Notes (Signed)
° °  SUBJECTIVE:   CHIEF COMPLAINT / HPI:   Depression: Patient here for follow-up for depression.  She states that she believes that her mood has improved significantly after increasing Zoloft to 50 mg.  She states that she believes she is able to concentrate more than she has been previously.  She reports feeling as if she had more energy than she previously did.  Patient states that this has been the dose that is previously helped her in the past although she is amenable to go up if she needs to in the future.  Patient denies any active SI/HI.  She reports she feels safe to herself and safe at home.  Patient is going today to have her labs rechecked by her rheumatologist as her creatinine had bumped to her last check.  PERTINENT  PMH / PSH: Rheumatoid arthritis, depression, osteopenia  OBJECTIVE:  BP 110/60    Pulse 79    Ht 5\' 3"  (1.6 m)    Wt 148 lb (67.1 kg)    LMP 05/05/2014 (Approximate)    SpO2 99%    BMI 26.22 kg/m   General: NAD, pleasant Neck: Supple Respiratory:  normal work of breathing Psych: AOx3, appropriate affect  ASSESSMENT/PLAN:   Depression, recurrent (HCC) Continue current plan with Zoloft 50 mg.  Patient to return in 4 to 6 weeks.   Patient provided with Voltaren gel for her knee pain due to her rheumatoid arthritis.  [place] Ocie Stanzione, DO PGY-3, [facility]

## 2019-07-21 NOTE — Assessment & Plan Note (Signed)
Continue current plan with Zoloft 50 mg.  Patient to return in 4 to 6 weeks.

## 2019-07-22 LAB — BASIC METABOLIC PANEL WITH GFR
BUN: 12 mg/dL (ref 7–25)
CO2: 26 mmol/L (ref 20–32)
Calcium: 9.3 mg/dL (ref 8.6–10.4)
Chloride: 106 mmol/L (ref 98–110)
Creat: 0.74 mg/dL (ref 0.50–1.05)
GFR, Est African American: 106 mL/min/{1.73_m2} (ref >=60)
GFR, Est Non African American: 91 mL/min/{1.73_m2} (ref >=60)
Glucose, Bld: 98 mg/dL (ref 65–99)
Potassium: 4.1 mmol/L (ref 3.5–5.3)
Sodium: 139 mmol/L (ref 135–146)

## 2019-07-22 NOTE — Progress Notes (Signed)
BMP normal.

## 2019-07-24 NOTE — Progress Notes (Signed)
Office Visit Note  Patient: Jenna Gordon             Date of Birth: 05-19-1965           MRN: [id number]             PCP: Leeanne Rio, MD Referring: Shirley, [place], DO Visit Date: 08/04/2019 Occupation: @GUAROCC @  Subjective:  Recent flare   History of Present Illness: Jenna Gordon is a 54 y.o. female with history of seropositive rheumatoid arthritis.  She was on Xeljanz 11 mg XR and methotrexate 6 tablets p.o. weekly.  She states we recently decreased her methotrexate to 5 tablets p.o.weekly and is stopped Motrin.  After that she had a small flare with increased pain and discomfort in her hands.  She did not take any prednisone for the flare and the symptoms subsided gradually.  Although she has been experiencing increasing stiffness.  She states she has been experiencing some numbness in her hands at nighttime.  Activities of Daily Living:  Patient reports morning stiffness for 1 hour.   Patient Reports nocturnal pain.  Difficulty dressing/grooming: Denies Difficulty climbing stairs: Denies Difficulty getting out of chair: Denies Difficulty using hands for taps, buttons, cutlery, and/or writing: Reports  Review of Systems  Constitutional: Positive for fatigue. Negative for night sweats, weight gain and weight loss.  HENT: Positive for mouth dryness. Negative for mouth sores, trouble swallowing, trouble swallowing and nose dryness.   Eyes: Positive for dryness. Negative for pain, redness and visual disturbance.  Respiratory: Negative for cough, shortness of breath and difficulty breathing.   Cardiovascular: Negative for chest pain, palpitations, hypertension, irregular heartbeat and swelling in legs/feet.  Gastrointestinal: Negative for blood in stool, constipation and diarrhea.  Endocrine: Negative for increased urination.  Genitourinary: Negative for difficulty urinating and vaginal dryness.  Musculoskeletal: Positive for morning stiffness. Negative for arthralgias,  joint pain, joint swelling, myalgias, muscle weakness, muscle tenderness and myalgias.  Skin: Positive for sensitivity to sunlight. Negative for color change, rash, hair loss, skin tightness and ulcers.  Allergic/Immunologic: Positive for susceptible to infections.  Neurological: Positive for numbness. Negative for dizziness, memory loss, night sweats and weakness.  Hematological: Negative for bruising/bleeding tendency and swollen glands.  Psychiatric/Behavioral: Positive for sleep disturbance. Negative for depressed mood. The patient is not nervous/anxious.     PMFS History:  Patient Active Problem List   Diagnosis Date Noted  . Mouth lesion 06/01/2019  . Depression, recurrent ([place]) 06/01/2019  . Corneal irritation of both eyes 03/04/2019  . Fibroid uterus 06/20/2018  . Grade II internal hemorrhoids 06/20/2018  . Mixed hyperlipidemia 03/14/2016  . Environmental allergies 03/14/2016  . Mild episode of recurrent major depressive disorder ([place]) 03/24/2015  . Gastroesophageal reflux disease without esophagitis 02/24/2015  . Osteopenia 02/24/2015  . 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 Raynaud phenomenon 04/11/2012  . ANEMIA, IRON DEFICIENCY 09/08/2009  . [place] SYNDROME 01/06/2008  . Rheumatoid arthritis of multiple sites with negative rheumatoid factor ([place]) 04/09/2007    Past Medical History:  Diagnosis Date  . Amnesia memory loss 10/14/2012  . Dry eyes   . History of glaucoma    s/p   bilateral eye laser treatment  2007  . 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 Lupus (systemic lupus erythematosus) ([place])   . Positive TB test    pt states cxr negative was tested due to treatment for rheumatoid arthritis since test +  , was told to take medication for tb for 9 months before going back  on humira injection  . Rheumatoid arthritis(714.0)    Dr. Dondra Spry, [facility]  . Sjogren's disease ([place])   . SUI (stress urinary incontinence, female)   . Wears glasses       Family History  Problem Relation Age of Onset  . Hypertension Mother   . CVA Mother        TIA  . Cystic fibrosis Mother   . Stomach cancer Father   . Diabetes Father   . Hypertension Father   . Hypertension Brother   . Hypertension Brother   . Healthy Daughter   . Healthy Daughter    Past Surgical History:  Procedure Laterality Date  . CARPAL TUNNEL RELEASE Bilateral 2007  . CESAREAN SECTION  x2  last one 1999  . GLAUCOMA SURGERY Bilateral 2007   laser  . HYSTEROSCOPY WITH D & C  03-02-2010  . MASS EXCISION Left 05/21/2013   Procedure: EXCISION MASS LEFT LONG FINGER;  Surgeon: Linna Hoff, MD;  Location: [facility];  Service: Orthopedics;  Laterality: Left;  . SHOULDER ARTHROSCOPY WITH OPEN ROTATOR CUFF REPAIR Right 2012  . TENOSYNOVECTOMY Left 05/21/2013   Procedure: LEFT INDEX FINGER TENOSYNOVECTOMY;  Surgeon: Linna Hoff, MD;  Location: [facility];  Service: Orthopedics;  Laterality: Left;  . TRIGGER FINGER RELEASE  2009   left middle finger  . TYMPANOPLASTY Left 1993   Social History   Social History Narrative   Lives with Jenna Gordon 46yo, Daughter, April 21yo, Pets [place] and [place].    Immunization History  Administered Date(s) Administered  . Influenza Split 10/02/2011  . Influenza Whole 11/05/2008, 10/24/2009  . Influenza, High Dose Seasonal PF 09/23/2014  . Influenza,inj,Quad PF,6+ Mos 10/14/2012, 10/15/2016, 09/24/2017, 10/23/2018  . Influenza-Unspecified 10/02/2011, 10/14/2012, 11/23/2015, 10/15/2016, 09/24/2017  . PPD Test 02/11/2013  . Pneumococcal Conjugate-13 07/27/2016  . Td 08/06/2002  . Tdap 02/06/2019     Objective: Vital Signs: BP 103/65 (BP Location: Left Arm, Patient Position: Sitting, Cuff Size: Normal)   Pulse 74   Resp 14   Ht 5\' 3"  (1.6 m)   Wt 150 lb (68 kg)   LMP 05/05/2014 (Approximate)   BMI 26.57 kg/m    Physical Exam Vitals and nursing note reviewed.  Constitutional:      Appearance: She  is well-developed.  HENT:     Head: Normocephalic and atraumatic.  Eyes:     Conjunctiva/sclera: Conjunctivae normal.  Cardiovascular:     Rate and Rhythm: Normal rate and regular rhythm.     Heart sounds: Normal heart sounds.  Pulmonary:     Effort: Pulmonary effort is normal.     Breath sounds: Normal breath sounds.  Abdominal:     General: Bowel sounds are normal.     Palpations: Abdomen is soft.  Musculoskeletal:     Cervical back: Normal range of motion.  Lymphadenopathy:     Cervical: No cervical adenopathy.  Skin:    General: Skin is warm and dry.     Capillary Refill: Capillary refill takes less than 2 seconds.  Neurological:     Mental Status: She is alert and oriented to person, place, and time.  Psychiatric:        Behavior: Behavior normal.      Musculoskeletal Exam: C-spine has full range of motion.  Shoulder joint synovitis and range of motion.  She has some synovial thickening of the right wrist joint.  Synovial thickening over the MCP joints and PIP joints was noted.  No synovitis was noted.  Hip joints and knee joints in good range of motion.  She has thickening over MCPs and PIPs with no synovitis.  CDAI Exam: CDAI Score: 0.4  Patient Global: 2 mm; Provider Global: 2 mm Swollen: 0 ; Tender: 0  Joint Exam 08/04/2019   No joint exam has been documented for this visit   There is currently no information documented on the homunculus. Go to the Rheumatology activity and complete the homunculus joint exam.  Investigation: No additional findings.  Imaging: No results found.  Recent Labs: Lab Results  Component Value Date   WBC 4.9 06/29/2019   HGB 13.8 06/29/2019   PLT 184 06/29/2019   NA 139 07/21/2019   K 4.1 07/21/2019   CL 106 07/21/2019   CO2 26 07/21/2019   GLUCOSE 98 07/21/2019   BUN 12 07/21/2019   CREATININE 0.74 07/21/2019   BILITOT 0.5 06/29/2019   ALKPHOS 73 04/11/2012   AST 24 06/29/2019   ALT 22 06/29/2019   PROT 6.9 06/29/2019     ALBUMIN 4.1 04/11/2012   CALCIUM 9.3 07/21/2019   GFRAA 106 07/21/2019   QFTBGOLDPLUS NEGATIVE 04/06/2019    Speciality Comments: No specialty comments available.  Procedures:  No procedures performed Allergies: Cefzil [cefprozil], Macrobid [nitrofurantoin], and Sulfa antibiotics   Assessment / Plan:     Visit Diagnoses: Rheumatoid arthritis involving multiple sites with positive rheumatoid factor ([place]) - +RF,+anti-CCP, +14-3-3n, Diagnosed in 2006.  She has a lot of deformities from rheumatoid arthritis but no active synovitis.  She states she has been experiencing increased discomfort since we reduced her methotrexate from 6 tablets p.o. weekly to 5 tablets p.o. weekly.  She also stopped Motrin due to increase in creatinine.  Her recent labs are normal.  We discussed going back to methotrexate 6 tablets p.o. weekly.  Have sent a new prescription.  Rheumatoid nodulosis (HCC)-improved  High risk medication use - Xeljanz 11 mg XR daily and methotrexate 6 tablets p.o. weekly, folic acid 1 mg p.o. daily combination since 2018.  Her most recent labs are normal.  I have advised her to get labs in a month and then every 3 months to monitor for drug toxicity  Other fatigue-improved  Chondromalacia of both patellae-she continues to have some bilateral knee joint discomfort.  Sicca syndrome (HCC)-over-the-counter products were discussed.  Chronic laryngitis  Raynaud's phenomenon without gangrene-currently not active.  COVID-19 virus infection  Osteopenia of multiple sites  Gastroesophageal reflux disease without esophagitis  Positive TB test  Mild episode of recurrent major depressive disorder (HCC)  Mixed hyperlipidemia-dietary modifications were discussed.  Orders: No orders of the defined types were placed in this encounter.  Meds ordered this encounter  Medications  . Tofacitinib Citrate ER (XELJANZ XR) 11 MG TB24    Sig: Take 1 tablet by mouth daily.    Dispense:  30  tablet    Refill:  2  . DISCONTD: methotrexate (RHEUMATREX) 2.5 MG tablet    Sig: TAKE 6 TABLETS BY MOUTH ONCE WEEKLY. Caution:Chemotherapy. Protect from light    Dispense:  72 tablet    Refill:  0  . methotrexate (RHEUMATREX) 2.5 MG tablet    Sig: TAKE 6 TABLETS BY MOUTH ONCE WEEKLY. Caution:Chemotherapy. Protect from light    Dispense:  72 tablet    Refill:  0    .  Follow-Up Instructions: Return in about 5 months (around 01/04/2020) for Rheumatoid arthritis.   Bo Merino, MD  Note - This record has been created  using Editor, commissioning.  Chart creation errors have been sought, but may not always  have been located. Such creation errors do not reflect on  the standard of medical care.

## 2019-07-29 ENCOUNTER — Ambulatory Visit: Payer: 59 | Admitting: Rheumatology

## 2019-08-04 ENCOUNTER — Encounter: Payer: Self-pay | Admitting: Physician Assistant

## 2019-08-04 ENCOUNTER — Ambulatory Visit: Payer: 59 | Admitting: Rheumatology

## 2019-08-04 ENCOUNTER — Other Ambulatory Visit: Payer: Self-pay

## 2019-08-04 VITALS — BP 103/65 | HR 74 | Resp 14 | Ht 63.0 in | Wt 150.0 lb

## 2019-08-04 DIAGNOSIS — M8589 Other specified disorders of bone density and structure, multiple sites: Secondary | ICD-10-CM

## 2019-08-04 DIAGNOSIS — M0630 Rheumatoid nodule, unspecified site: Secondary | ICD-10-CM | POA: Diagnosis not present

## 2019-08-04 DIAGNOSIS — M2241 Chondromalacia patellae, right knee: Secondary | ICD-10-CM

## 2019-08-04 DIAGNOSIS — Z79899 Other long term (current) drug therapy: Secondary | ICD-10-CM | POA: Diagnosis not present

## 2019-08-04 DIAGNOSIS — M0579 Rheumatoid arthritis with rheumatoid factor of multiple sites without organ or systems involvement: Secondary | ICD-10-CM

## 2019-08-04 DIAGNOSIS — I7300 Raynaud's syndrome without gangrene: Secondary | ICD-10-CM

## 2019-08-04 DIAGNOSIS — M3500 Sicca syndrome, unspecified: Secondary | ICD-10-CM

## 2019-08-04 DIAGNOSIS — U071 COVID-19: Secondary | ICD-10-CM

## 2019-08-04 DIAGNOSIS — J370 Chronic laryngitis: Secondary | ICD-10-CM

## 2019-08-04 DIAGNOSIS — R5383 Other fatigue: Secondary | ICD-10-CM

## 2019-08-04 DIAGNOSIS — E782 Mixed hyperlipidemia: Secondary | ICD-10-CM

## 2019-08-04 DIAGNOSIS — F330 Major depressive disorder, recurrent, mild: Secondary | ICD-10-CM

## 2019-08-04 DIAGNOSIS — R7611 Nonspecific reaction to tuberculin skin test without active tuberculosis: Secondary | ICD-10-CM

## 2019-08-04 DIAGNOSIS — K219 Gastro-esophageal reflux disease without esophagitis: Secondary | ICD-10-CM

## 2019-08-04 DIAGNOSIS — M2242 Chondromalacia patellae, left knee: Secondary | ICD-10-CM

## 2019-08-04 MED ORDER — METHOTREXATE 2.5 MG PO TABS: ORAL_TABLET | ORAL | 0 refills | Status: DC

## 2019-08-04 MED ORDER — XELJANZ XR 11 MG PO TB24: 1.0000 | ORAL_TABLET | Freq: Every day | ORAL | 2 refills | Status: DC

## 2019-08-04 NOTE — Patient Instructions (Signed)
Standing Labs We placed an order today for your standing lab work.   Please have your standing labs drawn in August and every 3 months   If possible, please have your labs drawn 2 weeks prior to your appointment so that the provider can discuss your results at your appointment.  We have open lab daily Monday through Thursday from 8:30-12:30 PM and 1:30-4:30 PM and Friday from 8:30-12:30 PM and 1:30-4:00 PM at the office of Dr. Garrie Elenes, [facility].   Please be advised, patients with office appointments requiring lab work will take precedents over walk-in lab work.  If possible, please come for your lab work on Monday and Friday afternoons, as you may experience shorter wait times. The office is located at [address], [address], [place], [place] [address] No appointment is necessary.   Labs are drawn by [facility]. Please bring your co-pay at the time of your lab draw.  You may receive a bill from [facility] for your lab work.  If you wish to have your labs drawn at another location, please call the office 24 hours in advance to send orders.  If you have any questions regarding directions or hours of operation,  please call [phone].   As a reminder, please drink plenty of water prior to coming for your lab work. Thanks!   

## 2019-08-11 ENCOUNTER — Telehealth: Payer: Self-pay | Admitting: Rheumatology

## 2019-08-11 NOTE — Telephone Encounter (Signed)
Patient left a voicemail stating she is experiencing stiffness and pain in her left knee that began about a week ago.  Patient states it is difficult to put pressure on her knee when she gets up in the morning.  Patient requested a return call.

## 2019-08-11 NOTE — Telephone Encounter (Signed)
Please schedule an appointment for evaluation.

## 2019-08-11 NOTE — Telephone Encounter (Signed)
Patient states she is having a throbbing pain in her left knee. Patient rates it a 5-6 out of 10. Patient states the pain is worse in the evening. Patient states it gets stiff and she has trouble walking. Patient sometimes she is limping. Patient states she is on Xeljanz 11 mg daily and MTX 6 tabs weekly. Patient states she has not missed any dose of her medication. Patient states their is no warmth in her knee and some swelling in the evening. Please advise.

## 2019-08-12 NOTE — Progress Notes (Signed)
Office Visit Note  Patient: Jenna Gordon             Date of Birth: 07-Apr-1965           MRN: [id number]             PCP: Leeanne Rio, MD Referring: Leeanne Rio, MD Visit Date: 08/13/2019 Occupation: @GUAROCC @  Subjective:  Left knee joint pain   History of Present Illness: Clarice Zulauf is a 54 y.o. female with history of seropositive rheumatoid arthritis.  Patient is on Xeljanz 11 mg XR daily, methotrexate 6 tablets by mouth once weekly, and folic acid 2 mg a mouth daily.  Patient reports that at the beginning of June she had to reduce the dose of methotrexate from 6 tablets to 5 tablets weekly due to elevated creatinine.  She states that often repeat lab work she was able to go back to 6 tablets weekly.  For the past 1 week she has been experiencing severe pain in her left knee joint.  She has noticed some stiffness and difficulty walking prolonged distances due to the discomfort.  Her crepitus also seems worse.  She would like a cortisone injection today.  She states that she is also been experiencing increased pain and stiffness in both of her hands.   Activities of Daily Living:  Patient reports morning stiffness for 2 hours.   Patient Reports nocturnal pain.  Difficulty dressing/grooming: Denies Difficulty climbing stairs: Reports Difficulty getting out of chair: Reports Difficulty using hands for taps, buttons, cutlery, and/or writing: Reports  Review of Systems  Constitutional: Positive for fatigue.  HENT: Positive for mouth sores, mouth dryness and nose dryness.        Nose sores  Eyes: Positive for dryness. Negative for pain and itching.  Respiratory: Negative for shortness of breath and difficulty breathing.   Cardiovascular: Negative for chest pain and palpitations.  Gastrointestinal: Negative for blood in stool, constipation and diarrhea.  Endocrine: Negative for increased urination.  Genitourinary: Negative for difficulty urinating.    Musculoskeletal: Positive for arthralgias, joint pain, morning stiffness and muscle tenderness. Negative for joint swelling, myalgias and myalgias.  Skin: Negative for color change, rash and redness.  Allergic/Immunologic: Negative for susceptible to infections.  Neurological: Positive for memory loss and weakness. Negative for dizziness, numbness and headaches.  Hematological: Positive for bruising/bleeding tendency.  Psychiatric/Behavioral: Negative for confusion.    PMFS History:  Patient Active Problem List   Diagnosis Date Noted  . Mouth lesion 06/01/2019  . Depression, recurrent ([place]) 06/01/2019  . Corneal irritation of both eyes 03/04/2019  . Fibroid uterus 06/20/2018  . Grade II internal hemorrhoids 06/20/2018  . Mixed hyperlipidemia 03/14/2016  . Environmental allergies 03/14/2016  . Mild episode of recurrent major depressive disorder ([place]) 03/24/2015  . Gastroesophageal reflux disease without esophagitis 02/24/2015  . Osteopenia 02/24/2015  . 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 Raynaud phenomenon 04/11/2012  . ANEMIA, IRON DEFICIENCY 09/08/2009  . [place] SYNDROME 01/06/2008  . Rheumatoid arthritis of multiple sites with negative rheumatoid factor ([place]) 04/09/2007    Past Medical History:  Diagnosis Date  . Amnesia memory loss 10/14/2012  . Dry eyes   . History of glaucoma    s/p   bilateral eye laser treatment  2007  . 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 Lupus (systemic lupus erythematosus) ([place])   . Positive TB test    pt states cxr negative was tested due to treatment for rheumatoid arthritis since test +  ,  was told to take medication for tb for 9 months before going back on humira injection  . Rheumatoid arthritis(714.0)    Dr. Dondra Spry, [facility]  . Sjogren's disease ([place])   . SUI (stress urinary incontinence, female)   . Wears glasses     Family History  Problem Relation Age of Onset  . Hypertension Mother   . CVA Mother        TIA   . Cystic fibrosis Mother   . Stomach cancer Father   . Diabetes Father   . Hypertension Father   . Hypertension Brother   . Hypertension Brother   . Healthy Daughter   . Healthy Daughter    Past Surgical History:  Procedure Laterality Date  . CARPAL TUNNEL RELEASE Bilateral 2007  . CESAREAN SECTION  x2  last one 1999  . GLAUCOMA SURGERY Bilateral 2007   laser  . HYSTEROSCOPY WITH D & C  03-02-2010  . MASS EXCISION Left 05/21/2013   Procedure: EXCISION MASS LEFT LONG FINGER;  Surgeon: Linna Hoff, MD;  Location: [facility];  Service: Orthopedics;  Laterality: Left;  . SHOULDER ARTHROSCOPY WITH OPEN ROTATOR CUFF REPAIR Right 2012  . TENOSYNOVECTOMY Left 05/21/2013   Procedure: LEFT INDEX FINGER TENOSYNOVECTOMY;  Surgeon: Linna Hoff, MD;  Location: [facility];  Service: Orthopedics;  Laterality: Left;  . TRIGGER FINGER RELEASE  2009   left middle finger  . TYMPANOPLASTY Left 1993   Social History   Social History Narrative   Lives with Rise Mu 59yo, Daughter, April 21yo, Pets [employer] and [place].    Immunization History  Administered Date(s) Administered  . Influenza Split 10/02/2011  . Influenza Whole 11/05/2008, 10/24/2009  . Influenza, High Dose Seasonal PF 09/23/2014  . Influenza,inj,Quad PF,6+ Mos 10/14/2012, 10/15/2016, 09/24/2017, 10/23/2018  . Influenza-Unspecified 10/02/2011, 10/14/2012, 11/23/2015, 10/15/2016, 09/24/2017  . PPD Test 02/11/2013  . Pneumococcal Conjugate-13 07/27/2016  . Td 08/06/2002  . Tdap 02/06/2019     Objective: Vital Signs: BP 124/78 (BP Location: Left Arm, Patient Position: Sitting, Cuff Size: Normal)   Pulse 71   Resp 15   Ht 5\' 3"  (1.6 m)   Wt 151 lb 3.2 oz (68.6 kg)   LMP 05/05/2014 (Approximate)   BMI 26.78 kg/m    Physical Exam Vitals and nursing note reviewed.  Constitutional:      Appearance: She is well-developed.  HENT:     Head: Normocephalic and atraumatic.  Eyes:      Conjunctiva/sclera: Conjunctivae normal.  Pulmonary:     Effort: Pulmonary effort is normal.  Abdominal:     General: Bowel sounds are normal.     Palpations: Abdomen is soft.  Musculoskeletal:     Cervical back: Normal range of motion.  Lymphadenopathy:     Cervical: No cervical adenopathy.  Skin:    General: Skin is warm and dry.     Capillary Refill: Capillary refill takes less than 2 seconds.  Neurological:     Mental Status: She is alert and oriented to person, place, and time.  Psychiatric:        Behavior: Behavior normal.      Musculoskeletal Exam: C-spine, thoracic spine, and lumbar spine good ROM.  Shoulder joints and elbow joints good ROM. Synovial thickening of the right wrist joint. Synovial thickening over MCP and PIP joints.  Hip joints good ROM.  Left knee has painful ROM.  Left knee crepitus.  No warmth or effusion of knee joints noted.  Ankle joints good ROM with no tenderness or inflammation.    CDAI Exam: CDAI Score: 10.6  Patient Global: 3 mm; Provider Global: 3 mm Swollen: 1 ; Tender: 9  Joint Exam 08/13/2019      Right  Left  MCP 1   Tender   Tender  MCP 2   Tender   Tender  MCP 3   Tender     MCP 4   Tender     MCP 5   Tender     PIP 3   Tender     Knee     Swollen Tender     Investigation: No additional findings.  Imaging: No results found.  Recent Labs: Lab Results  Component Value Date   WBC 4.9 06/29/2019   HGB 13.8 06/29/2019   PLT 184 06/29/2019   NA 139 07/21/2019   K 4.1 07/21/2019   CL 106 07/21/2019   CO2 26 07/21/2019   GLUCOSE 98 07/21/2019   BUN 12 07/21/2019   CREATININE 0.74 07/21/2019   BILITOT 0.5 06/29/2019   ALKPHOS 73 04/11/2012   AST 24 06/29/2019   ALT 22 06/29/2019   PROT 6.9 06/29/2019   ALBUMIN 4.1 04/11/2012   CALCIUM 9.3 07/21/2019   GFRAA 106 07/21/2019   QFTBGOLDPLUS NEGATIVE 04/06/2019    Speciality Comments: No specialty comments available.  Procedures:  Large Joint Inj: L knee on 08/13/2019  3:40 PM Indications: pain Details: 27 G 1.5 in needle, medial approach  Arthrogram: No  Medications: 1.5 mL lidocaine 1 %; 40 mg triamcinolone acetonide 40 MG/ML Aspirate: 0 mL Outcome: tolerated well, no immediate complications Procedure, treatment alternatives, risks and benefits explained, specific risks discussed. Consent was given by the patient. Immediately prior to procedure a time out was called to verify the correct patient, procedure, equipment, support staff and site/side marked as required. Patient was prepped and draped in the usual sterile fashion.     Allergies: Cefzil [cefprozil], Macrobid [nitrofurantoin], and Sulfa antibiotics   Assessment / Plan:     Visit Diagnoses: Rheumatoid arthritis involving multiple sites with positive rheumatoid factor ([place]) -  +RF,+anti-CCP, +14-3-3n, Diagnosed in 2006: She presents today with left knee joint pain which started 1 week ago.  She did not have any injuries or falls.  She has painful range of motion with crepitus on exam.  No warmth of the left knee joint was noted.  X-rays from 04/06/2019 of the left knee were reviewed today in the office.  She requested a left knee joint cortisone injection.  She tolerated procedure well.  The procedure note was completed above.  She is also having increased pain and stiffness in both of her hands and has tenderness to palpation on exam.  She is currently on Xeljanz 11 mg XR daily, methotrexate 6 tablets by mouth once weekly, and folic acid 2 mg by mouth daily.  She had to reduce the dose of methotrexate to 5 tablets by mouth once weekly after her lab work on 06/29/2019 which revealed elevated creatinine and low GFR.  Her BMP was within normal limits on 07/21/2019 so she resumed methotrexate 6 tablets once weekly.  She will continue on the current treatment regimen.  She was advised to notify us of her left knee joint persist or worsens.  She will follow-up in the office in 5 months for  Rheumatoid nodulosis  [facility]): Improving.  High risk medication use - Xeljanz 11 mg XR daily, methotrexate 6 tablets p.o. weekly, folic acid 1 mg p.o.  daily combination since 2018. Reduced dose of MTX due to elevated creatinine and low GFR previously.  CBC and CMP were drawn on 06/29/2019.  Creatinine was elevated at that time but she had a repeat BMP on 07/21/2019 and creatinine and GFR were within normal limits.  TB gold was negative on 04/06/2019 and will continue to be monitored on a yearly basis.  Other fatigue: Chronic but stable.   Chondromalacia of both patellae  Sicca syndrome ([place]): She has chronic sicca symptoms.    Other medical conditions are listed as follows:   Chronic laryngitis  Raynaud's phenomenon without gangrene  COVID-19 virus infection  Osteopenia of multiple sites  Gastroesophageal reflux disease without esophagitis  Positive TB test  Mild episode of recurrent major depressive disorder ([place])  Mixed hyperlipidemia  Orders: Orders Placed This Encounter  Procedures  . Large Joint Inj: L knee   No orders of the defined types were placed in this encounter.     Follow-Up Instructions: Return in about 5 months (around 01/13/2020) for Rheumatoid arthritis.   Hazel Sams, PA-C  I examined and evaluated the patient with Hazel Sams PA.  Patient had warmth and swelling in her left knee joint on my examination.  She had tenderness since of all other joints.  After informed consent was obtained and different treatment options were discussed left knee joint was injected as described above.  She tolerated the procedure well.  Her methotrexate dose was decreased due to elevation in her creatinine.  We will monitor her labs closely.  At this point she does not want to change her medication regimen.  The plan of care was discussed as noted above.  Bo Merino, MD Note - This record has been created using Editor, commissioning.  Chart creation errors have been sought, but may not always  have  been located. Such creation errors do not reflect on  the standard of medical care.

## 2019-08-12 NOTE — Telephone Encounter (Signed)
Patient scheduled for 08/13/2019 at 2:45 pm.

## 2019-08-13 ENCOUNTER — Telehealth: Payer: Self-pay | Admitting: Rheumatology

## 2019-08-13 ENCOUNTER — Encounter: Payer: Self-pay | Admitting: Rheumatology

## 2019-08-13 ENCOUNTER — Other Ambulatory Visit: Payer: Self-pay

## 2019-08-13 ENCOUNTER — Ambulatory Visit: Payer: 59 | Admitting: Rheumatology

## 2019-08-13 VITALS — BP 124/78 | HR 71 | Resp 15 | Ht 63.0 in | Wt 151.2 lb

## 2019-08-13 DIAGNOSIS — Z79899 Other long term (current) drug therapy: Secondary | ICD-10-CM | POA: Diagnosis not present

## 2019-08-13 DIAGNOSIS — M3500 Sicca syndrome, unspecified: Secondary | ICD-10-CM

## 2019-08-13 DIAGNOSIS — G8929 Other chronic pain: Secondary | ICD-10-CM | POA: Diagnosis not present

## 2019-08-13 DIAGNOSIS — M8589 Other specified disorders of bone density and structure, multiple sites: Secondary | ICD-10-CM

## 2019-08-13 DIAGNOSIS — J370 Chronic laryngitis: Secondary | ICD-10-CM

## 2019-08-13 DIAGNOSIS — M0630 Rheumatoid nodule, unspecified site: Secondary | ICD-10-CM

## 2019-08-13 DIAGNOSIS — U071 COVID-19: Secondary | ICD-10-CM

## 2019-08-13 DIAGNOSIS — K219 Gastro-esophageal reflux disease without esophagitis: Secondary | ICD-10-CM

## 2019-08-13 DIAGNOSIS — M2241 Chondromalacia patellae, right knee: Secondary | ICD-10-CM

## 2019-08-13 DIAGNOSIS — M0579 Rheumatoid arthritis with rheumatoid factor of multiple sites without organ or systems involvement: Secondary | ICD-10-CM

## 2019-08-13 DIAGNOSIS — M25562 Pain in left knee: Secondary | ICD-10-CM

## 2019-08-13 DIAGNOSIS — R5383 Other fatigue: Secondary | ICD-10-CM | POA: Diagnosis not present

## 2019-08-13 DIAGNOSIS — E782 Mixed hyperlipidemia: Secondary | ICD-10-CM

## 2019-08-13 DIAGNOSIS — M2242 Chondromalacia patellae, left knee: Secondary | ICD-10-CM

## 2019-08-13 DIAGNOSIS — I7300 Raynaud's syndrome without gangrene: Secondary | ICD-10-CM

## 2019-08-13 DIAGNOSIS — F330 Major depressive disorder, recurrent, mild: Secondary | ICD-10-CM

## 2019-08-13 DIAGNOSIS — R7611 Nonspecific reaction to tuberculin skin test without active tuberculosis: Secondary | ICD-10-CM

## 2019-08-13 MED ORDER — LIDOCAINE HCL 1 % IJ SOLN
1.5000 mL | INTRAMUSCULAR | Status: AC | PRN
  Administered 2019-08-13: 1.5 mL

## 2019-08-13 MED ORDER — TRIAMCINOLONE ACETONIDE 40 MG/ML IJ SUSP
40.0000 mg | INTRAMUSCULAR | Status: AC | PRN
  Administered 2019-08-13: 40 mg via INTRA_ARTICULAR

## 2019-08-13 NOTE — Patient Instructions (Addendum)
Standing Labs We placed an order today for your standing lab work.   Please have your standing labs drawn in September and every 3 months   If possible, please have your labs drawn 2 weeks prior to your appointment so that the provider can discuss your results at your appointment.  We have open lab daily Monday through Thursday from 8:30-12:30 PM and 1:30-4:30 PM and Friday from 8:30-12:30 PM and 1:30-4:00 PM at the office of Dr. Bo Merino, [facility].   Please be advised, patients with office appointments requiring lab work will take precedents over walk-in lab work.  If possible, please come for your lab work on Monday and Friday afternoons, as you may experience shorter wait times. The office is located at [address] [place], [place], [place] [address] No appointment is necessary.   Labs are drawn by [facility]. Please bring your co-pay at the time of your lab draw.  You may receive a bill from [facility] for your lab work.  If you wish to have your labs drawn at another location, please call the office 24 hours in advance to send orders.  If you have any questions regarding directions or hours of operation,  please call [phone].   As a reminder, please drink plenty of water prior to coming for your lab work. Thanks!    Vaccines You are taking a medication(s) that can suppress your immune system.  The following immunizations are recommended: . Flu annually . Covid-19  o Please continue to wear your mask if you are on any medications that suppress your immune system o If you are on methotrexate, Cellcept (mycophenolate), Rinvoq, Morrie Sheldon, and Olumiant - Hold medication for 1 week after each vaccine dose o If you are on Orencia Subcutaneous injections - Hold medication both one week prior to and one week after the first vaccine dose (only) o If you are on Orencia IV infusions - Time vaccine administration so that the first vaccination will occur four weeks  after infusion . Pneumonia (Pneumovax 23 and Prevnar 13 spaced at least 1 year apart) . Shingrix (after age 53)  Please check with your PCP to make sure you are up to date.

## 2019-08-13 NOTE — Telephone Encounter (Signed)
Patient left a voicemail requesting a return call to discuss her medication.   

## 2019-08-14 NOTE — Telephone Encounter (Signed)
Patient states she was advised at her appointment on 08/13/2019, she was advised to hold medications Morrie Sheldon and MTX) 1 week after Covid vaccine. Patient states she had her Covid vaccine in February 2021. Patient is concerned about not being covered by the vaccine. Patient asked if she should get vaccinated again. Patient advised it is not recommended for her to be vaccinated again and there is no recommendation for a booster at this time. Patient advised to continue distancing herself from others, washing her hands and wearing a mask.

## 2019-09-03 ENCOUNTER — Telehealth: Payer: Self-pay | Admitting: Rheumatology

## 2019-09-03 NOTE — Telephone Encounter (Signed)
Patient calling to find out when she should stop medication in reference to getting Booster vaccine? Should she get booster? Please call to advise.

## 2019-09-03 NOTE — Telephone Encounter (Signed)
Patient advised a booster vaccination is not recommended at this time. Patient advised when it is recommended she will need to be aware of the following.   COVID-19 vaccine recommendations:   COVID-19 vaccine is recommended for everyone (unless you are allergic to a vaccine component), even if you are on a medication that suppresses your immune system.   If you are on Methotrexate, Cellcept (mycophenolate), Rinvoq, Morrie Sheldon, and Olumiant- hold the medication for 1 week after each vaccine. Hold Methotrexate for 2 weeks after the single dose COVID-19 vaccine.   Do not take Tylenol or anti-inflammatory medications (NSAIDs) 24 hours prior to the COVID-19 vaccination.   There is no direct evidence about the efficacy of the COVID-19 vaccine in individuals who are on medications that suppress the immune system.   Even if you are fully vaccinated, and you are on any medications that suppress your immune system, please continue to wear a mask, maintain at least six feet social distance and practice hand hygiene.   If you develop a COVID-19 infection, please contact your PCP or our office to determine if you need antibody infusion.  We anticipate that a booster vaccine will be available soon for immunosuppressed individuals. Please cal our office before receiving your booster dose to make adjustments to your medication regimen.   [email or web address] .

## 2019-09-08 ENCOUNTER — Other Ambulatory Visit: Payer: Self-pay

## 2019-09-08 ENCOUNTER — Encounter: Payer: Self-pay | Admitting: *Deleted

## 2019-09-08 DIAGNOSIS — Z79899 Other long term (current) drug therapy: Secondary | ICD-10-CM

## 2019-09-08 LAB — CBC WITH DIFFERENTIAL/PLATELET
Absolute Monocytes: 476 cells/uL (ref 200–950)
Basophils Absolute: 7 cells/uL (ref 0–200)
Basophils Relative: 0.1 %
Eosinophils Absolute: 41 cells/uL (ref 15–500)
Eosinophils Relative: 0.6 %
HCT: 40.6 % (ref 35.0–45.0)
Hemoglobin: 13.7 g/dL (ref 11.7–15.5)
Lymphs Abs: 1152 cells/uL (ref 850–3900)
MCH: 31.6 pg (ref 27.0–33.0)
MCHC: 33.7 g/dL (ref 32.0–36.0)
MCV: 93.8 fL (ref 80.0–100.0)
MPV: 10.6 fL (ref 7.5–12.5)
Monocytes Relative: 6.9 %
Neutro Abs: 5223 cells/uL (ref 1500–7800)
Neutrophils Relative %: 75.7 %
Platelets: 171 10*3/uL (ref 140–400)
RBC: 4.33 10*6/uL (ref 3.80–5.10)
RDW: 14 % (ref 11.0–15.0)
Total Lymphocyte: 16.7 %
WBC: 6.9 10*3/uL (ref 3.8–10.8)

## 2019-09-08 LAB — COMPLETE METABOLIC PANEL WITH GFR
AG Ratio: 1.9 (calc) (ref 1.0–2.5)
ALT: 20 U/L (ref 6–29)
AST: 20 U/L (ref 10–35)
Albumin: 4.4 g/dL (ref 3.6–5.1)
Alkaline phosphatase (APISO): 66 U/L (ref 37–153)
BUN: 17 mg/dL (ref 7–25)
CO2: 26 mmol/L (ref 20–32)
Calcium: 9.2 mg/dL (ref 8.6–10.4)
Chloride: 106 mmol/L (ref 98–110)
Creat: 0.82 mg/dL (ref 0.50–1.05)
GFR, Est African American: 93 mL/min/{1.73_m2} (ref >=60)
GFR, Est Non African American: 81 mL/min/{1.73_m2} (ref >=60)
Globulin: 2.3 g/dL (calc) (ref 1.9–3.7)
Glucose, Bld: 90 mg/dL (ref 65–99)
Potassium: 4.4 mmol/L (ref 3.5–5.3)
Sodium: 139 mmol/L (ref 135–146)
Total Bilirubin: 0.6 mg/dL (ref 0.2–1.2)
Total Protein: 6.7 g/dL (ref 6.1–8.1)

## 2019-10-21 ENCOUNTER — Other Ambulatory Visit: Payer: Self-pay | Admitting: Rheumatology

## 2019-10-21 NOTE — Telephone Encounter (Signed)
Last Visit: 08/13/2019 Next Visit: 01/05/2020 Labs: 09/08/2019 CBC and CMP WNL.  Current Dose per office note 08/13/2019: methotrexate 6 tablets p.o. weekly DX: Rheumatoid arthritis involving multiple sites with positive rheumatoid factor   Okay to refill per Dr. Estanislado Pandy

## 2019-12-03 ENCOUNTER — Other Ambulatory Visit: Payer: Self-pay

## 2019-12-03 ENCOUNTER — Other Ambulatory Visit (HOSPITAL_COMMUNITY)
Admission: RE | Admit: 2019-12-03 | Discharge: 2019-12-03 | Disposition: A | Discharge status: Home / Self Care | Payer: 59 | Source: Ambulatory Visit | Attending: Family Medicine | Admitting: Family Medicine

## 2019-12-03 ENCOUNTER — Ambulatory Visit: Payer: 59 | Admitting: Family Medicine

## 2019-12-03 VITALS — BP 120/65 | HR 63 | Ht 63.0 in | Wt 145.8 lb

## 2019-12-03 DIAGNOSIS — Z124 Encounter for screening for malignant neoplasm of cervix: Secondary | ICD-10-CM | POA: Diagnosis not present

## 2019-12-03 DIAGNOSIS — K219 Gastro-esophageal reflux disease without esophagitis: Secondary | ICD-10-CM

## 2019-12-03 DIAGNOSIS — J342 Deviated nasal septum: Secondary | ICD-10-CM | POA: Diagnosis not present

## 2019-12-03 DIAGNOSIS — F339 Major depressive disorder, recurrent, unspecified: Secondary | ICD-10-CM | POA: Diagnosis not present

## 2019-12-03 DIAGNOSIS — M0609 Rheumatoid arthritis without rheumatoid factor, multiple sites: Secondary | ICD-10-CM

## 2019-12-03 DIAGNOSIS — Z0000 Encounter for general adult medical examination without abnormal findings: Secondary | ICD-10-CM | POA: Insufficient documentation

## 2019-12-03 DIAGNOSIS — Z1231 Encounter for screening mammogram for malignant neoplasm of breast: Secondary | ICD-10-CM

## 2019-12-03 MED ORDER — SERTRALINE HCL 100 MG PO TABS
100.0000 mg | ORAL_TABLET | Freq: Every day | ORAL | 0 refills | Status: DC
Start: 2019-12-03 — End: 2020-02-10

## 2019-12-03 MED ORDER — PANTOPRAZOLE SODIUM 40 MG PO TBEC
40.0000 mg | DELAYED_RELEASE_TABLET | Freq: Every day | ORAL | 1 refills | Status: DC
Start: 2019-12-03 — End: 2020-04-12

## 2019-12-03 MED ORDER — FAMOTIDINE 20 MG PO TABS: 20.0000 mg | ORAL_TABLET | Freq: Two times a day (BID) | ORAL | 1 refills | Status: DC

## 2019-12-03 NOTE — Progress Notes (Signed)
Date of Visit: 12/03/2019   SUBJECTIVE:   HPI:  Jenna Gordon presents today for routine follow up. Recently was reassigned to me as PCP after her prior PCP graduated.  Pap - Desires pap today. No pelvic pain or vaginal discharge. No concerns for STDs and declines testing today. Does have history of abnormal pap requiring conization about 30 years ago, reports cervix stenosed/scarred after that requiring her to have a cesarean section.  Depression - currently taking sertraline 50mg  daily. Tolerating well. Has noted improvement after being on this, but feels there is still room to improve. Denies SI/HI. Has some decreased desire to do things.  Rheumatoid arthritis - followed by Dr. Estanislado Pandy. Taking metotrexate, folic acid, Morrie Sheldon. Has received all COVID vaccines including booster/third dose. Recently has noted that she seems to have more inflammation after eating foods containing gluten and is curious about how to tell if she is allergic to gluten.  Deviated septum - was diagnosed with deviated septum about 5 years ago by an ENT doctor. Did not have any procedures on it but now thinks it is beginning to bother her; wakes her up at night if cannot breathe well through her nose. Desires referral back to ENT; no preference on which office she sees.  Breast concern - had COVID back in the beginning of this year. Afterwards, developed tingling in her breasts bilaterally that went away after about a month. Her sister also had COVID and developed similar symptoms in her breasts, and underwent a mammogram which demonstrated apparent large cysts and is now requiring surgery. Patient desires clinical breast exam as well as mammogram.  GERD - taking protonix 40mg  daily. Works well for her. Has family history of stomach cancer in her father (diagnosed at age 81). Patient had EGD in 2019, reports her GI doctor advised having EGDs every 3 years given her family history. Has previously been treated for H pylori in  the past.   OBJECTIVE:   BP 120/65   Pulse 63   Ht 5\' 3"  (1.6 m)   Wt 145 lb 12.8 oz (66.1 kg)   LMP 05/05/2014 (Approximate)   SpO2 99%   BMI 25.83 kg/m  Gen: no acute distress, pleasant, cooperative, well appearing HEENT: normocephalic, atraumatic. No obvious septal defects, does appear deviated to one side. No drainage from nares Heart: regular rate and rhythm, no murmur Lungs: clear to auscultation bilaterally, normal work of breathing  Abdomen: soft, nontender to palpation  Neuro: alert, speech normal, grossly nonfocal Breasts: bilateral breasts normal in appearance. No erythema, deformity, or nipple discharge. No palpable abnormal masses. No axillary lymphadenopathy.  GU: normal appearing external genitalia without lesions. Vagina is moist without abnormal discharge. Cervical os is very small/pinpoint and scar tissue visible around os. No cervical motion tenderness or tenderness on bimanual exam. No adnexal masses.  Ext: No appreciable lower extremity edema bilaterally   ASSESSMENT/PLAN:   Health maintenance:  -pap smear done today - cytology and HPV cotesting -mammogram ordered. normal clinical breast exam (done due to patient's concerns). -current on recommended vaccines  Depression, recurrent (HCC) Improved on sertraline 50mg  but still room to improve further. Increase to 100mg  daily. Follow up in 1 month to see how she is doing.  Gastroesophageal reflux disease without esophagitis Well controlled, discussed potential risks of long term PPI use. She will do a trial of pepcid to see if she can come off the protonix. If symptoms recur with pepcid, ok to resume protonix. Refilled both of these for her.  Rheumatoid  arthritis of multiple sites with negative rheumatoid factor ([place]) Continues to follow with rheumatology Advised it is reasonable to do trial of gluten free diet since she has noted more inflammation in her joints after eating gluten  Deviated septum - refer to  ENT  FOLLOW UP: Follow up in 1 mo for depression Referring to ENT  [place] J. Ardelia Mems, [place]

## 2019-12-03 NOTE — Patient Instructions (Signed)
It was great to see you again today!  Mammogram ordered - see handout on how to schedule.  Try pepcid instead of pantoprazole. You can take the pepcid twice daily. It's ok if you need to go back to pantoprazole. I sent both in for you.  Increase sertraline to 100mg  daily. Follow up in 1 month to see how you're doing.  Reasonable to do trial of gluten free diet to see if it helps with inflammation.  Referring to ENT for your deviated septum.  Pap smear will come back in a few days on mychart.  Be well, Dr. Ardelia Mems

## 2019-12-04 NOTE — Assessment & Plan Note (Signed)
Improved on sertraline 50mg  but still room to improve further. Increase to 100mg  daily. Follow up in 1 month to see how she is doing.

## 2019-12-04 NOTE — Assessment & Plan Note (Addendum)
Continues to follow with rheumatology Advised it is reasonable to do trial of gluten free diet since she has noted more inflammation in her joints after eating gluten

## 2019-12-04 NOTE — Assessment & Plan Note (Signed)
Well controlled, discussed potential risks of long term PPI use. She will do a trial of pepcid to see if she can come off the protonix. If symptoms recur with pepcid, ok to resume protonix. Refilled both of these for her.

## 2019-12-07 LAB — CYTOLOGY - PAP
Comment: NEGATIVE
Diagnosis: NEGATIVE
High risk HPV: POSITIVE — AB

## 2019-12-10 ENCOUNTER — Encounter: Payer: Self-pay | Admitting: Family Medicine

## 2019-12-10 ENCOUNTER — Telehealth: Payer: Self-pay | Admitting: Family Medicine

## 2019-12-10 DIAGNOSIS — B977 Papillomavirus as the cause of diseases classified elsewhere: Secondary | ICD-10-CM | POA: Insufficient documentation

## 2019-12-10 NOTE — Telephone Encounter (Signed)
Called patient regarding pap smear results, which showed NILM but positive HPV. The HPV finding is not entirely unexpected given her history of prior conization of the cervix ~30 years ago. She reports normal paps for at least the last 10+ years. I am not certain whether she has been recently tested for HPV (she does not think she has, and most recent pap smear I can see is from Nov 2015 which was NILM without any HPV testing performed). Based on current guidelines, will repeat pap with HPV cotesting in one year (November 2022). If HPV persists at that time, will need colposcopy. Patient in agreement and is appreciative.  Leeanne Rio, MD

## 2019-12-15 ENCOUNTER — Other Ambulatory Visit: Payer: Self-pay | Admitting: Rheumatology

## 2019-12-15 ENCOUNTER — Other Ambulatory Visit: Payer: Self-pay

## 2019-12-15 DIAGNOSIS — Z79899 Other long term (current) drug therapy: Secondary | ICD-10-CM

## 2019-12-15 LAB — CBC WITH DIFFERENTIAL/PLATELET
Absolute Monocytes: 444 cells/uL (ref 200–950)
Basophils Absolute: 18 cells/uL (ref 0–200)
Basophils Relative: 0.3 %
Eosinophils Absolute: 150 cells/uL (ref 15–500)
Eosinophils Relative: 2.5 %
HCT: 42.5 % (ref 35.0–45.0)
Hemoglobin: 14.3 g/dL (ref 11.7–15.5)
Lymphs Abs: 1548 cells/uL (ref 850–3900)
MCH: 31.4 pg (ref 27.0–33.0)
MCHC: 33.6 g/dL (ref 32.0–36.0)
MCV: 93.4 fL (ref 80.0–100.0)
MPV: 10.8 fL (ref 7.5–12.5)
Monocytes Relative: 7.4 %
Neutro Abs: 3840 cells/uL (ref 1500–7800)
Neutrophils Relative %: 64 %
Platelets: 212 10*3/uL (ref 140–400)
RBC: 4.55 10*6/uL (ref 3.80–5.10)
RDW: 13.3 % (ref 11.0–15.0)
Total Lymphocyte: 25.8 %
WBC: 6 10*3/uL (ref 3.8–10.8)

## 2019-12-15 LAB — COMPLETE METABOLIC PANEL WITH GFR
AG Ratio: 1.4 (calc) (ref 1.0–2.5)
ALT: 19 U/L (ref 6–29)
AST: 21 U/L (ref 10–35)
Albumin: 4.3 g/dL (ref 3.6–5.1)
Alkaline phosphatase (APISO): 72 U/L (ref 37–153)
BUN: 16 mg/dL (ref 7–25)
CO2: 28 mmol/L (ref 20–32)
Calcium: 9.6 mg/dL (ref 8.6–10.4)
Chloride: 104 mmol/L (ref 98–110)
Creat: 0.8 mg/dL (ref 0.50–1.05)
GFR, Est African American: 96 mL/min/{1.73_m2} (ref >=60)
GFR, Est Non African American: 83 mL/min/{1.73_m2} (ref >=60)
Globulin: 3 g/dL (calc) (ref 1.9–3.7)
Glucose, Bld: 86 mg/dL (ref 65–139)
Potassium: 4.7 mmol/L (ref 3.5–5.3)
Sodium: 138 mmol/L (ref 135–146)
Total Bilirubin: 0.5 mg/dL (ref 0.2–1.2)
Total Protein: 7.3 g/dL (ref 6.1–8.1)

## 2019-12-15 NOTE — Telephone Encounter (Signed)
Last Visit: 08/13/2019 Next Visit: 01/12/2020 Labs: 09/08/2019 CBC and CMP WNL.  Current Dose per office note 08/13/2019: Morrie Sheldon 11 mg XR daily DX: Rheumatoid arthritis involving multiple sites with positive rheumatoid factor   Patient advised she is due to update labs. Patient will come to update prior to her follow up visit.   Okay to refill 30 day supply Morrie Sheldon?

## 2019-12-29 ENCOUNTER — Encounter: Payer: Self-pay | Admitting: Family Medicine

## 2019-12-29 NOTE — Progress Notes (Signed)
Office Visit Note  Patient: Jenna Gordon             Date of Birth: 01-28-1965           MRN: [id number]             PCP: Leeanne Rio, MD Referring: Leeanne Rio, MD Visit Date: 01/12/2020 Occupation: @GUAROCC @  Subjective:  Pain in both knees   History of Present Illness: Jenna Gordon is a 54 y.o. female with history of seropositive rheumatoid arthritis.  She states she had a flare about a week ago with bilateral knee joint pain.  She states she missed her work yesterday due to the flare.  She states she gets flare once every 2 months.  She states Morrie Sheldon is better compared to Humira.  She states her symptoms are better today.  She describes pain on the scale of 0-10 about 3.  Only took Tylenol yesterday.  Activities of Daily Living:  Patient reports morning stiffness for 2 hours.   Patient Denies nocturnal pain.  Difficulty dressing/grooming: Denies Difficulty climbing stairs: Reports Difficulty getting out of chair: Reports Difficulty using hands for taps, buttons, cutlery, and/or writing: Reports  Review of Systems  Constitutional: Positive for fatigue. Negative for night sweats, weight gain and weight loss.  HENT: Positive for mouth dryness. Negative for mouth sores, trouble swallowing, trouble swallowing and nose dryness.   Eyes: Positive for redness and dryness. Negative for pain and visual disturbance.  Respiratory: Negative for cough, shortness of breath and difficulty breathing.   Cardiovascular: Negative for chest pain, palpitations, hypertension, irregular heartbeat and swelling in legs/feet.  Gastrointestinal: Positive for constipation. Negative for blood in stool and diarrhea.  Endocrine: Positive for excessive thirst. Negative for increased urination.  Genitourinary: Negative for difficulty urinating and vaginal dryness.  Musculoskeletal: Positive for arthralgias, gait problem, joint pain, joint swelling, muscle weakness and morning stiffness.  Negative for myalgias, muscle tenderness and myalgias.  Skin: Negative for color change, rash, hair loss, skin tightness, ulcers and sensitivity to sunlight.  Allergic/Immunologic: Positive for susceptible to infections.  Neurological: Positive for numbness and weakness. Negative for dizziness, memory loss and night sweats.  Hematological: Positive for bruising/bleeding tendency. Negative for swollen glands.  Psychiatric/Behavioral: Positive for sleep disturbance. Negative for depressed mood. The patient is not nervous/anxious.     PMFS History:  Patient Active Problem List   Diagnosis Date Noted  . 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 Mouth lesion 06/01/2019  . Depression, recurrent ([place]) 06/01/2019  . Corneal irritation of both eyes 03/04/2019  . Fibroid uterus 06/20/2018  . Grade II internal hemorrhoids 06/20/2018  . Mixed hyperlipidemia 03/14/2016  . Environmental allergies 03/14/2016  . Gastroesophageal reflux disease without esophagitis 02/24/2015  . Osteopenia 02/24/2015  . 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 Raynaud phenomenon 04/11/2012  . ANEMIA, IRON DEFICIENCY 09/08/2009  . Early SYNDROME 01/06/2008  . Rheumatoid arthritis of multiple sites with negative rheumatoid factor ([place]) 04/09/2007    Past Medical History:  Diagnosis Date  . Amnesia memory loss 10/14/2012  . Dry eyes   . History of glaucoma    s/p   bilateral eye laser treatment  2007  . 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 Lupus (systemic lupus erythematosus) ([place])   . Positive TB test    pt states cxr negative was tested due to treatment for rheumatoid arthritis since test +  , was told to take medication for tb for 9 months before going back on humira injection  .  Rheumatoid arthritis(714.0)    Dr. Dondra Spry, [facility]  . Sjogren's disease ([place])   . SUI (stress urinary incontinence, female)   . Wears glasses     Family History  Problem Relation Age of Onset  . Hypertension Mother    . CVA Mother        TIA  . Cystic fibrosis Mother   . Stomach cancer Father   . Diabetes Father   . Hypertension Father   . Hypertension Brother   . Hypertension Brother   . Healthy Daughter   . Healthy Daughter    Past Surgical History:  Procedure Laterality Date  . CARPAL TUNNEL RELEASE Bilateral 2007  . CERVICAL CONIZATION W/BX  1994  . CESAREAN SECTION  x2  last one 1999  . GLAUCOMA SURGERY Bilateral 2007   laser  . HYSTEROSCOPY WITH D & C  03-02-2010  . MASS EXCISION Left 05/21/2013   Procedure: EXCISION MASS LEFT LONG FINGER;  Surgeon: Linna Hoff, MD;  Location: [facility];  Service: Orthopedics;  Laterality: Left;  . SHOULDER ARTHROSCOPY WITH OPEN ROTATOR CUFF REPAIR Right 2012  . TENOSYNOVECTOMY Left 05/21/2013   Procedure: LEFT INDEX FINGER TENOSYNOVECTOMY;  Surgeon: Linna Hoff, MD;  Location: [facility];  Service: Orthopedics;  Laterality: Left;  . TRIGGER FINGER RELEASE  2009   left middle finger  . TYMPANOPLASTY Left 1993   Social History   Social History Narrative   Lives with Rise Mu 60yo, Daughter, April 21yo, Pets [employer] and [place].    Immunization History  Administered Date(s) Administered  . Influenza Split 10/02/2011  . Influenza Whole 11/05/2008, 10/24/2009  . Influenza, High Dose Seasonal PF 09/23/2014  . Influenza,inj,Quad PF,6+ Mos 10/14/2012, 10/15/2016, 09/24/2017, 10/23/2018  . Influenza-Unspecified 10/02/2011, 10/14/2012, 11/23/2015, 10/15/2016, 09/24/2017, 10/07/2019  . PFIZER SARS-COV-2 Vaccination 03/16/2019, 04/06/2019, 09/08/2019  . PPD Test 02/11/2013  . Pneumococcal Conjugate-13 07/27/2016  . Td 08/06/2002  . Tdap 02/06/2019     Objective: Vital Signs: BP 109/72 (BP Location: Left Arm, Patient Position: Sitting, Cuff Size: Normal)   Pulse 80   Resp 15   Ht 5\' 3"  (1.6 m)   Wt 151 lb (68.5 kg)   LMP 05/05/2014 (Approximate)   BMI 26.75 kg/m    Physical Exam Vitals and nursing note  reviewed.  Constitutional:      Appearance: She is well-developed and well-nourished.  HENT:     Head: Normocephalic and atraumatic.  Eyes:     Extraocular Movements: EOM normal.     Conjunctiva/sclera: Conjunctivae normal.  Cardiovascular:     Rate and Rhythm: Normal rate and regular rhythm.     Pulses: Intact distal pulses.     Heart sounds: Normal heart sounds.  Pulmonary:     Effort: Pulmonary effort is normal.     Breath sounds: Normal breath sounds.  Abdominal:     General: Bowel sounds are normal.     Palpations: Abdomen is soft.  Musculoskeletal:     Cervical back: Normal range of motion.  Lymphadenopathy:     Cervical: No cervical adenopathy.  Skin:    General: Skin is warm and dry.     Capillary Refill: Capillary refill takes less than 2 seconds.  Neurological:     Mental Status: She is alert and oriented to person, place, and time.  Psychiatric:        Mood and Affect: Mood and affect normal.        Behavior: Behavior normal.  Musculoskeletal Exam: C-spine was in good range of motion.  Shoulder joints and elbow joints with good range of motion.  She has synovial thickening over bilateral wrist joints, MCPs and PIPs with no synovitis.  She had good range of motion of hip joints and knee joints without any warmth swelling or effusion.  She had no tenderness over ankles or MTPs.  CDAI Exam: CDAI Score: 2.6  Patient Global: 3 mm; Provider Global: 3 mm Swollen: 0 ; Tender: 2  Joint Exam 01/12/2020      Right  Left  Knee   Tender   Tender     Investigation: No additional findings.  Imaging: No results found.  Recent Labs: Lab Results  Component Value Date   WBC 6.0 12/15/2019   HGB 14.3 12/15/2019   PLT 212 12/15/2019   NA 138 12/15/2019   K 4.7 12/15/2019   CL 104 12/15/2019   CO2 28 12/15/2019   GLUCOSE 86 12/15/2019   BUN 16 12/15/2019   CREATININE 0.80 12/15/2019   BILITOT 0.5 12/15/2019   ALKPHOS 73 04/11/2012   AST 21 12/15/2019   ALT  19 12/15/2019   PROT 7.3 12/15/2019   ALBUMIN 4.1 04/11/2012   CALCIUM 9.6 12/15/2019   GFRAA 96 12/15/2019   QFTBGOLDPLUS NEGATIVE 04/06/2019    Speciality Comments: No specialty comments available.  Procedures:  No procedures performed Allergies: Cefzil [cefprozil], Macrobid [nitrofurantoin], and Sulfa antibiotics   Assessment / Plan:     Visit Diagnoses: Rheumatoid arthritis involving multiple sites with positive rheumatoid factor ([place]) - +RF,+anti-CCP, +14-3-3n, Diagnosed in 2006: She has severe arthritis with multiple joint limitation and synovial thickening.  I do not see any active synovitis on examination.  Patient [place] and had [place] flare with increased pain and swelling in her knee joints.  Although the symptoms resolved by themselves.  She took Tylenol for pain management.  She had only synovial thickening on my examination today.  High risk medication use - Xeljanz 11 mg XR daily, methotrexate 6 tablets p.o. weekly, folic acid 1 mg p.o. daily combination since 2018.  Her labs are stable.  We will continue to monitor labs every 3 months.  She will need lipid panel next year.  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  Chondromalacia of both patellae-she states she was having increased knee joint pain while she was at the mountains.  I do not see any synovitis.  Sicca syndrome (HCC)-over-the-counter products have been helpful.  Raynaud's phenomenon without gangrene-she states she had an episode of Raynauds while she was at the mountains.  Osteopenia of multiple sites-she is on calcium and vitamin D.  Chronic laryngitis  COVID-19 virus infection-she is fully vaccinated against COVID-19 and had the booster.  Use of mask, social distancing and hand hygiene was discussed.  Gastroesophageal reflux disease without esophagitis  Positive TB test  Mixed hyperlipidemia  Mild episode of recurrent major depressive disorder  ([place])  Orders: No orders of the defined types were placed in this encounter.  Meds ordered this encounter  Medications  . Tofacitinib Citrate ER (XELJANZ XR) 11 MG TB24    Sig: Take 1 tablet by mouth daily.    Dispense:  90 tablet    Refill:  0  . methotrexate (RHEUMATREX) 2.5 MG tablet    Sig: TAKE 6 TABLETS BY MOUTH  ONCE WEEKLY (CAUTION:  CHEMOTHERAPY; PROTECT FROM  LIGHT)    Dispense:  72 tablet    Refill:  0  .  folic acid (FOLVITE) 1 MG tablet    Sig: Take 1 tablet (1 mg total) by mouth daily.    Dispense:  90 tablet    Refill:  3     Follow-Up Instructions: Return in about 3 months (around 04/11/2020) for Rheumatoid arthritis.   Bo Merino, MD  Note - This record has been created using Editor, commissioning.  Chart creation errors have been sought, but may not always  have been located. Such creation errors do not reflect on  the standard of medical care.

## 2020-01-05 ENCOUNTER — Ambulatory Visit: Payer: 59 | Admitting: Physician Assistant

## 2020-01-06 ENCOUNTER — Other Ambulatory Visit: Payer: Self-pay | Admitting: Rheumatology

## 2020-01-07 NOTE — Telephone Encounter (Signed)
Last Visit:08/13/2019 Next Visit:01/12/2020 Labs: 12/15/2019 CBC and CMP WNL  Current Dose per office note7/22/2021:methotrexate 6 tablets p.o. weekly ZW:CHENIDPOEU arthritis involving multiple sites with positive rheumatoid factor  Okay to refill per Dr. Estanislado Pandy

## 2020-01-12 ENCOUNTER — Ambulatory Visit: Payer: 59 | Admitting: Rheumatology

## 2020-01-12 ENCOUNTER — Other Ambulatory Visit: Payer: Self-pay

## 2020-01-12 ENCOUNTER — Encounter: Payer: Self-pay | Admitting: Rheumatology

## 2020-01-12 VITALS — BP 109/72 | HR 80 | Resp 15 | Ht 63.0 in | Wt 151.0 lb

## 2020-01-12 DIAGNOSIS — J370 Chronic laryngitis: Secondary | ICD-10-CM

## 2020-01-12 DIAGNOSIS — Z79899 Other long term (current) drug therapy: Secondary | ICD-10-CM

## 2020-01-12 DIAGNOSIS — M2241 Chondromalacia patellae, right knee: Secondary | ICD-10-CM

## 2020-01-12 DIAGNOSIS — F330 Major depressive disorder, recurrent, mild: Secondary | ICD-10-CM

## 2020-01-12 DIAGNOSIS — I7300 Raynaud's syndrome without gangrene: Secondary | ICD-10-CM

## 2020-01-12 DIAGNOSIS — U071 COVID-19: Secondary | ICD-10-CM

## 2020-01-12 DIAGNOSIS — E782 Mixed hyperlipidemia: Secondary | ICD-10-CM

## 2020-01-12 DIAGNOSIS — M0630 Rheumatoid nodule, unspecified site: Secondary | ICD-10-CM

## 2020-01-12 DIAGNOSIS — M0579 Rheumatoid arthritis with rheumatoid factor of multiple sites without organ or systems involvement: Secondary | ICD-10-CM | POA: Diagnosis not present

## 2020-01-12 DIAGNOSIS — K219 Gastro-esophageal reflux disease without esophagitis: Secondary | ICD-10-CM

## 2020-01-12 DIAGNOSIS — R5383 Other fatigue: Secondary | ICD-10-CM | POA: Diagnosis not present

## 2020-01-12 DIAGNOSIS — M2242 Chondromalacia patellae, left knee: Secondary | ICD-10-CM

## 2020-01-12 DIAGNOSIS — M8589 Other specified disorders of bone density and structure, multiple sites: Secondary | ICD-10-CM

## 2020-01-12 DIAGNOSIS — M3500 Sicca syndrome, unspecified: Secondary | ICD-10-CM

## 2020-01-12 DIAGNOSIS — R7611 Nonspecific reaction to tuberculin skin test without active tuberculosis: Secondary | ICD-10-CM

## 2020-01-12 MED ORDER — XELJANZ XR 11 MG PO TB24
1.0000 | ORAL_TABLET | Freq: Every day | ORAL | 0 refills | Status: DC
Start: 2020-01-12 — End: 2020-03-28

## 2020-01-12 MED ORDER — METHOTREXATE 2.5 MG PO TABS
ORAL_TABLET | ORAL | 0 refills | Status: DC
Start: 2020-01-12 — End: 2020-04-12

## 2020-01-12 MED ORDER — FOLIC ACID 1 MG PO TABS
1.0000 mg | ORAL_TABLET | Freq: Every day | ORAL | 3 refills | Status: DC
Start: 2020-01-12 — End: 2021-01-24

## 2020-01-12 NOTE — Patient Instructions (Signed)
Standing Labs We placed an order today for your standing lab work.   Please have your standing labs drawn in February and every 3 months  If possible, please have your labs drawn 2 weeks prior to your appointment so that the provider can discuss your results at your appointment.  We have open lab daily Monday through Thursday from 8:30-12:30 PM and 1:30-4:30 PM and Friday from 8:30-12:30 PM and 1:30-4:00 PM at the office of Dr. Bo Merino, [facility].   Please be advised, patients with office appointments requiring lab work will take precedents over walk-in lab work.  If possible, please come for your lab work on Monday and Friday afternoons, as you may experience shorter wait times. The office is located at [address], [place], [place], [place] [address] No appointment is necessary.   Labs are drawn by [facility]. Please bring your co-pay at the time of your lab draw.  You may receive a bill from [facility] for your lab work.  If you wish to have your labs drawn at another location, please call the office 24 hours in advance to send orders.  If you have any questions regarding directions or hours of operation,  please call [phone].   As a reminder, please drink plenty of water prior to coming for your lab work. Thanks!   [email or web address]

## 2020-01-13 ENCOUNTER — Encounter (INDEPENDENT_AMBULATORY_CARE_PROVIDER_SITE_OTHER): Payer: Self-pay | Admitting: Otolaryngology

## 2020-01-13 ENCOUNTER — Ambulatory Visit (INDEPENDENT_AMBULATORY_CARE_PROVIDER_SITE_OTHER): Payer: 59 | Admitting: Otolaryngology

## 2020-01-13 ENCOUNTER — Other Ambulatory Visit: Payer: Self-pay

## 2020-01-13 VITALS — Temp 96.4°F

## 2020-01-13 DIAGNOSIS — J342 Deviated nasal septum: Secondary | ICD-10-CM | POA: Diagnosis not present

## 2020-01-13 DIAGNOSIS — J3489 Other specified disorders of nose and nasal sinuses: Secondary | ICD-10-CM | POA: Diagnosis not present

## 2020-01-13 NOTE — Progress Notes (Signed)
HPI: Jenna Gordon is a 54 y.o. female who presents is referred by her PCP for evaluation of nasal congestion and septal deformity.  She has trouble breathing especially on the left side as the left side always feels clogged.  She also snores on a regular basis and sometimes wakes herself up. She has history of rheumatoid arthritis. She is referred here for evaluation of septal deformity.  Past Medical History:  Diagnosis Date  . Amnesia memory loss 10/14/2012  . Dry eyes   . History of glaucoma    s/p   bilateral eye laser treatment  2007  . 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 Lupus (systemic lupus erythematosus) (HCC)   . Positive TB test    pt states cxr negative was tested due to treatment for rheumatoid arthritis since test +  , was told to take medication for tb for 9 months before going back on humira injection  . Rheumatoid arthritis(714.0)    Dr. Ed Blalock, [facility]  . Sjogren's disease (HCC)   . SUI (stress urinary incontinence, female)   . Wears glasses    Past Surgical History:  Procedure Laterality Date  . CARPAL TUNNEL RELEASE Bilateral 2007  . CERVICAL CONIZATION W/BX  1994  . CESAREAN SECTION  x2  last one 1999  . GLAUCOMA SURGERY Bilateral 2007   laser  . HYSTEROSCOPY WITH D & C  03-02-2010  . MASS EXCISION Left 05/21/2013   Procedure: EXCISION MASS LEFT LONG FINGER;  Surgeon: Sharma Covert, MD;  Location: [facility];  Service: Orthopedics;  Laterality: Left;  . SHOULDER ARTHROSCOPY WITH OPEN ROTATOR CUFF REPAIR Right 2012  . TENOSYNOVECTOMY Left 05/21/2013   Procedure: LEFT INDEX FINGER TENOSYNOVECTOMY;  Surgeon: Sharma Covert, MD;  Location: [facility];  Service: Orthopedics;  Laterality: Left;  . TRIGGER FINGER RELEASE  2009   left middle finger  . TYMPANOPLASTY Left 1993   Social History   Socioeconomic History  . Marital status: Single    Spouse name: Not on file  . Number of children: Not on  file  . Years of education: Not on file  . Highest education level: Not on file  Occupational History  . Not on file  Tobacco Use  . Smoking status: Never Smoker  . Smokeless tobacco: Never Used  Vaping Use  . Vaping Use: Never used  Substance and Sexual Activity  . Alcohol use: Yes    Comment: wine occ  . Drug use: No  . Sexual activity: Not on file  Other Topics Concern  . Not on file  Social History Narrative   Lives with Santiago Glad 31VQ, Daughter, April 21yo, Pets [employer] and [place].    Social Determinants of Health   Financial Resource Strain: Not on file  Food Insecurity: Not on file  Transportation Needs: Not on file  Physical Activity: Not on file  Stress: Not on file  Social Connections: Not on file   Family History  Problem Relation Age of Onset  . Hypertension Mother   . CVA Mother        TIA  . Cystic fibrosis Mother   . Stomach cancer Father   . Diabetes Father   . Hypertension Father   . Hypertension Brother   . Hypertension Brother   . Healthy Daughter   . Healthy Daughter    Allergies  Allergen Reactions  . Cefzil [Cefprozil]   . Macrobid [Nitrofurantoin] Hives  . Sulfa Antibiotics Hives  Prior to Admission medications   Medication Sig Start Date End Date Taking? Authorizing Provider  famotidine (PEPCID) 20 MG tablet Take 1 tablet (20 mg total) by mouth 2 (two) times daily. 12/03/19   Leeanne Rio, MD  folic acid (FOLVITE) 1 MG tablet Take 1 tablet (1 mg total) by mouth daily. 01/12/20   Bo Merino, MD  methotrexate (RHEUMATREX) 2.5 MG tablet TAKE 6 TABLETS BY MOUTH  ONCE WEEKLY (CAUTION:  CHEMOTHERAPY; PROTECT FROM  LIGHT) 01/12/20   Bo Merino, MD  Multiple Vitamin (MULTIVITAMIN PO) Take by mouth daily.    [provider]  Omega-3 Fatty Acids (FISH OIL PO) Take by mouth daily.    [provider]  pantoprazole (PROTONIX) 40 MG tablet Take 1 tablet (40 mg total) by mouth daily. 12/03/19   Leeanne Rio, MD  sertraline (ZOLOFT) 100 MG tablet Take 1 tablet (100 mg total) by mouth daily. 12/03/19   Leeanne Rio, MD  Tofacitinib Citrate ER (XELJANZ XR) 11 MG TB24 Take 1 tablet by mouth daily. 01/12/20   Bo Merino, MD     Positive ROS: Otherwise negative  All other systems have been reviewed and were otherwise negative with the exception of those mentioned in the HPI and as above.  Physical Exam: Constitutional: Alert, well-appearing, no acute distress Ears: External ears without lesions or tenderness. Ear canals are clear bilaterally with intact, clear TMs bilaterally. Nasal: External nose without lesions. Septum with a rather severe deviation to the left with a very narrowed left nasal passageway compared to the right.  After decongesting the nose there were no polyps noted in both the middle meatus regions were clear with no signs of infection.. Oral: Lips and gums without lesions. Tongue and palate mucosa without lesions. Posterior oropharynx clear.  Small tonsils and small uvula. Neck: No palpable adenopathy or masses Respiratory: Breathing comfortably Cardiac exam: Regular rate and rhythm without murmur. Skin: No facial/neck lesions or rash noted.  Procedures  Assessment: Septal deviation to the left with left-sided nasal obstruction.  Plan: Reviewed with patient concerning limited medical treatment options for this.  I did prescribe Nasacort 2 sprays each nostril at night as this will help some with nasal congestion.  She is used Nasacort or Flonase in the past. Briefly reviewed with her concerning surgery involved in septoplasty and turbinate reductions.  She would benefit from surgery and discussed this with her. She will initially try the nasal steroid spray and call us back if she elects to undergo surgery.   Radene Journey, MD   CC:

## 2020-01-18 ENCOUNTER — Other Ambulatory Visit: Payer: Self-pay

## 2020-01-25 ENCOUNTER — Other Ambulatory Visit: Payer: Self-pay

## 2020-01-26 ENCOUNTER — Encounter: Payer: Self-pay | Admitting: Family Medicine

## 2020-01-26 ENCOUNTER — Other Ambulatory Visit: Payer: Self-pay

## 2020-01-26 ENCOUNTER — Ambulatory Visit: Payer: 59 | Admitting: Family Medicine

## 2020-01-26 DIAGNOSIS — F339 Major depressive disorder, recurrent, unspecified: Secondary | ICD-10-CM | POA: Diagnosis not present

## 2020-01-26 DIAGNOSIS — B977 Papillomavirus as the cause of diseases classified elsewhere: Secondary | ICD-10-CM

## 2020-01-26 NOTE — Assessment & Plan Note (Signed)
Well controlled. Continue sertraline 100mg  daily. Follow up in 3 months.

## 2020-01-26 NOTE — Progress Notes (Signed)
  Date of Visit: 01/26/2020   SUBJECTIVE:   HPI:  Jenna Gordon presents today for routine follow up.  Depression - taking sertraline 100mg  daily. Has noted improvement since we increased the dose at last visit. Sleeping well, and has more motivation to do things. Denies SI/HI. She is happy with this dose.  Blood pressure concerns - patient was concerned as she recently had about 2 days of pressure on the top of her head. Checked blood pressure multiple times and it was never over 140/90, but was higher than her usual. Head pressure resolved and is now feeling well.  HPV - has questions about HPV positive result and meaning  OBJECTIVE:   BP 118/70   Pulse 84   Ht 5\' 3"  (1.6 m)   Wt 150 lb 12.8 oz (68.4 kg)   LMP 05/05/2014 (Approximate)   SpO2 100%   BMI 26.71 kg/m  Gen: no acute distress, pleasant cooperative HEENT: normocephalic, atraumatic  Heart: regular rate and rhythm, no murmur Lungs: clear to auscultation bilaterally, normal work of breathing  Neuro: alert, speech normal Psych: normal range of affect, well groomed, speech normal in rate and volume, normal eye contact   ASSESSMENT/PLAN:   Health maintenance:  -has mammogram scheduled 1/20  High risk HPV infection Discussed result in detail with patient; reassured her that we will follow it closely with repeat pap in November.  Depression, recurrent (HCC) Well controlled. Continue sertraline 100mg  daily. Follow up in 3 months.   Blood pressure concerns Head pressure was transient and resolved. Blood pressure is normal today and all measured blood pressures are not high enough to warrant medications. Reasonable to periodically monitor, no intervention right now.  Discussed precautions regarding present COVID surge in detail.  FOLLOW UP: Follow up in 3 mos for depression, sooner if needed  2/20 J. December, MD [facility]  Greater than 30 minutes were spent on this encounter on the day of  service, including pre-visit planning, actual face to face time, coordination of care, and documentation of visit.

## 2020-01-26 NOTE — Assessment & Plan Note (Signed)
Discussed result in detail with patient; reassured her that we will follow it closely with repeat pap in November.

## 2020-01-26 NOTE — Patient Instructions (Signed)
It was great to see you again today!  Stay on current medications  Follow up with me in 3 months  Be well, Dr. Pollie Meyer

## 2020-02-08 ENCOUNTER — Other Ambulatory Visit: Payer: Self-pay | Admitting: Family Medicine

## 2020-02-11 ENCOUNTER — Ambulatory Visit
Admission: RE | Admit: 2020-02-11 | Discharge: 2020-02-11 | Disposition: A | Discharge status: Home / Self Care | Payer: 59 | Source: Ambulatory Visit | Attending: Family Medicine | Admitting: Family Medicine

## 2020-02-11 ENCOUNTER — Other Ambulatory Visit: Payer: Self-pay

## 2020-02-11 DIAGNOSIS — Z1231 Encounter for screening mammogram for malignant neoplasm of breast: Secondary | ICD-10-CM

## 2020-02-23 ENCOUNTER — Other Ambulatory Visit: Payer: Self-pay | Admitting: Family Medicine

## 2020-03-22 ENCOUNTER — Other Ambulatory Visit: Payer: Self-pay

## 2020-03-22 DIAGNOSIS — Z79899 Other long term (current) drug therapy: Secondary | ICD-10-CM

## 2020-03-23 LAB — CBC WITH DIFFERENTIAL/PLATELET
Absolute Monocytes: 389 cells/uL (ref 200–950)
Basophils Absolute: 10 cells/uL (ref 0–200)
Basophils Relative: 0.2 %
Eosinophils Absolute: 211 cells/uL (ref 15–500)
Eosinophils Relative: 4.4 %
HCT: 41.5 % (ref 35.0–45.0)
Hemoglobin: 14.1 g/dL (ref 11.7–15.5)
Lymphs Abs: 758 cells/uL — ABNORMAL LOW (ref 850–3900)
MCH: 31.3 pg (ref 27.0–33.0)
MCHC: 34 g/dL (ref 32.0–36.0)
MCV: 92 fL (ref 80.0–100.0)
MPV: 10.6 fL (ref 7.5–12.5)
Monocytes Relative: 8.1 %
Neutro Abs: 3432 cells/uL (ref 1500–7800)
Neutrophils Relative %: 71.5 %
Platelets: 205 10*3/uL (ref 140–400)
RBC: 4.51 10*6/uL (ref 3.80–5.10)
RDW: 13.2 % (ref 11.0–15.0)
Total Lymphocyte: 15.8 %
WBC: 4.8 10*3/uL (ref 3.8–10.8)

## 2020-03-23 LAB — COMPLETE METABOLIC PANEL WITH GFR
AG Ratio: 1.6 (calc) (ref 1.0–2.5)
ALT: 21 U/L (ref 6–29)
AST: 21 U/L (ref 10–35)
Albumin: 4.3 g/dL (ref 3.6–5.1)
Alkaline phosphatase (APISO): 76 U/L (ref 37–153)
BUN: 15 mg/dL (ref 7–25)
CO2: 28 mmol/L (ref 20–32)
Calcium: 9.4 mg/dL (ref 8.6–10.4)
Chloride: 104 mmol/L (ref 98–110)
Creat: 0.66 mg/dL (ref 0.50–1.05)
GFR, Est African American: 115 mL/min/{1.73_m2} (ref >=60)
GFR, Est Non African American: 99 mL/min/{1.73_m2} (ref >=60)
Globulin: 2.7 g/dL (calc) (ref 1.9–3.7)
Glucose, Bld: 86 mg/dL (ref 65–99)
Potassium: 4.1 mmol/L (ref 3.5–5.3)
Sodium: 139 mmol/L (ref 135–146)
Total Bilirubin: 0.4 mg/dL (ref 0.2–1.2)
Total Protein: 7 g/dL (ref 6.1–8.1)

## 2020-03-23 NOTE — Progress Notes (Signed)
Lymphocyte count is low due to the use of methotrexate and Xeljanz.  We will continue to monitor closely.  CMP is normal.

## 2020-03-26 ENCOUNTER — Other Ambulatory Visit: Payer: Self-pay | Admitting: Rheumatology

## 2020-03-28 ENCOUNTER — Other Ambulatory Visit: Payer: Self-pay | Admitting: *Deleted

## 2020-03-28 DIAGNOSIS — Z79899 Other long term (current) drug therapy: Secondary | ICD-10-CM

## 2020-03-28 NOTE — Telephone Encounter (Signed)
Last Visit: 01/12/2020 Next Visit: 04/12/2020 Labs: 03/22/2020, Lymphocyte count is low due to the use of methotrexate and Xeljanz. We will continue to monitor closely. CMP is normal. TB Gold: 04/06/2019, negative, TB gold pended for 04/12/2020 visit  Current Dose per office note 01/12/2020, Jenna Gordon 11 mg XR daily  YB:OFBPZWCHEN arthritis involving multiple sites with positive rheumatoid factor   Last Fill: 01/12/2020  Okay to refill Jenna Gordon?

## 2020-03-29 NOTE — Progress Notes (Signed)
Office Visit Note  Patient: Jenna Gordon             Date of Birth: 1965-10-11           MRN: [id number]             PCP: Leeanne Rio, MD Referring: Leeanne Rio, MD Visit Date: 04/12/2020 Occupation: @GUAROCC @  Subjective:  Medication management   History of Present Illness: Jenna Gordon is a 55 y.o. female with history of seropositive rheumatoid arthritis and osteoarthritis.  She states she is doing quite well on the combination of Xeljanz and methotrexate.  She has tried tapering methotrexate in the past but had a flare of her symptoms.  She has some discomfort in her right knee joint.  She also complains of some right rib cage discomfort.  She states the pain is started about 4 days ago.  The symptoms were sharp pain and the symptoms are subsided a little bit.  She states she had a mammogram recently which was negative.  Activities of Daily Living:  Patient reports morning stiffness for 1.5-2  hours.   Patient Reports nocturnal pain.  Difficulty dressing/grooming: Denies Difficulty climbing stairs: Denies Difficulty getting out of chair: Reports Difficulty using hands for taps, buttons, cutlery, and/or writing: Reports  Review of Systems  Constitutional: Positive for fatigue.  HENT: Positive for mouth sores, mouth dryness and nose dryness.   Eyes: Positive for dryness. Negative for pain and itching.  Respiratory: Negative for shortness of breath and difficulty breathing.   Cardiovascular: Negative for chest pain and palpitations.  Gastrointestinal: Negative for blood in stool, constipation and diarrhea.  Endocrine: Negative for increased urination.  Genitourinary: Negative for difficulty urinating.  Musculoskeletal: Positive for arthralgias, joint pain, joint swelling and morning stiffness. Negative for myalgias, muscle tenderness and myalgias.  Skin: Positive for rash. Negative for color change and redness.  Allergic/Immunologic: Positive for susceptible  to infections.  Neurological: Positive for numbness and memory loss. Negative for dizziness, headaches and weakness.  Hematological: Positive for bruising/bleeding tendency.  Psychiatric/Behavioral: Negative for confusion.    PMFS History:  Patient Active Problem List   Diagnosis Date Noted  . 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 Mouth lesion 06/01/2019  . Depression, recurrent ([place]) 06/01/2019  . Corneal irritation of both eyes 03/04/2019  . Fibroid uterus 06/20/2018  . Grade II internal hemorrhoids 06/20/2018  . Mixed hyperlipidemia 03/14/2016  . Environmental allergies 03/14/2016  . Gastroesophageal reflux disease without esophagitis 02/24/2015  . Osteopenia 02/24/2015  . 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 Raynaud phenomenon 04/11/2012  . ANEMIA, IRON DEFICIENCY 09/08/2009  . [place] SYNDROME 01/06/2008  . Rheumatoid arthritis of multiple sites with negative rheumatoid factor ([place]) 04/09/2007    Past Medical History:  Diagnosis Date  . Amnesia memory loss 10/14/2012  . Dry eyes   . History of glaucoma    s/p   bilateral eye laser treatment  2007  . 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 Lupus (systemic lupus erythematosus) ([place])   . Positive TB test    pt states cxr negative was tested due to treatment for rheumatoid arthritis since test +  , was told to take medication for tb for 9 months before going back on humira injection  . Rheumatoid arthritis(714.0)    Dr. Dondra Spry, [facility]  . Sjogren's disease ([place])   . SUI (stress urinary incontinence, female)   . Wears glasses     Family History  Problem Relation Age of Onset  .  Hypertension Mother   . CVA Mother        TIA  . Cystic fibrosis Mother   . Stomach cancer Father   . Diabetes Father   . Hypertension Father   . Hypertension Brother   . Hypertension Brother   . Healthy Daughter   . Healthy Daughter    Past Surgical History:  Procedure Laterality Date  . CARPAL TUNNEL RELEASE  Bilateral 2007  . CERVICAL CONIZATION W/BX  1994  . CESAREAN SECTION  x2  last one 1999  . GLAUCOMA SURGERY Bilateral 2007   laser  . HYSTEROSCOPY WITH D & C  03-02-2010  . MASS EXCISION Left 05/21/2013   Procedure: EXCISION MASS LEFT LONG FINGER;  Surgeon: Linna Hoff, MD;  Location: [facility];  Service: Orthopedics;  Laterality: Left;  . SHOULDER ARTHROSCOPY WITH OPEN ROTATOR CUFF REPAIR Right 2012  . TENOSYNOVECTOMY Left 05/21/2013   Procedure: LEFT INDEX FINGER TENOSYNOVECTOMY;  Surgeon: Linna Hoff, MD;  Location: [facility];  Service: Orthopedics;  Laterality: Left;  . TRIGGER FINGER RELEASE  2009   left middle finger  . TYMPANOPLASTY Left 1993   Social History   Social History Narrative   Lives with Rise Mu 109yo, Daughter, April 21yo, Pets Scout and [employer]ner.    Immunization History  Administered Date(s) Administered  . Influenza Split 10/02/2011  . Influenza Whole 11/05/2008, 10/24/2009  . Influenza, High Dose Seasonal PF 09/23/2014  . Influenza,inj,Quad PF,6+ Mos 10/14/2012, 10/15/2016, 09/24/2017, 10/23/2018  . Influenza-Unspecified 10/02/2011, 10/14/2012, 11/23/2015, 10/15/2016, 09/24/2017, 10/07/2019  . PFIZER(Purple Top)SARS-COV-2 Vaccination 03/16/2019, 04/06/2019, 09/08/2019, 03/10/2020  . PPD Test 02/11/2013  . Pneumococcal Conjugate-13 07/27/2016  . Td 08/06/2002  . Tdap 02/06/2019     Objective: Vital Signs: BP 128/83 (BP Location: Right Arm, Patient Position: Sitting, Cuff Size: Normal)   Pulse 69   Ht 5\' 3"  (1.6 m)   Wt 154 lb (69.9 kg)   LMP 05/05/2014 (Approximate)   BMI 27.28 kg/m    Physical Exam Vitals and nursing note reviewed.  Constitutional:      Appearance: She is well-developed.  HENT:     Head: Normocephalic and atraumatic.  Eyes:     Conjunctiva/sclera: Conjunctivae normal.  Cardiovascular:     Rate and Rhythm: Normal rate and regular rhythm.     Heart sounds: Normal heart sounds.  Pulmonary:      Effort: Pulmonary effort is normal.     Breath sounds: Normal breath sounds.  Abdominal:     General: Bowel sounds are normal.     Palpations: Abdomen is soft.  Musculoskeletal:     Cervical back: Normal range of motion.  Lymphadenopathy:     Cervical: No cervical adenopathy.  Skin:    General: Skin is warm and dry.     Capillary Refill: Capillary refill takes less than 2 seconds.  Neurological:     Mental Status: She is alert and oriented to person, place, and time.  Psychiatric:        Behavior: Behavior normal.      Musculoskeletal Exam: C-spine was in good range of motion.  Shoulder joints, elbow joints are in good range of motion.  She had limited range of motion of her wrist joints and MCPs.  She has PIP and DIP thickening.  MCP synovial thickening was noted but no synovitis was noted.  Hip joints and knee joints with good range of motion.  She has some discomfort range of motion of her right knee  joint without any warmth or swelling.  There is no tenderness over ankles or MTPs.  She had some tenderness on palpation over right seventh rib.  CDAI Exam: CDAI Score: 0.8  Patient Global: 4 mm; Provider Global: 4 mm Swollen: 0 ; Tender: 0  Joint Exam 04/12/2020   No joint exam has been documented for this visit   There is currently no information documented on the homunculus. Go to the Rheumatology activity and complete the homunculus joint exam.  Investigation: No additional findings.  Imaging: No results found.  Recent Labs: Lab Results  Component Value Date   WBC 4.8 03/22/2020   HGB 14.1 03/22/2020   PLT 205 03/22/2020   NA 139 03/22/2020   K 4.1 03/22/2020   CL 104 03/22/2020   CO2 28 03/22/2020   GLUCOSE 86 03/22/2020   BUN 15 03/22/2020   CREATININE 0.66 03/22/2020   BILITOT 0.4 03/22/2020   ALKPHOS 73 04/11/2012   AST 21 03/22/2020   ALT 21 03/22/2020   PROT 7.0 03/22/2020   ALBUMIN 4.1 04/11/2012   CALCIUM 9.4 03/22/2020   GFRAA 115 03/22/2020    QFTBGOLDPLUS NEGATIVE 04/06/2019    Speciality Comments: No specialty comments available.  Procedures:  No procedures performed Allergies: Cefzil [cefprozil], Macrobid [nitrofurantoin], and Sulfa antibiotics   Assessment / Plan:     Visit Diagnoses: Rheumatoid arthritis involving multiple sites with positive rheumatoid factor ([place]) - +RF,+anti-CCP, +14-3-3n, Diagnosed in 2006: She is doing much better on the combination of Xeljanz and methotrexate.  She had no synovitis on my examination.  Although she does have synovial thickening in multiple joints.  High risk medication use - Xeljanz 11 mg XR daily, methotrexate 6 tablets p.o. weekly, folic acid 1 mg p.o. daily combination since 2018.  -Labs from March 22, 2020 showed normal CBC and CMP with GFR.  We will continue to monitor labs every 3 months.  Plan: QuantiFERON-TB Gold Plus today and then every year.  Side effects of Xeljanz and methotrexate were reviewed.  Black box warning regarding MACE and increased risk of lymphoma and cancer was discussed.  She was advised to stop the medications in case she develops an infection.  She may resume medications once infection resolves.  She has been fully vaccinated against COVID-19 and also received a booster.  Rheumatoid nodulosis (HCC)  Rib pain on right side -she has been experiencing right sided rib cage discomfort.  Her chest lungs were clear to auscultation.  She has no discomfort on deep breathing.  The pain was reproducible.  I will obtain chest x-ray today.  If her symptoms persist I advised her to follow-up with her PCP.  Plan: DG Chest 2 View  Chondromalacia of both patellae-she continues to have some discomfort in her knee joints.  Sicca syndrome (HCC)-over-the-counter products were discussed.  Raynaud's phenomenon without gangrene-her symptoms have improved as the weather has warmed up.  Osteopenia of multiple sites-use of calcium, vitamin D and resistive exercises were  discussed.  Other medical problems are listed as follows:  Chronic laryngitis  COVID-19 virus infection  Gastroesophageal reflux disease without esophagitis  Mild episode of recurrent major depressive disorder ([place])  Mixed hyperlipidemia  Orders: Orders Placed This Encounter  Procedures  . DG Chest 2 View  . QuantiFERON-TB Gold Plus   Meds ordered this encounter  Medications  . methotrexate (RHEUMATREX) 2.5 MG tablet    Sig: TAKE 6 TABLETS BY MOUTH  ONCE WEEKLY (CAUTION:  CHEMOTHERAPY; PROTECT FROM  LIGHT)  Dispense:  72 tablet    Refill:  0    Follow-Up Instructions: Return in about 5 months (around 09/12/2020) for Rheumatoid arthritis.   Bo Merino, MD  Note - This record has been created using Editor, commissioning.  Chart creation errors have been sought, but may not always  have been located. Such creation errors do not reflect on  the standard of medical care.

## 2020-04-12 ENCOUNTER — Encounter: Payer: Self-pay | Admitting: Rheumatology

## 2020-04-12 ENCOUNTER — Ambulatory Visit: Payer: 59 | Admitting: Rheumatology

## 2020-04-12 ENCOUNTER — Ambulatory Visit (HOSPITAL_COMMUNITY)
Admission: RE | Admit: 2020-04-12 | Discharge: 2020-04-12 | Disposition: A | Discharge status: Home / Self Care | Payer: 59 | Source: Ambulatory Visit | Attending: Rheumatology | Admitting: Rheumatology

## 2020-04-12 ENCOUNTER — Other Ambulatory Visit: Payer: Self-pay

## 2020-04-12 VITALS — BP 128/83 | HR 69 | Ht 63.0 in | Wt 154.0 lb

## 2020-04-12 DIAGNOSIS — M8589 Other specified disorders of bone density and structure, multiple sites: Secondary | ICD-10-CM

## 2020-04-12 DIAGNOSIS — M0579 Rheumatoid arthritis with rheumatoid factor of multiple sites without organ or systems involvement: Secondary | ICD-10-CM

## 2020-04-12 DIAGNOSIS — J370 Chronic laryngitis: Secondary | ICD-10-CM

## 2020-04-12 DIAGNOSIS — U071 COVID-19: Secondary | ICD-10-CM

## 2020-04-12 DIAGNOSIS — Z79899 Other long term (current) drug therapy: Secondary | ICD-10-CM | POA: Diagnosis not present

## 2020-04-12 DIAGNOSIS — M0630 Rheumatoid nodule, unspecified site: Secondary | ICD-10-CM

## 2020-04-12 DIAGNOSIS — R0781 Pleurodynia: Secondary | ICD-10-CM | POA: Diagnosis present

## 2020-04-12 DIAGNOSIS — F330 Major depressive disorder, recurrent, mild: Secondary | ICD-10-CM

## 2020-04-12 DIAGNOSIS — R5383 Other fatigue: Secondary | ICD-10-CM

## 2020-04-12 DIAGNOSIS — I7300 Raynaud's syndrome without gangrene: Secondary | ICD-10-CM

## 2020-04-12 DIAGNOSIS — M3500 Sicca syndrome, unspecified: Secondary | ICD-10-CM

## 2020-04-12 DIAGNOSIS — K219 Gastro-esophageal reflux disease without esophagitis: Secondary | ICD-10-CM

## 2020-04-12 DIAGNOSIS — M2241 Chondromalacia patellae, right knee: Secondary | ICD-10-CM

## 2020-04-12 DIAGNOSIS — M2242 Chondromalacia patellae, left knee: Secondary | ICD-10-CM

## 2020-04-12 DIAGNOSIS — E782 Mixed hyperlipidemia: Secondary | ICD-10-CM

## 2020-04-12 DIAGNOSIS — R7611 Nonspecific reaction to tuberculin skin test without active tuberculosis: Secondary | ICD-10-CM

## 2020-04-12 MED ORDER — METHOTREXATE 2.5 MG PO TABS: ORAL_TABLET | ORAL | 0 refills | Status: DC

## 2020-04-12 NOTE — Patient Instructions (Addendum)
Standing Labs We placed an order today for your standing lab work.   Please have your standing labs drawn in June and every 3 months  If possible, please have your labs drawn 2 weeks prior to your appointment so that the provider can discuss your results at your appointment.  We have open lab daily Monday through Thursday from 1:30-4:30 PM and Friday from 1:30-4:00 PM at the office of Dr. Bo Merino, [facility].   Please be advised, all patients with office appointments requiring lab work will take precedents over walk-in lab work.  If possible, please come for your lab work on Monday and Friday afternoons, as you may experience shorter wait times. The office is located at [address] [place], [place], [place] [address] No appointment is necessary.   Labs are drawn by [facility]. Please bring your co-pay at the time of your lab draw.  You may receive a bill from [facility] for your lab work.  If you wish to have your labs drawn at another location, please call the office 24 hours in advance to send orders.  If you have any questions regarding directions or hours of operation,  please call [phone].   As a reminder, please drink plenty of water prior to coming for your lab work. Thanks!  Vaccines You are taking a medication(s) that can suppress your immune system.  The following immunizations are recommended: . Flu annually . Covid-19  . Pneumonia (Pneumovax 23 and Prevnar 13 spaced at least 1 year apart) . Shingrix (after age 64)  Please check with your PCP to make sure you are up to date.   Heart Disease Prevention   Your inflammatory disease increases your risk of heart disease which includes heart attack, stroke, atrial fibrillation (irregular heartbeats), high blood pressure, heart failure and atherosclerosis (plaque in the arteries).  It is important to reduce your risk by:   . Keep blood pressure, cholesterol, and blood sugar at healthy levels    . Smoking Cessation   . Maintain a healthy weight  o BMI 20-25   . Eat a healthy diet  o Plenty of fresh fruit, vegetables, and whole grains  o Limit saturated fats, foods high in sodium, and added sugars  o DASH and Mediterranean diet   . Increase physical activity  o Recommend moderate physically activity for 150 minutes per week/ 30 minutes a day for five days a week These can be broken up into three separate ten-minute sessions during the day.   . Reduce Stress  . Meditation, slow breathing exercises, yoga, coloring books  . Dental visits twice a year

## 2020-04-13 ENCOUNTER — Ambulatory Visit (INDEPENDENT_AMBULATORY_CARE_PROVIDER_SITE_OTHER): Payer: 59

## 2020-04-13 ENCOUNTER — Encounter: Payer: Self-pay | Admitting: Family Medicine

## 2020-04-13 ENCOUNTER — Other Ambulatory Visit: Payer: Self-pay

## 2020-04-13 ENCOUNTER — Ambulatory Visit: Payer: 59 | Admitting: Family Medicine

## 2020-04-13 ENCOUNTER — Ambulatory Visit (HOSPITAL_COMMUNITY)
Admission: EM | Admit: 2020-04-13 | Discharge: 2020-04-13 | Disposition: A | Discharge status: Home / Self Care | Payer: 59 | Attending: Urgent Care | Admitting: Urgent Care

## 2020-04-13 ENCOUNTER — Encounter (HOSPITAL_COMMUNITY): Payer: Self-pay

## 2020-04-13 VITALS — BP 116/80 | HR 80 | Wt 154.1 lb

## 2020-04-13 DIAGNOSIS — M069 Rheumatoid arthritis, unspecified: Secondary | ICD-10-CM | POA: Diagnosis not present

## 2020-04-13 DIAGNOSIS — Z23 Encounter for immunization: Secondary | ICD-10-CM

## 2020-04-13 DIAGNOSIS — R0789 Other chest pain: Secondary | ICD-10-CM

## 2020-04-13 DIAGNOSIS — R0781 Pleurodynia: Secondary | ICD-10-CM

## 2020-04-13 DIAGNOSIS — M329 Systemic lupus erythematosus, unspecified: Secondary | ICD-10-CM | POA: Diagnosis not present

## 2020-04-13 LAB — POCT URINALYSIS DIPSTICK, ED / UC
Bilirubin Urine: NEGATIVE
Glucose, UA: NEGATIVE mg/dL
Hgb urine dipstick: NEGATIVE
Ketones, ur: NEGATIVE mg/dL
Leukocytes,Ua: NEGATIVE
Nitrite: NEGATIVE
Protein, ur: NEGATIVE mg/dL
Specific Gravity, Urine: 1.02 (ref 1.005–1.030)
Urobilinogen, UA: 0.2 mg/dL (ref 0.0–1.0)
pH: 7 (ref 5.0–8.0)

## 2020-04-13 MED ORDER — ZOSTER VAC RECOMB ADJUVANTED 50 MCG/0.5ML IM SUSR: 0.5000 mL | Freq: Once | INTRAMUSCULAR | 0 refills | Status: AC

## 2020-04-13 MED ORDER — TIZANIDINE HCL 4 MG PO TABS: 4.0000 mg | ORAL_TABLET | Freq: Three times a day (TID) | ORAL | 0 refills | Status: DC | PRN

## 2020-04-13 NOTE — Patient Instructions (Signed)
Our plan for today: I think your pain is likely musculoskeletal.  It should get better in the next 7 to 14 days.  If it is not improving in the next 7 days I would like to see you back. -If you develop any trouble breathing, crushing chest pains, fevers, or other concerning symptoms I would like for you to go to the emergency department or return -I would recommend that she use Tylenol and Voltaren gel, also called diclofenac gel as well as the muscle relaxer that the urgent care prescribed to you over the next few days to see if this improves the discomfort.

## 2020-04-13 NOTE — Discharge Instructions (Addendum)
Do not use any nonsteroidal anti-inflammatories (NSAIDs) like ibuprofen, Motrin, naproxen, Aleve, etc. which are all available over-the-counter.  Please just use Tylenol at a dose of 500mg -650mg  once every 6 hours as needed for your aches, pains, fevers. You can use tizanidine as a muscle relaxant with Tylenol. Follow up as soon as possible with your PCP to see if they want to pursue more labs, chest CT scan. If your pain continues and is severe then please just report to the emergency room.

## 2020-04-13 NOTE — Progress Notes (Addendum)
    SUBJECTIVE:   CHIEF COMPLAINT / HPI:   Follow-up-urgent care-rib pain: Patient is a 55 year old female who presents today for follow-up after urgent care visit. Patient was evaluated in the urgent care earlier today with right-sided chest wall tenderness worse on palpation.  Patient did not endorse shortness of breath and her vitals were reassuring so she was recommended to follow-up with her PCP or with someone else at her PCPs clinic.  She was recommended to use scheduled Tylenol and tizanidine for discomfort.  Today she states that on Friday she noted some pain under her right breast. She couldn't find a specific area that seemed to hurt but on Saturday it was worse. She notices it more when leaning forward and opening/closing doors or when taking a deep breath. She denies shortness of breath.  She takes methotrexate and xeljanz for history of RA.    PERTINENT  PMH / PSH: Hx of RA  OBJECTIVE:   BP 116/80   Pulse 80   Wt 154 lb 2 oz (69.9 kg)   LMP 05/05/2014 (Approximate)   SpO2 98%   BMI 27.30 kg/m    General: NAD, pleasant, able to participate in exam Cardiac: RRR, no murmurs. Respiratory: CTAB, normal effort, No wheezes, rales or rhonchi Abdomen: Bowel sounds present, nontender MSK: Patient with chest wall tenderness to palpation on the right side just inferior to the right breast along the line of the rib.  This is worse when the patient takes a deep breath with her ribs expanding against the palpating hand.   Skin: warm and dry, no rashes noted Neuro: alert, no obvious focal deficits Psych: Normal affect and mood  ASSESSMENT/PLAN:   Rib pain on right side Assessment: 55 year old female with right rib pain for about 5 days.  She has had x-rays in the urgent care which did not show any obvious rib fracture though these may have missed a small hairline fracture.  Patient does not have any shortness of breath or other chest pain.  Her pain is sharp in consistency and  located at a specific area under her right breast along the line of the rib.  It is worse when she takes a deep breath with the rib expanded against the provider's hand.  Overall differential can include musculoskeletal cause including a rib fracture, other discomfort to muscles or surrounding tissue, early shingles, unlikely PE with no respiratory distress or tachycardia, unlikely infectious etiology with no fevers or other concerning symptoms and clear chest x-ray, unlikely cardiovascular cause with the pain reproducible on palpation. Plan: -Provided return precautions -Recommended the patient follow-up with [place] in 7 days using conservative treatments including acetaminophen and Voltaren gel in the meantime -Discussed with her that I expect that this is likely musculoskeletal in etiology and would likely improve in the next 7 days.     Lurline Del, [place]    This note was prepared using Dragon voice recognition software and may include unintentional dictation errors due to the inherent limitations of voice recognition software.

## 2020-04-13 NOTE — ED Triage Notes (Signed)
Pt c/o a sharp pain on RUQ x 2 days. Pt states she takes a medication that can cause blood clots. She states the pain has moved to the lower back.

## 2020-04-13 NOTE — Progress Notes (Deleted)
    SUBJECTIVE:   CHIEF COMPLAINT / HPI:   ***  PERTINENT  PMH / PSH: Sjogren's, GERD, rheumatoid arthritis, depression, iron deficiency anemia, Raynaud's, HLD  OBJECTIVE:   LMP 05/05/2014 (Approximate)   ***  ASSESSMENT/PLAN:   No problem-specific Assessment & Plan notes found for this encounter.     Cleophas Dunker, DO [place]   {    This will disappear when note is signed, click to select method of visit    :1}

## 2020-04-13 NOTE — Assessment & Plan Note (Signed)
Assessment: 55 year old female with right rib pain for about 5 days.  She has had x-rays in the urgent care which did not show any obvious rib fracture though these may have missed a small hairline fracture.  Patient does not have any shortness of breath or other chest pain.  Her pain is sharp in consistency and located at a specific area under her right breast along the line of the rib.  It is worse when she takes a deep breath with the rib expanded against the provider's hand.  Overall differential can include musculoskeletal cause including a rib fracture, other discomfort to muscles or surrounding tissue, early shingles, unlikely PE with no respiratory distress or tachycardia, unlikely infectious etiology with no fevers or other concerning symptoms and clear chest x-ray, unlikely cardiovascular cause with the pain reproducible on palpation. Plan: -Provided return precautions -Recommended the patient follow-up with [place] in 7 days using conservative treatments including acetaminophen and Voltaren gel in the meantime -Discussed with her that I expect that this is likely musculoskeletal in etiology and would likely improve in the next 7 days.

## 2020-04-13 NOTE — Progress Notes (Signed)
Chest x-ray is within normal limits.  Mild scoliosis was noted in the thoracic region which is unchanged.

## 2020-04-13 NOTE — ED Provider Notes (Signed)
[place]   MRN: [id number] DOB: 04-Feb-1965  Subjective:   Jenna Gordon is a 55 y.o. female presenting for 2-day history of acute onset moderate to severe right-sided chest wall pain.  Patient has been examining her breast but feels like the breast is not a problem.  Symptoms are worse when patient takes a deep breath or bends or twists at the level of her torso.  She went to her rheumatologist and had a chest x-ray done but was advised to come into our clinic today.  Her rheumatologist advised that this is not related to her rheumatology or lupus.  Denies any fall, trauma, shortness of breath, rash, bruising.  Patient does take methotrexate for her RA.  Denies history of clots.  No current facility-administered medications for this encounter.  Current Outpatient Medications:  .  famotidine (PEPCID) 20 MG tablet, TAKE 1 TABLET BY MOUTH  TWICE DAILY, Disp: 180 tablet, Rfl: 3 .  folic acid (FOLVITE) 1 MG tablet, Take 1 tablet (1 mg total) by mouth daily., Disp: 90 tablet, Rfl: 3 .  methotrexate (RHEUMATREX) 2.5 MG tablet, TAKE 6 TABLETS BY MOUTH  ONCE WEEKLY (CAUTION:  CHEMOTHERAPY; PROTECT FROM  LIGHT), Disp: 72 tablet, Rfl: 0 .  Multiple Vitamin (MULTIVITAMIN PO), Take by mouth daily., Disp: , Rfl:  .  Omega-3 Fatty Acids (FISH OIL PO), Take by mouth daily., Disp: , Rfl:  .  sertraline (ZOLOFT) 100 MG tablet, TAKE 1 TABLET BY MOUTH  DAILY, Disp: 90 tablet, Rfl: 3 .  XELJANZ XR 11 MG TB24, TAKE 1 TABLET BY MOUTH  DAILY, Disp: 30 tablet, Rfl: 2   Allergies  Allergen Reactions  . Cefzil [Cefprozil]   . Macrobid [Nitrofurantoin] Hives  . Sulfa Antibiotics Hives    Past Medical History:  Diagnosis Date  . Amnesia memory loss 10/14/2012  . Dry eyes   . History of glaucoma    s/p   bilateral eye laser treatment  2007  . 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 Lupus (systemic lupus erythematosus) ([place])   . 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 Rheumatoid arthritis(714.0)    Dr. Dondra Spry, [facility]  . Sjogren's disease ([place])   . SUI (stress urinary incontinence, female)   . Wears glasses      Past Surgical History:  Procedure Laterality Date  . CARPAL TUNNEL RELEASE Bilateral 2007  . CERVICAL CONIZATION W/BX  1994  . CESAREAN SECTION  x2  last one 1999  . GLAUCOMA SURGERY Bilateral 2007   laser  . HYSTEROSCOPY WITH D & C  03-02-2010  . MASS EXCISION Left 05/21/2013   Procedure: EXCISION MASS LEFT LONG FINGER;  Surgeon: Linna Hoff, MD;  Location: [facility];  Service: Orthopedics;  Laterality: Left;  . SHOULDER ARTHROSCOPY WITH OPEN ROTATOR CUFF REPAIR Right 2012  . TENOSYNOVECTOMY Left 05/21/2013   Procedure: LEFT INDEX FINGER TENOSYNOVECTOMY;  Surgeon: Linna Hoff, MD;  Location: [facility];  Service: Orthopedics;  Laterality: Left;  . TRIGGER FINGER RELEASE  2009   left middle finger  . TYMPANOPLASTY Left 1993    Family History  Problem Relation Age of Onset  . Hypertension Mother   . CVA Mother        TIA  .  Cystic fibrosis Mother   . Stomach cancer Father   . Diabetes Father   . Hypertension Father   . Hypertension Brother   . Hypertension Brother   . Healthy Daughter   . Healthy Daughter     Social History   Tobacco Use  . Smoking status: Never Smoker  . Smokeless tobacco: Never Used  Vaping Use  . Vaping Use: Never used  Substance Use Topics  . Alcohol use: Yes    Comment: wine occ  . Drug use: No    ROS   Objective:   Vitals: BP 115/73 (BP Location: Left Arm)   Pulse 77   Temp 98.3 F (36.8 C) (Oral)   Resp 16   LMP 05/05/2014 (Approximate)   SpO2 98%   Physical Exam Constitutional:      General: She is not in acute distress.    Appearance: Normal appearance. She is well-developed.  She is not ill-appearing, toxic-appearing or diaphoretic.  HENT:     Head: Normocephalic and atraumatic.     Nose: Nose normal.     Mouth/Throat:     Mouth: Mucous membranes are moist.     Pharynx: Oropharynx is clear.  Eyes:     General: No scleral icterus.    Extraocular Movements: Extraocular movements intact.     Pupils: Pupils are equal, round, and reactive to light.  Cardiovascular:     Rate and Rhythm: Normal rate and regular rhythm.     Pulses: Normal pulses.     Heart sounds: Normal heart sounds. No murmur heard. No friction rub. No gallop.   Pulmonary:     Effort: Pulmonary effort is normal. No respiratory distress.     Breath sounds: Normal breath sounds. No stridor. No wheezing, rhonchi or rales.  Chest:     Chest wall: Tenderness present.    Skin:    General: Skin is warm and dry.     Findings: No rash.  Neurological:     General: No focal deficit present.     Mental Status: She is alert and oriented to person, place, and time.  Psychiatric:        Mood and Affect: Mood normal.        Behavior: Behavior normal.        Thought Content: Thought content normal.     DG Ribs Unilateral W/Chest Right  Result Date: 04/13/2020 CLINICAL DATA:  Right upper quadrant pain that started on Friday. No trauma history EXAM: RIGHT RIBS AND CHEST - 3+ VIEW COMPARISON:  04/06/2019 FINDINGS: No fracture or other bone lesions are seen involving the ribs. There is no evidence of pneumothorax or pleural effusion. Both lungs are clear. Heart size and mediastinal contours are within normal limits. IMPRESSION: Negative. Electronically Signed   By: Monte Fantasia M.D.   On: 04/13/2020 09:46    Results for orders placed or performed during the hospital encounter of 04/13/20 (from the past 24 hour(s))  POC Urinalysis dipstick     Status: None   Collection Time: 04/13/20  9:11 AM  Result Value Ref Range   Glucose, UA NEGATIVE NEGATIVE mg/dL   Bilirubin Urine NEGATIVE NEGATIVE   Ketones,  ur NEGATIVE NEGATIVE mg/dL   Specific Gravity, Urine 1.020 1.005 - 1.030   Hgb urine dipstick NEGATIVE NEGATIVE   pH 7.0 5.0 - 8.0   Protein, ur NEGATIVE NEGATIVE mg/dL   Urobilinogen, UA 0.2 0.0 - 1.0 mg/dL   Nitrite NEGATIVE NEGATIVE   Leukocytes,Ua NEGATIVE NEGATIVE  Assessment and Plan :   PDMP not reviewed this encounter.  1. Chest wall pain   2. Rib pain   3. Rheumatoid arthritis, involving unspecified site, unspecified whether rheumatoid factor present ([place])   4. Lupus ([place])     55 year old female with past medical history of rheumatoid arthritis, lupus on methotrexate presenting for 2-day history of moderate to severe right-sided chest wall pain.  Patient had concerns about pulmonary embolism given her medications for her chronic conditions including methotrexate and Xeljanz.  She did see her rheumatoid specialist but was advised that this was not related to her rheumatoid or lupus.  She was advised to go to the emergency room.  She contacted her PCP but unfortunately was not able to see them so she came to our clinic.  On exam, patient has frank chest wall tenderness over the right anterior ribs just under the right breast.  No abdominal tenderness, breast tenderness.  Patient does not endorse shortness of breath and her vital signs are very reassuring.  Given her concerns, recommended either following up with PCP for further evaluation or presenting to the emergency room after chest pain persist.  In the meantime, scheduled Tylenol, use tizanidine.  Maintain strict ER precautions.   Jaynee Eagles, [place] 04/13/20 [phone]

## 2020-04-14 ENCOUNTER — Ambulatory Visit: Payer: 59

## 2020-04-14 LAB — QUANTIFERON-TB GOLD PLUS
Mitogen-NIL: 8.83 IU/mL
NIL: 0.12 IU/mL
QuantiFERON-TB Gold Plus: NEGATIVE
TB1-NIL: 0.02 IU/mL
TB2-NIL: 0.07 IU/mL

## 2020-04-18 NOTE — Progress Notes (Signed)
    SUBJECTIVE:   CHIEF COMPLAINT / HPI:   Follow-up-rib pain on right side: Patient is 55 year old female that presents today for follow-up after seeing me on 04/13/2020 for rib pain occurring on her right side.  At that time he had been going on for about 5 days.  Our plan was to monitor this for about 1 week to see how her symptoms were improving she had x-rays which were negative and no signs concerning for ACS, PE, dissection, with the most likely etiology thought to be musculoskeletal.  Today the patient states her pain is still present and is about the same. She states the tylenol and muscle relaxer helps a bit but it has not improved. It is under her right chest and sometimes with palpation radiated toward her sternum. No difference in pain around meals, no fevers. Pain seems to be a constant ache.   PERTINENT  PMH / PSH: Hx of RA  OBJECTIVE:   BP 120/80   Pulse 76   Ht 5\' 3"  (1.6 m)   Wt 153 lb 4 oz (69.5 kg)   LMP 05/05/2014 (Approximate)   SpO2 99%   BMI 27.15 kg/m    General: NAD, pleasant, able to participate in exam Cardiac: RRR, no murmurs. Respiratory: CTAB, normal effort, No wheezes, rales or rhonchi Abdomen: Nontender, negative Murphy sign, no hepato-splenomegaly MSK: Small area of pinpoint tenderness again along the rib just under the right breast at the mid clavicular line.  There is no skin changes to this region.   Skin: warm and dry, no rashes noted Psych: Normal affect and mood  ASSESSMENT/PLAN:   Rib pain on right side Assessment: 55 year old female presenting for the second time with right-sided pain which is pinpoint along the midclavicular line of her approximately fifth right rib.  Patient denies any fevers, night sweats, shortness of breath, chest pain.  This area is painful to touch and sometimes does radiate along the rib line.  We did previously do x-rays which were negative.  I discussed this case with Dr. Andria Frames who recommended that we may not see  anything on x-rays due to the fact that this is near the cartilaginous junction.  Patient does have a history of rheumatoid arthritis.  I discussed with her that it can take up to 6 weeks for a cartilaginous injury to heal and because this is so pinpoint on exam that I recommend continue conservative treatment for a few more weeks to see if it continues to improve. Plan: -Recommended her try Lidoderm/lidocaine patches -Continue Voltaren gel -Return precautions given -Follow-up in 4 weeks or sooner if symptoms change    Lurline Del, DO [place]    This note was prepared using Dragon voice recognition software and may include unintentional dictation errors due to the inherent limitations of voice recognition software.

## 2020-04-18 NOTE — Patient Instructions (Signed)
It was great to see you! Thank you for allowing me to participate in your care!  I recommend that you always bring your medications to each appointment as this makes it easy to ensure we are on the correct medications and helps [place] not miss when refills are needed.  Our plans for today:  -I would like for you to try lidocaine/Lidoderm patches.  You can continue using Voltaren gel as well.  I think this is most likely cartilaginous injury to the rib and this may take up to 6 weeks to heal.  If it does not get better in the next 4 weeks, about 6 weeks from the time it started please return.  This also makes sense on why we did not see anything on the x-ray -If you develop any trouble breathing, crushing chest pains, fevers, sweats night sweats, or other concerning symptoms do not hesitate to return.  Take care and seek immediate care sooner if you develop any concerns.   Dr. Lurline Del, [place]

## 2020-04-19 ENCOUNTER — Other Ambulatory Visit: Payer: Self-pay

## 2020-04-19 ENCOUNTER — Ambulatory Visit: Payer: 59 | Admitting: Family Medicine

## 2020-04-19 ENCOUNTER — Encounter: Payer: Self-pay | Admitting: Family Medicine

## 2020-04-19 DIAGNOSIS — R0781 Pleurodynia: Secondary | ICD-10-CM | POA: Diagnosis not present

## 2020-04-19 NOTE — Assessment & Plan Note (Signed)
Assessment: 55 year old female presenting for the second time with right-sided pain which is pinpoint along the midclavicular line of her approximately fifth right rib.  Patient denies any fevers, night sweats, shortness of breath, chest pain.  This area is painful to touch and sometimes does radiate along the rib line.  We did previously do x-rays which were negative.  I discussed this case with Dr. Andria Frames who recommended that we may not see anything on x-rays due to the fact that this is near the cartilaginous junction.  Patient does have a history of rheumatoid arthritis.  I discussed with her that it can take up to 6 weeks for a cartilaginous injury to heal and because this is so pinpoint on exam that I recommend continue conservative treatment for a few more weeks to see if it continues to improve. Plan: -Recommended her try Lidoderm/lidocaine patches -Continue Voltaren gel -Return precautions given -Follow-up in 4 weeks or sooner if symptoms change

## 2020-05-24 ENCOUNTER — Other Ambulatory Visit: Payer: Self-pay

## 2020-05-24 ENCOUNTER — Ambulatory Visit: Payer: 59 | Admitting: Family Medicine

## 2020-05-24 ENCOUNTER — Encounter: Payer: Self-pay | Admitting: Family Medicine

## 2020-05-24 VITALS — BP 102/75 | HR 74 | Ht 62.0 in | Wt 157.4 lb

## 2020-05-24 DIAGNOSIS — L559 Sunburn, unspecified: Secondary | ICD-10-CM | POA: Diagnosis not present

## 2020-05-24 DIAGNOSIS — R21 Rash and other nonspecific skin eruption: Secondary | ICD-10-CM

## 2020-05-24 DIAGNOSIS — F339 Major depressive disorder, recurrent, unspecified: Secondary | ICD-10-CM

## 2020-05-24 DIAGNOSIS — R0781 Pleurodynia: Secondary | ICD-10-CM | POA: Diagnosis not present

## 2020-05-24 MED ORDER — SERTRALINE HCL 100 MG PO TABS: 150.0000 mg | ORAL_TABLET | Freq: Every day | ORAL | 1 refills | Status: DC

## 2020-05-24 NOTE — Assessment & Plan Note (Addendum)
Known triggers are large life events which pt is anticipating soon in daughter moving out. Pt currently with some features of uncontrolled depression in sleeping later and not wanting to get out of bed. PHQ-9 was 5 today.   Will increase to 150mg  Zoloft RTC in 1 month

## 2020-05-24 NOTE — Patient Instructions (Signed)
It was great to see you again today!  Go up to 150mg  daily of sertraline Follow up with me in 1 month  Be well, Dr. Ardelia Mems

## 2020-05-24 NOTE — Progress Notes (Signed)
SUBJECTIVE:   CHIEF COMPLAINT / HPI:   Depression Jenna Gordon returns today for management and evaluation of her depression. She notes that she has had some difficulty getting out of bed in the mornings and some sense of vague fatigue. She notes that her daughter is moving out of the house in the next 2-3 months and that this is likely triggering some depression for the pt. The pt had a similar depression trigger when her other daughter moved out of the house, and with other big life changes. She has continued on 100mg  Zoloft. Is interested in increasing the dose of her medication.  Costochondritis The pt notes that her costochondritis has improved over the last several weeks. Initially began around March 14. Was evaluated by her Rheumatologist who, per the pt, was more concerned at that time that pt could be having a PE in setting of medications and medical history. Pt notes her pain has responded well to voltaren gel and she has stopped using it alongside observed symptomatic improvement. Denies pain limiting her at this time, but is worsened with certain movements.  Sun Burn Pt notes that she was outside on a sunny day for several hours taking graduation pictures with her daughter last week. Then developed sunburn on her face. Has improved in the last week and has been mildly pruritic but has responded well to Triamcinolone and aloe. Denies burning sensation.    OBJECTIVE:   BP 102/75   Pulse 74   Ht 5\' 2"  (1.575 m)   Wt 157 lb 6.4 oz (71.4 kg)   LMP 05/05/2014 (Approximate)   SpO2 97%   BMI 28.79 kg/m   Physical Exam Constitutional:      Appearance: Normal appearance.  Cardiovascular:     Rate and Rhythm: Normal rate and regular rhythm.     Heart sounds: Normal heart sounds.  Pulmonary:     Effort: Pulmonary effort is normal.     Breath sounds: Normal breath sounds.  Chest:     Chest wall: Tenderness present.  Abdominal:     Palpations: Abdomen is soft.  Skin:     General: Skin is warm and dry.  Neurological:     Mental Status: She is alert and oriented to person, place, and time.  Psychiatric:        Mood and Affect: Mood normal.        Behavior: Behavior normal.      ASSESSMENT/PLAN:   Depression, recurrent (HCC) Known triggers are large life events which pt is anticipating soon in daughter moving out. Pt currently with some features of uncontrolled depression in sleeping later and not wanting to get out of bed. PHQ-9 was 5 today.   Will increase to 150mg  Zoloft RTC in 1 month   Rib pain on right side Costochondritis improving well but worsens with exertion and deep breathing. Pt denies this limiting her.  Can resume Voltaren gel prn which has been helpful for the pt  Rash Mildly erythematous and pruritic rash on b/l temples. Presented after prolonged sun exposure and has improved with topical Triamcinolone and aloe.  Advised no more than one more week of Triamcinolone application Continue moisturizing well Advised habit of wearing sunscreen when pt plans to be in direct sunlight    [place]    Patient seen along with MS[employer] student Baldwin [place]. I personally evaluated this patient along with the student, and verified all aspects of the history, physical  exam, and medical decision making as documented by the student. I agree with the student's documentation and have made all necessary edits.  Chrisandra Netters, MD  [place]

## 2020-05-24 NOTE — Assessment & Plan Note (Signed)
Mildly erythematous and pruritic rash on b/l temples. Presented after prolonged sun exposure and has improved with topical Triamcinolone and aloe.  Advised no more than one more week of Triamcinolone application Continue moisturizing well Advised habit of wearing sunscreen when pt plans to be in direct sunlight

## 2020-05-24 NOTE — Assessment & Plan Note (Addendum)
Costochondritis improving well but worsens with exertion and deep breathing. Pt denies this limiting her.  Can resume Voltaren gel prn which has been helpful for the pt

## 2020-06-21 ENCOUNTER — Other Ambulatory Visit: Payer: Self-pay

## 2020-06-21 ENCOUNTER — Ambulatory Visit: Payer: 59 | Admitting: Family Medicine

## 2020-06-21 DIAGNOSIS — F339 Major depressive disorder, recurrent, unspecified: Secondary | ICD-10-CM

## 2020-06-21 NOTE — Progress Notes (Signed)
    SUBJECTIVE:   CHIEF COMPLAINT / HPI:   Jenna Gordon returns today for follow up.  Depression Jenna Gordon was increased from 100mg  to 150mg  Zoloft at our last visit one month ago due to difficulty getting out of bed in mornings and a vague sense of fatigue in the setting of anticipating her daughter moving out. She reports much improved energy and motivation today, specifically mentioning that she gardened this week for 4-5 hours and had a wonderful time doing so. Currently feels 150mg  Zoloft is satisfactorily treating her depression. No SI/HI.  Neuropathy Jenna Gordon also endorses a light sensation of pins and needles around the base of her neck which presents about 3 times per week, for a couple minutes, and began 1.5 months ago. She notes these symptoms present when bends over or carries heavy groceries. Denies similar sensations when turning her head, or raising her arms above her head. Denies neck pain or neuropathy radiating into either of her arms. Denies constitutional symptoms, light headedness, or syncope.   OBJECTIVE:   BP 122/80   Pulse 90   Ht 5\' 4"  (1.626 m)   Wt 160 lb (72.6 kg)   LMP 05/05/2014 (Approximate)   SpO2 98%   BMI 27.46 kg/m   Physical Exam Constitutional:      Appearance: Normal appearance.  HENT:     Head: Normocephalic and atraumatic.  Cardiovascular:     Rate and Rhythm: Normal rate and regular rhythm.     Pulses: Normal pulses.     Heart sounds: Normal heart sounds.  Pulmonary:     Effort: Pulmonary effort is normal.     Breath sounds: Normal breath sounds.  Skin:    General: Skin is warm and dry.  Neurological:     General: No focal deficit present.     Mental Status: She is alert and oriented to person, place, and time.  Psychiatric:        Mood and Affect: Mood normal.        Behavior: Behavior normal.   neck: full ROM, negative spurlings, grip 5/5 bilaterally R temple with area of dryness  ASSESSMENT/PLAN:   Depression, recurrent  (HCC) Well controlled after increase to 150mg  Zoloft. PHQ9 score of 1.  Continue 150mg  Zoloft   Tingling of neck Unclear etiology. Negative spurlings, full ROM of neck, no skin changes appreciated. Will monitor, patient agreeable. As may be dry skin, recommend moisturizing area to see if it improves.   R temple dry area - recommend moisturizing, if not improving may need to see derm, could be [place]  Follow up in 3 months, sooner if needed  [place]    Patient seen along with MS3 student Baldwin [place]. I personally evaluated this patient along with the student, and verified all aspects of the history, physical exam, and medical decision making as documented by the student. I agree with the student's documentation and have made all necessary edits.  Chrisandra Netters, MD  [place]

## 2020-06-21 NOTE — Patient Instructions (Addendum)
Glad you're doing better. Stay on current dose of medication  Try moisturizing the area on your R temple with vaseline Also the area around your neck and upper chest - let's see if this makes a difference.  Follow up with me in 3 months, sooner if needed  Be well, Dr. Ardelia Mems

## 2020-06-21 NOTE — Assessment & Plan Note (Signed)
Well controlled after increase to 150mg  Zoloft. PHQ9 score of 1.  Continue 150mg  Zoloft

## 2020-07-30 ENCOUNTER — Other Ambulatory Visit: Payer: Self-pay | Admitting: Physician Assistant

## 2020-08-01 NOTE — Telephone Encounter (Signed)
Next Visit: 09/13/2020  Last Visit: 04/12/2020  Last Fill: 03/28/2020  OI:[id number] arthritis involving multiple sites with positive rheumatoid factor  Current Dose per office note 04/12/2020: Morrie Sheldon 11 mg XR daily  Labs: 03/22/2020 Lymphocyte count is low due to the use of methotrexate and Xeljanz.  We willcontinue to monitor closely.  CMP is normal.  TB Gold: 04/12/2020 Neg    Left message to advise patient she is due to update labs.   Okay to refill Morrie Sheldon?

## 2020-08-02 ENCOUNTER — Other Ambulatory Visit: Payer: Self-pay

## 2020-08-02 DIAGNOSIS — Z79899 Other long term (current) drug therapy: Secondary | ICD-10-CM

## 2020-08-03 ENCOUNTER — Encounter: Payer: Self-pay | Admitting: Rheumatology

## 2020-08-03 ENCOUNTER — Encounter: Payer: Self-pay | Admitting: Family Medicine

## 2020-08-03 ENCOUNTER — Telehealth: Payer: Self-pay | Admitting: *Deleted

## 2020-08-03 DIAGNOSIS — Z79899 Other long term (current) drug therapy: Secondary | ICD-10-CM

## 2020-08-03 LAB — COMPLETE METABOLIC PANEL WITH GFR
AG Ratio: 1.4 (calc) (ref 1.0–2.5)
ALT: 51 U/L — ABNORMAL HIGH (ref 6–29)
AST: 45 U/L — ABNORMAL HIGH (ref 10–35)
Albumin: 4.2 g/dL (ref 3.6–5.1)
Alkaline phosphatase (APISO): 73 U/L (ref 37–153)
BUN: 20 mg/dL (ref 7–25)
CO2: 27 mmol/L (ref 20–32)
Calcium: 9.2 mg/dL (ref 8.6–10.4)
Chloride: 104 mmol/L (ref 98–110)
Creat: 0.79 mg/dL (ref 0.50–1.03)
Globulin: 2.9 g/dL (calc) (ref 1.9–3.7)
Glucose, Bld: 108 mg/dL — ABNORMAL HIGH (ref 65–99)
Potassium: 4 mmol/L (ref 3.5–5.3)
Sodium: 140 mmol/L (ref 135–146)
Total Bilirubin: 0.3 mg/dL (ref 0.2–1.2)
Total Protein: 7.1 g/dL (ref 6.1–8.1)
eGFR: 88 mL/min/{1.73_m2} (ref >=60)

## 2020-08-03 LAB — CBC WITH DIFFERENTIAL/PLATELET
Absolute Monocytes: 608 cells/uL (ref 200–950)
Basophils Absolute: 9 cells/uL (ref 0–200)
Basophils Relative: 0.2 %
Eosinophils Absolute: 131 cells/uL (ref 15–500)
Eosinophils Relative: 2.9 %
HCT: 41.7 % (ref 35.0–45.0)
Hemoglobin: 13.7 g/dL (ref 11.7–15.5)
Lymphs Abs: 1094 cells/uL (ref 850–3900)
MCH: 30.4 pg (ref 27.0–33.0)
MCHC: 32.9 g/dL (ref 32.0–36.0)
MCV: 92.5 fL (ref 80.0–100.0)
MPV: 10.8 fL (ref 7.5–12.5)
Monocytes Relative: 13.5 %
Neutro Abs: 2660 cells/uL (ref 1500–7800)
Neutrophils Relative %: 59.1 %
Platelets: 185 10*3/uL (ref 140–400)
RBC: 4.51 10*6/uL (ref 3.80–5.10)
RDW: 13.1 % (ref 11.0–15.0)
Total Lymphocyte: 24.3 %
WBC: 4.5 10*3/uL (ref 3.8–10.8)

## 2020-08-03 NOTE — Progress Notes (Signed)
She should discontinue Tylenol use.  It is good that she is doing well without methotrexate.  We should repeat LFTs in 1 month.

## 2020-08-03 NOTE — Progress Notes (Signed)
CBC is normal.  Glucose is mildly elevated probably not a fasting sample.  LFTs are elevated.  Please advise patient to decrease methotrexate to 4 tablets p.o. weekly.

## 2020-08-03 NOTE — Telephone Encounter (Signed)
-----   Message from Bo Merino, MD sent at 08/03/2020  4:41 PM EDT ----- She should discontinue Tylenol use.  It is good that she is doing well without methotrexate.  We should repeat LFTs in 1 month.

## 2020-08-04 ENCOUNTER — Other Ambulatory Visit (HOSPITAL_COMMUNITY): Payer: Self-pay

## 2020-08-04 MED ORDER — NIRMATRELVIR/RITONAVIR (PAXLOVID)TABLET
3.0000 | ORAL_TABLET | Freq: Two times a day (BID) | ORAL | 0 refills | Status: AC
  Filled 2020-08-04: qty 30, 5d supply, fill #0

## 2020-08-04 NOTE — Telephone Encounter (Signed)
Called patient to discuss.  Symptoms began Monday 7/11. Tested positive x2 at home tests yesterday 7/13. Symptoms are goopy eyes, sore throat, congestion, cough.  Her rheumatologist has instructed her to stop her xeljanz (tofacitinib) until at least 7 days after her symptoms resolve. She did not take it today.  She is interested in paxlovid treatment. She is a good candidate given her mild symptoms and high risk for progression to severe disease. eGFR earlier this week was 88.  Will rx paxlovid to [facility]. Advised continuing to hold xeljanz until done with paxlovid x3 days.  Patient appreciative.   J , MD  

## 2020-08-26 ENCOUNTER — Ambulatory Visit: Payer: 59 | Admitting: Family Medicine

## 2020-08-26 ENCOUNTER — Ambulatory Visit
Admission: RE | Admit: 2020-08-26 | Discharge: 2020-08-26 | Disposition: A | Discharge status: Home / Self Care | Payer: 59 | Source: Ambulatory Visit | Attending: Family Medicine | Admitting: Family Medicine

## 2020-08-26 ENCOUNTER — Other Ambulatory Visit: Payer: Self-pay

## 2020-08-26 VITALS — BP 110/60 | HR 86 | Ht 63.0 in | Wt 155.4 lb

## 2020-08-26 DIAGNOSIS — R059 Cough, unspecified: Secondary | ICD-10-CM

## 2020-08-26 NOTE — Progress Notes (Signed)
  Date of Visit: 08/26/2020   SUBJECTIVE:   HPI:  Jenna Gordon presents today for an acute visit for cough.  Began coughing on Monday night, by Tuesday was having spells of coughing. Also having body aches and joint pains. No fevers. She was COVID positive back about a month ago, with symptoms beginning on 7/11 and tested positive on 7/13. She did a course of paxlovid and symptoms improved. While on paxlovid and having COVID symptoms she held her immunosuppressive med Morrie Sheldon, per her rheumatologist). She restarted the medication about 4-5 days prior to the onset of this new cough. A nurse at the [facility] told her it is possible this is paxlovid rebound vs reinfection. Chest is sore from coughing. She did go about 3 days ago and got PCR testing for COVID and flu A/B, all of which were negative.  OBJECTIVE:   BP 110/60   Pulse 86   Ht '5\' 3"'$  (1.6 m)   Wt 155 lb 6.4 oz (70.5 kg)   LMP 05/05/2014 (Approximate)   SpO2 96%   BMI 27.53 kg/m  Gen: no acute distress, pleasant, cooperative HEENT: normocephalic, atraumatic, moist mucous membranes, nares without discharge Heart: regular rate and rhythm, no murmur Lungs: clear to auscultation bilaterally, normal work of breathing. Frequent cough. Speaks in full sentences without distress. Neuro: alert, speech normal, grossly nonfocal  ASSESSMENT/PLAN:   Viral URI with cough Suspect this represents new infection with miscellaneous respiratory virus, given negative testing for COVID and flu earlier this week. Timeline from prior COVID infection seems too long for her to have developed paxlovid rebound, but also likely too short of an interval to have become reinfected. She is overall well appearing and in no respiratory distress. Given immunosuppressive medication, will check CXR to ensure no focal pneumonia, though lungs are clear on exam. Offered short burst of prednisone to help with cough/airway inflammation but patient declines, does not want her  immune system more suppressed than it already is. Advised ok to use OTC cough medications.   FOLLOW UP: Follow up as needed if symptoms worsen or do not improve.   [place]. Ardelia Mems, [place]

## 2020-08-26 NOTE — Patient Instructions (Signed)
It was great to see you again today!  Go get chest xray at [place] at [facility].  Hopefully this is just a routine virus that will get better with time.  Ok to use over the counter cough medications.   Be well, Dr. Ardelia Mems

## 2020-08-30 NOTE — Progress Notes (Signed)
Office Visit Note  Patient: Jenna Gordon             Date of Birth: 04-Nov-1965           MRN: [id number]             PCP: Leeanne Rio, MD Referring: Leeanne Rio, MD Visit Date: 09/13/2020 Occupation: '@GUAROCC'$ @  Subjective:  Pain in multiple joints   History of Present Illness: Jenna Gordon is a 55 y.o. female with history of rheumatoid arthritis. She is taking xeljanz 11 mg XR daily as monotherapy.  She discontinued methotrexate at the beginning of May 2022.  She denies any new or worsening symptoms since discontinuing methotrexate.  She states that she continues to have intermittent flares involving multiple joints.  She states that her joint pain and swelling is the same now as it was when she was on Xeljanz, methotrexate, and prednisone triple therapy. She reports that she was diagnosed with COVID-19 on 08/03/2020.  She held [place] for about 3 weeks during that time.  She restarted her medication in early August and has been tolerating it without any side effects.  She continues to have some residual joint pain since being off of Xeljanz.  She is currently having pain in the right wrist and at the base of the right fifth metatarsal.  According to the patient 3 days ago she rolled her right foot which she feels is causing her discomfort.  She does not want to make any medication changes at this time.   Activities of Daily Living:  Patient reports morning stiffness for 2-3 hours.   Patient Reports nocturnal pain.  Difficulty dressing/grooming: Reports Difficulty climbing stairs: Reports Difficulty getting out of chair: Denies Difficulty using hands for taps, buttons, cutlery, and/or writing: Reports  Review of Systems  Constitutional:  Positive for fatigue.  HENT:  Positive for mouth dryness and nose dryness. Negative for mouth sores.   Eyes:  Positive for dryness. Negative for pain and itching.  Respiratory:  Negative for shortness of breath and difficulty  breathing.   Cardiovascular:  Negative for chest pain and palpitations.  Gastrointestinal:  Negative for blood in stool, constipation and diarrhea.  Endocrine: Negative for increased urination.  Genitourinary:  Negative for difficulty urinating.  Musculoskeletal:  Positive for joint pain, joint pain, joint swelling, myalgias, morning stiffness, muscle tenderness and myalgias.  Skin:  Negative for color change, rash and redness.  Allergic/Immunologic: Positive for susceptible to infections.  Neurological:  Positive for headaches. Negative for dizziness, numbness, memory loss and weakness.  Hematological:  Positive for bruising/bleeding tendency.  Psychiatric/Behavioral:  Negative for confusion.    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   Raynaud phenomenon 04/11/2012   ANEMIA, IRON DEFICIENCY 09/08/2009   SJOGREN'S SYNDROME 01/06/2008   Rheumatoid arthritis of multiple sites with negative rheumatoid factor ([place]) 04/09/2007    Past Medical History:  Diagnosis Date   Amnesia memory loss 10/14/2012   Dry eyes    History of glaucoma    s/p  bilateral eye laser treatment  2007   History of traumatic head injury 10/14/2012   Lupus (systemic lupus erythematosus) (HCC)    Positive TB test    pt states cxr negative was tested due to treatment for rheumatoid arthritis since test +  , was told to take medication for tb for 9 months before going back on humira injection   Rheumatoid arthritis(714.0)    Dr.  Dondra Spry, [facility]   Sjogren's disease [facility])    SUI (stress urinary incontinence, female)    Wears glasses     Family History  Problem Relation Age of Onset   Hypertension Mother    CVA Mother        TIA   Cystic fibrosis Mother    Stomach cancer Father    Diabetes Father    Hypertension Father    Hypertension Brother    Hypertension Brother    Healthy Daughter    Healthy Daughter    Past Surgical History:  Procedure Laterality Date   CARPAL TUNNEL RELEASE Bilateral 2007   CERVICAL CONIZATION W/BX  [place]  x2  last one 1999   GLAUCOMA SURGERY Bilateral 2007   laser   HYSTEROSCOPY WITH D & C  03-02-2010   MASS EXCISION Left 05/21/2013   Procedure: EXCISION MASS LEFT LONG FINGER;  Surgeon: Linna Hoff, MD;  Location: [place];  Service: Orthopedics;  Laterality: Left;   SHOULDER ARTHROSCOPY WITH OPEN ROTATOR CUFF REPAIR Right 2012   TENOSYNOVECTOMY Left 05/21/2013   Procedure: LEFT INDEX FINGER TENOSYNOVECTOMY;  Surgeon: Linna Hoff, MD;  Location: [place];  Service: Orthopedics;  Laterality: Left;   TRIGGER FINGER RELEASE  2009   left middle finger   TYMPANOPLASTY Left 1993   Social History   Social History Narrative   Lives with Rise Mu 87yo, Daughter, April 21yo, Pets [place] and [place].    Immunization History  Administered Date(s) Administered   Influenza Split 10/02/2011   Influenza Whole 11/05/2008, 10/24/2009   Influenza, High Dose Seasonal PF 09/23/2014   Influenza,inj,Quad PF,6+ Mos 10/14/2012, 10/15/2016, 09/24/2017, 10/23/2018   Influenza-Unspecified 10/02/2011, 10/14/2012, 11/23/2015, 10/15/2016, 09/24/2017, 10/07/2019   PFIZER(Purple Top)SARS-COV-2 Vaccination 03/16/2019, 04/06/2019, 09/08/2019, 03/10/2020   PPD Test 02/11/2013   Pneumococcal Conjugate-13 07/27/2016   Td 08/06/2002   Tdap 02/06/2019   Zoster Recombinat (Shingrix) 04/13/2020, 06/13/2020     Objective: Vital  Signs: BP 116/83 (BP Location: Left Arm, Patient Position: Sitting, Cuff Size: Normal)   Pulse 91   Ht '5\' 3"'$  (1.6 m)   Wt 157 lb 3.2 oz (71.3 kg)   LMP 05/05/2014 (Approximate)   BMI 27.85 kg/m    Physical Exam Vitals and nursing note reviewed.  Constitutional:      Appearance: She is well-developed.  HENT:     Head: Normocephalic and atraumatic.  Eyes:     Conjunctiva/sclera: Conjunctivae normal.  Pulmonary:     Effort: Pulmonary effort is normal.  Abdominal:     Palpations: Abdomen is soft.  Musculoskeletal:     Cervical back: Normal range of motion.  Skin:    General: Skin is warm and dry.     Capillary Refill: Capillary refill takes less than 2 seconds.  Neurological:     Mental Status: She is alert and oriented to person, place, and time.  Psychiatric:        Behavior: Behavior normal.     Musculoskeletal Exam: C-spine has good range of motion with no  discomfort.  Shoulder joints and elbow joints have good range of motion with no discomfort.  Limited range of motion of both wrist joints and MCP joints.  Tenderness over the ulnar aspect of the right wrist.  Synovial thickening of MCP joints with ulnar deviation, right hand greater than left.  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  Tenderness of the right first MCP joint.  Hip joints have good range of motion with no discomfort.  Knee joints have good range of motion with no warmth or effusion.  Ankle joints have good range of motion with no tenderness or joint swelling. Tenderness at the base of the right 5th metatarsal. Hammertoes noted.   CDAI Exam: CDAI Score: 2.6  Patient Global: 3 mm; Provider Global: 3 mm Swollen: 0 ; Tender: 2  Joint Exam 09/13/2020      Right  Left  Wrist   Tender     MCP 1   Tender        Investigation: No additional findings.  Imaging: DG Chest 2 View  Result Date: 08/28/2020 CLINICAL DATA:  Cough. History of rheumatoid arthritis. COVID-19  negative. Recent infection. EXAM: CHEST - 2 VIEW COMPARISON:  None. FINDINGS: The heart size and mediastinal contours are within normal limits. Both lungs are clear. The visualized skeletal structures are unremarkable. IMPRESSION: No active cardiopulmonary disease. Electronically Signed   By: Dorise Bullion III M.D   On: 08/28/2020 14:26    Recent Labs: Lab Results  Component Value Date   WBC 4.5 08/02/2020   HGB 13.7 08/02/2020   PLT 185 08/02/2020   NA 140 08/02/2020   K 4.0 08/02/2020   CL 104 08/02/2020   CO2 27 08/02/2020   GLUCOSE 108 (H) 08/02/2020   BUN 20 08/02/2020   CREATININE 0.79 08/02/2020   BILITOT 0.3 08/02/2020   ALKPHOS 73 04/11/2012   AST 22 09/02/2020   ALT 22 09/02/2020   PROT 7.1 08/02/2020   ALBUMIN 4.1 04/11/2012   CALCIUM 9.2 08/02/2020   GFRAA 115 03/22/2020   QFTBGOLDPLUS NEGATIVE 04/12/2020    Speciality Comments: No specialty comments available.  Procedures:  No procedures performed Allergies: Cefzil [cefprozil], Macrobid [nitrofurantoin], and Sulfa antibiotics   Assessment / Plan:     Visit Diagnoses: Rheumatoid arthritis involving multiple sites with positive rheumatoid factor ([place]) - +RF,+anti-CCP, +14-3-3n, Diagnosed in 2006: She continues to have intermittent flares involving multiple joints.  She has occasional pain and swelling in her right wrist as well as in both knees.  She is currently taking Xeljanz 11 mg XR daily.  She discontinued methotrexate at the beginning of May 2022 due to not wanting to be on combination therapy which would suppress her immune system more than being on Xeljanz as monotherapy.  She was previously on [place] and methotrexate as combination therapy since 2018.  At her last office visit on 04/12/2020 Dr. Leitha Bleak recommended continuing on combination therapy.  She was strongly encouraged to restart on methotrexate but she declined at this time.  According to the patient she has been having less frequent flares on Xeljanz  as monotherapy.  We will update x-rays of both hands and feet at her follow-up visit to assess for radiographic progression.  She was advised to notify us if she develops increased joint pain or joint swelling.  She will follow-up in the office in 5 months.- Plan: Lipid panel  High risk medication use - Xeljanz 11 mg XR daily.  Previously on  combination therapy with methotrexate 6 tablets by mouth once weekly since 2018.  She discontinued methotrexate and folic acid at the beginning of May 2022.  She declined restarting combination therapy at this time.  CBC and CMP updated on 08/02/20.  LFTs WNL on 09/02/20.  Her next lab work will be due in October and every 3 months.  Standing orders for CBC and CMP are in place. TB gold negative on 04/12/20.   Future order for lipid panel placed today.   - Plan: Lipid panel Discussed the importance of holding Morrie Sheldon if she develops signs or symptoms of an infection and to resume once the infection has completely cleared.  She was diagnosed with COVID-19 on 08/03/2020 and held Morrie Sheldon for 3 weeks during that time.  She does not want to take combination therapy with methotrexate due to the concern for immunosuppression.  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  Chondromalacia of both patellae: She has good ROM of both knee joints with crepitus.  No warmth or effusion of knee joints noted.   Sicca syndrome ([place]): Stable.   Raynaud's phenomenon without gangrene: Not currently active.  No digital ulcers or signs of sclerodactyly noted.   Osteopenia of multiple sites  Mixed hyperlipidemia - Standing orders for lipid panel placed today.  Patient was advised to fast prior to her next lab work in October.  Plan: Lipid panel  Other medical conditions are listed as follows:   Chronic laryngitis  COVID-19 virus infection  Gastroesophageal reflux disease without esophagitis  Mild  episode of recurrent major depressive disorder ([place])  Orders: Orders Placed This Encounter  Procedures   Lipid panel   No orders of the defined types were placed in this encounter.    Follow-Up Instructions: Return in about 5 months (around 02/13/2021) for Rheumatoid arthritis.   Ofilia Neas, PA-C  Note - This record has been created using Dragon software.  Chart creation errors have been sought, but may not always  have been located. Such creation errors do not reflect on  the standard of medical care.

## 2020-09-02 ENCOUNTER — Other Ambulatory Visit: Payer: Self-pay | Admitting: *Deleted

## 2020-09-02 DIAGNOSIS — Z79899 Other long term (current) drug therapy: Secondary | ICD-10-CM

## 2020-09-02 LAB — AST: AST: 22 U/L (ref 10–35)

## 2020-09-02 LAB — ALT: ALT: 22 U/L (ref 6–29)

## 2020-09-04 NOTE — Progress Notes (Signed)
AST and ALT are normal.

## 2020-09-10 ENCOUNTER — Other Ambulatory Visit: Payer: Self-pay | Admitting: Physician Assistant

## 2020-09-12 NOTE — Telephone Encounter (Signed)
Next Visit: 09/13/2020   Last Visit: 04/12/2020   Last Fill: 08/01/2020  [id number] arthritis involving multiple sites with positive rheumatoid factor   Current Dose per office note 04/12/2020: Jenna Gordon 11 mg XR daily   Labs: 08/02/2020 CBC is normal.  Glucose is mildly elevated probably not a fasting sample.  LFTs are elevated.  09/02/2020 AST and ALT are normal.  TB Gold: 04/12/2020 Neg    Okay to refill Jenna Gordon?

## 2020-09-13 ENCOUNTER — Other Ambulatory Visit: Payer: Self-pay

## 2020-09-13 ENCOUNTER — Encounter: Payer: Self-pay | Admitting: Physician Assistant

## 2020-09-13 ENCOUNTER — Ambulatory Visit: Payer: 59 | Admitting: Physician Assistant

## 2020-09-13 VITALS — BP 116/83 | HR 91 | Ht 63.0 in | Wt 157.2 lb

## 2020-09-13 DIAGNOSIS — M0630 Rheumatoid nodule, unspecified site: Secondary | ICD-10-CM | POA: Diagnosis not present

## 2020-09-13 DIAGNOSIS — Z79899 Other long term (current) drug therapy: Secondary | ICD-10-CM

## 2020-09-13 DIAGNOSIS — M3500 Sicca syndrome, unspecified: Secondary | ICD-10-CM

## 2020-09-13 DIAGNOSIS — M0579 Rheumatoid arthritis with rheumatoid factor of multiple sites without organ or systems involvement: Secondary | ICD-10-CM | POA: Diagnosis not present

## 2020-09-13 DIAGNOSIS — E782 Mixed hyperlipidemia: Secondary | ICD-10-CM

## 2020-09-13 DIAGNOSIS — M8589 Other specified disorders of bone density and structure, multiple sites: Secondary | ICD-10-CM

## 2020-09-13 DIAGNOSIS — R0781 Pleurodynia: Secondary | ICD-10-CM | POA: Diagnosis not present

## 2020-09-13 DIAGNOSIS — J370 Chronic laryngitis: Secondary | ICD-10-CM

## 2020-09-13 DIAGNOSIS — K219 Gastro-esophageal reflux disease without esophagitis: Secondary | ICD-10-CM

## 2020-09-13 DIAGNOSIS — M2241 Chondromalacia patellae, right knee: Secondary | ICD-10-CM

## 2020-09-13 DIAGNOSIS — F330 Major depressive disorder, recurrent, mild: Secondary | ICD-10-CM

## 2020-09-13 DIAGNOSIS — I7300 Raynaud's syndrome without gangrene: Secondary | ICD-10-CM

## 2020-09-13 DIAGNOSIS — M2242 Chondromalacia patellae, left knee: Secondary | ICD-10-CM

## 2020-09-13 DIAGNOSIS — U071 COVID-19: Secondary | ICD-10-CM

## 2020-09-13 NOTE — Patient Instructions (Signed)
Standing Labs We placed an order today for your standing lab work.   Please have your standing labs drawn in October and every 3 months   If possible, please have your labs drawn 2 weeks prior to your appointment so that the provider can discuss your results at your appointment.  Please note that you may see your imaging and lab results in MyChart before we have reviewed them. We may be awaiting multiple results to interpret others before contacting you. Please allow our office up to 72 hours to thoroughly review all of the results before contacting the office for clarification of your results.  We have open lab daily: Monday through Thursday from 1:30-4:30 PM and Friday from 1:30-4:00 PM at the office of Dr. Shaili Deveshwar, [facility].   Please be advised, all patients with office appointments requiring lab work will take precedent over walk-in lab work.  If possible, please come for your lab work on Monday and Friday afternoons, as you may experience shorter wait times. The office is located at [address], [address], [place], [place] [address] No appointment is necessary.   Labs are drawn by [facility]. Please bring your co-pay at the time of your lab draw.  You may receive a bill from [facility] for your lab work.  If you wish to have your labs drawn at another location, please call the office 24 hours in advance to send orders.  If you have any questions regarding directions or hours of operation,  please call [phone].   As a reminder, please drink plenty of water prior to coming for your lab work. Thanks!  

## 2020-09-24 ENCOUNTER — Other Ambulatory Visit: Payer: Self-pay | Admitting: Family Medicine

## 2020-09-28 ENCOUNTER — Encounter: Payer: Self-pay | Admitting: Family Medicine

## 2020-09-28 NOTE — Telephone Encounter (Signed)
Sent patient a message in mychart to clarify if she's still taking this medication.

## 2020-09-30 NOTE — Telephone Encounter (Signed)
Red team, can you ask patient if she is still taking this? She has not read or responded to the [employer] I sent her. Thanks! Leeanne Rio, MD

## 2020-09-30 NOTE — Telephone Encounter (Signed)
Spoke to patient while she was in clinic.    Patient states that she is not using Protonix but her mother is .  There was a misunderstanding because she requested it for her mother who lives with her.  Will forward request to her mother's PCP.  Jenna Gordon, [place]

## 2020-11-16 ENCOUNTER — Other Ambulatory Visit: Payer: Self-pay | Admitting: Family Medicine

## 2020-12-06 ENCOUNTER — Other Ambulatory Visit (HOSPITAL_COMMUNITY)
Admission: RE | Admit: 2020-12-06 | Discharge: 2020-12-06 | Disposition: A | Discharge status: Home / Self Care | Payer: 59 | Source: Ambulatory Visit | Attending: Family Medicine | Admitting: Family Medicine

## 2020-12-06 ENCOUNTER — Encounter: Payer: Self-pay | Admitting: Family Medicine

## 2020-12-06 ENCOUNTER — Ambulatory Visit: Payer: 59 | Admitting: Family Medicine

## 2020-12-06 ENCOUNTER — Ambulatory Visit (INDEPENDENT_AMBULATORY_CARE_PROVIDER_SITE_OTHER): Payer: 59

## 2020-12-06 ENCOUNTER — Other Ambulatory Visit: Payer: Self-pay

## 2020-12-06 VITALS — BP 112/80 | HR 84 | Temp 98.2°F | Wt 163.0 lb

## 2020-12-06 DIAGNOSIS — Z23 Encounter for immunization: Secondary | ICD-10-CM

## 2020-12-06 DIAGNOSIS — Z113 Encounter for screening for infections with a predominantly sexual mode of transmission: Secondary | ICD-10-CM | POA: Insufficient documentation

## 2020-12-06 DIAGNOSIS — F339 Major depressive disorder, recurrent, unspecified: Secondary | ICD-10-CM

## 2020-12-06 DIAGNOSIS — N393 Stress incontinence (female) (male): Secondary | ICD-10-CM

## 2020-12-06 DIAGNOSIS — B977 Papillomavirus as the cause of diseases classified elsewhere: Secondary | ICD-10-CM

## 2020-12-06 MED ORDER — SERTRALINE HCL 100 MG PO TABS: 150.0000 mg | ORAL_TABLET | Freq: Every day | ORAL | 3 refills | Status: DC

## 2020-12-06 NOTE — Patient Instructions (Signed)
It was great to see you again today!  Pap smear done today Checking labs for STD screening  Referring to urogynecologist  Suspect cough will keep getting better  COVID booster and pneumonia vaccine today  Follow up with me in 1 year, sooner if needed  Be well, Dr. Ardelia Mems

## 2020-12-06 NOTE — Progress Notes (Signed)
  Date of Visit: 12/06/2020   SUBJECTIVE:   HPI:  Jenna Gordon presents today for pap smear.  Pap - due for follow up pap, had normal cytology but pos HPV last year. Desires STD screening; has had one female partner recently.  Depression - taking sertraline 150mg  daily. Tolerating well. Mood is doing great. Needs refill today.  Cough - since Wednesday of last week has had cough, also associated with loose stools, headache. No fevers. Has noted some stress incontinence that is really bothering her; having to wear a pad. Would like to see urogyn. Overall cough is improving. She took a COVID PCR test on Thursday of last week which was negative, as was flu A/B.  OBJECTIVE:   BP 112/80   Pulse 84   Temp 98.2 F (36.8 C)   Wt 163 lb (73.9 kg)   LMP 05/05/2014 (Approximate)   SpO2 97%   BMI 28.87 kg/m  Gen: no acute distress, pleasant, cooperative HEENT: normocephalic, atraumatic  Heart: regular rate and rhythm, no murmur Lungs: clear to auscultation bilaterally, normal work of breathing. No crackles heard. Occasional dry cough. Neuro: alert, speech normal, grossly nonfocal GU: normal appearing external genitalia without lesions. Vagina atrophic. Cervix with scarring from prior conization. Os is very small due to scarring. No cervical motion tenderness or tenderness on bimanual exam. No adnexal masses. Chaperone present: Jenna Gordon, CMA   ASSESSMENT/PLAN:   Health maintenance:  -bivalent COVID booster given today -pneumovax 23 given today (had PCV 13 back in 2018) -STD screening - gc/chl/trich, HIV, RPR today  Depression, recurrent ([place]) Doing very well on current zoloft regimen Refills provided Given stability can follow up in 1 year, sooner if needed  High risk HPV infection Pap with HPV cotest today. Will reflex to HPV 16/18 if positive.  Stress incontinence With coughing, very bothersome to patient. Patient desires referral to urogyn, referral placed.  Cough - well  appearing with normal O2 sat and clear lungs on exam. Suspect viral process. Cough is overall improving. Continue to monitor; no need for antibiotics at this time.  FOLLOW UP: Follow up in 1 year for routine visit, sooner if needed  Jenna Gordon, [place]

## 2020-12-06 NOTE — Assessment & Plan Note (Signed)
Doing very well on current zoloft regimen Refills provided Given stability can follow up in 1 year, sooner if needed

## 2020-12-06 NOTE — Assessment & Plan Note (Signed)
Pap with HPV cotest today. Will reflex to HPV 16/18 if positive.

## 2020-12-06 NOTE — Assessment & Plan Note (Signed)
With coughing, very bothersome to patient. Patient desires referral to urogyn, referral placed.

## 2020-12-07 LAB — HIV ANTIBODY (ROUTINE TESTING W REFLEX): HIV Screen 4th Generation wRfx: NONREACTIVE

## 2020-12-07 LAB — RPR: RPR Ser Ql: NONREACTIVE

## 2020-12-14 ENCOUNTER — Telehealth: Payer: Self-pay | Admitting: Family Medicine

## 2020-12-14 DIAGNOSIS — R87618 Other abnormal cytological findings on specimens from cervix uteri: Secondary | ICD-10-CM

## 2020-12-14 LAB — CYTOLOGY - PAP
Chlamydia: NEGATIVE
Comment: NEGATIVE
Comment: NEGATIVE
Comment: NEGATIVE
Comment: NEGATIVE
Comment: NORMAL
HPV 16: NEGATIVE
HPV 18 / 45: NEGATIVE
High risk HPV: POSITIVE — AB
Neisseria Gonorrhea: NEGATIVE
Trichomonas: NEGATIVE

## 2020-12-14 NOTE — Telephone Encounter (Signed)
Called patient to discuss pap results. Pap shows HPV pos (but negative for 16/18/45) and "atrophic pattern with epithelial atypia."   Recommend patient see GYN for further management and likely colposcopy given her history of cervical conization. Patient is agreeable and requests referral to Dr. Micah Noel in [facility].  Referral entered. Patient appreciative.  Leeanne Rio, MD

## 2020-12-20 ENCOUNTER — Other Ambulatory Visit: Payer: Self-pay | Admitting: Rheumatology

## 2020-12-21 NOTE — Telephone Encounter (Signed)
Per office note on 09/13/2020 She discontinued methotrexate and folic acid at the beginning of May 2022.  She declined restarting combination therapy at this time

## 2021-01-04 ENCOUNTER — Ambulatory Visit: Payer: 59 | Admitting: Obstetrics and Gynecology

## 2021-01-04 ENCOUNTER — Encounter: Payer: Self-pay | Admitting: Obstetrics and Gynecology

## 2021-01-04 ENCOUNTER — Telehealth: Payer: Self-pay | Admitting: Pharmacist

## 2021-01-04 ENCOUNTER — Other Ambulatory Visit: Payer: Self-pay

## 2021-01-04 ENCOUNTER — Other Ambulatory Visit: Payer: Self-pay | Admitting: *Deleted

## 2021-01-04 VITALS — BP 120/80 | HR 73 | Wt 157.0 lb

## 2021-01-04 DIAGNOSIS — Z9225 Personal history of immunosupression therapy: Secondary | ICD-10-CM

## 2021-01-04 DIAGNOSIS — N3281 Overactive bladder: Secondary | ICD-10-CM | POA: Diagnosis not present

## 2021-01-04 DIAGNOSIS — R350 Frequency of micturition: Secondary | ICD-10-CM

## 2021-01-04 DIAGNOSIS — N393 Stress incontinence (female) (male): Secondary | ICD-10-CM

## 2021-01-04 DIAGNOSIS — Z79899 Other long term (current) drug therapy: Secondary | ICD-10-CM

## 2021-01-04 DIAGNOSIS — Z111 Encounter for screening for respiratory tuberculosis: Secondary | ICD-10-CM

## 2021-01-04 LAB — POCT URINALYSIS DIPSTICK
Appearance: NORMAL
Bilirubin, UA: NEGATIVE
Blood, UA: NEGATIVE
Glucose, UA: NEGATIVE
Ketones, UA: NEGATIVE
Leukocytes, UA: NEGATIVE
Nitrite, UA: NEGATIVE
Protein, UA: NEGATIVE
Spec Grav, UA: 1.01 (ref 1.010–1.025)
Urobilinogen, UA: 0.2 E.U./dL
pH, UA: 7 (ref 5.0–8.0)

## 2021-01-04 MED ORDER — SOLIFENACIN SUCCINATE 5 MG PO TABS: 5.0000 mg | ORAL_TABLET | Freq: Every day | ORAL | 5 refills | Status: DC

## 2021-01-04 MED ORDER — MIRABEGRON ER 25 MG PO TB24
25.0000 mg | ORAL_TABLET | Freq: Every day | ORAL | 5 refills | Status: DC
Start: 2021-01-04 — End: 2021-01-27

## 2021-01-04 NOTE — Telephone Encounter (Signed)
PA renewal started via CMM in separate telephone encounter  Knox Saliva, PharmD, MPH, BCPS Clinical Pharmacist (Rheumatology and Pulmonology)

## 2021-01-04 NOTE — Telephone Encounter (Signed)
Patient called requesting prescription refill of Methotrexate.  Patient states she had discontinued the medication, but after her appointment with Lovena Le in August started taking it again due to having a lot of flair-ups.    Patient also has questions regarding the prior authorization for her Morrie Sheldon medication and requested a return call.

## 2021-01-04 NOTE — Telephone Encounter (Addendum)
Received call from patient that her Morrie Sheldon needs PA renewal. Original renewal request was sent to previous rheumatology clinic so there was delay in our clinic being notified.  Submitted a Prior Authorization RENEWAL request to [facility] for [facility] via CoverMyMeds. Will update once we receive a response.  Key: [id number]  Knox Saliva, PharmD, MPH, BCPS Clinical Pharmacist (Rheumatology and Pulmonology)
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
## 2021-01-04 NOTE — Telephone Encounter (Signed)
Please clarify if she would like [place] to wait to refill MTX until we can review updated lab work to determine the appropriate dose of MTX.

## 2021-01-04 NOTE — Telephone Encounter (Signed)
Next Visit: 02/15/2021  Last Visit: 09/13/2020  Last Fill: 04/12/2020  DX: Rheumatoid arthritis involving multiple sites with positive rheumatoid factor  Current Dose per office note 09/13/2020: methotrexate 6 tablets by mouth once weekly   Labs: 08/02/2020  CBC is normal.  Glucose is mildly elevated probably not a fasting sample.  LFTs are elevated.  Please advise patient to decrease methotrexate to 4 tablets p.o. weekly.  Patient advised she is due to update labs. Patient states she will come today to update labs.   Okay to refill MTX?

## 2021-01-05 LAB — COMPLETE METABOLIC PANEL WITH GFR
AG Ratio: 1.5 (calc) (ref 1.0–2.5)
ALT: 29 U/L (ref 6–29)
AST: 31 U/L (ref 10–35)
Albumin: 4.4 g/dL (ref 3.6–5.1)
Alkaline phosphatase (APISO): 83 U/L (ref 37–153)
BUN: 16 mg/dL (ref 7–25)
CO2: 26 mmol/L (ref 20–32)
Calcium: 9.1 mg/dL (ref 8.6–10.4)
Chloride: 104 mmol/L (ref 98–110)
Creat: 0.69 mg/dL (ref 0.50–1.03)
Globulin: 2.9 g/dL (calc) (ref 1.9–3.7)
Glucose, Bld: 95 mg/dL (ref 65–99)
Potassium: 4.6 mmol/L (ref 3.5–5.3)
Sodium: 138 mmol/L (ref 135–146)
Total Bilirubin: 0.4 mg/dL (ref 0.2–1.2)
Total Protein: 7.3 g/dL (ref 6.1–8.1)
eGFR: 102 mL/min/{1.73_m2} (ref >=60)

## 2021-01-05 LAB — CBC WITH DIFFERENTIAL/PLATELET
Absolute Monocytes: 570 cells/uL (ref 200–950)
Basophils Absolute: 30 cells/uL (ref 0–200)
Basophils Relative: 0.5 %
Eosinophils Absolute: 192 cells/uL (ref 15–500)
Eosinophils Relative: 3.2 %
HCT: 43 % (ref 35.0–45.0)
Hemoglobin: 14.3 g/dL (ref 11.7–15.5)
Lymphs Abs: 1314 cells/uL (ref 850–3900)
MCH: 31.3 pg (ref 27.0–33.0)
MCHC: 33.3 g/dL (ref 32.0–36.0)
MCV: 94.1 fL (ref 80.0–100.0)
MPV: 10.5 fL (ref 7.5–12.5)
Monocytes Relative: 9.5 %
Neutro Abs: 3894 cells/uL (ref 1500–7800)
Neutrophils Relative %: 64.9 %
Platelets: 205 10*3/uL (ref 140–400)
RBC: 4.57 10*6/uL (ref 3.80–5.10)
RDW: 14.3 % (ref 11.0–15.0)
Total Lymphocyte: 21.9 %
WBC: 6 10*3/uL (ref 3.8–10.8)

## 2021-01-05 MED ORDER — METHOTREXATE 2.5 MG PO TABS: 10.0000 mg | ORAL_TABLET | ORAL | 0 refills | Status: DC

## 2021-01-05 NOTE — Telephone Encounter (Signed)
Received fax from [facility] for additional clinical questions and chart noted. Faxed to [facility]  Fax: [phone] Phone: [phone], option 2 Ref: [id number]  Knox Saliva, PharmD, MPH, BCPS Clinical Pharmacist (Rheumatology and Pulmonology)

## 2021-01-05 NOTE — Telephone Encounter (Signed)
Ok to refill methotrexate 4 tablets weekly and folic acid 1 mg daily.

## 2021-01-05 NOTE — Telephone Encounter (Signed)
Patient updated labs on 01/04/2021. Labs are WNL.   What dose would you like to refill MTX for?

## 2021-01-05 NOTE — Telephone Encounter (Signed)
Patient advised.

## 2021-01-08 ENCOUNTER — Other Ambulatory Visit: Payer: Self-pay | Admitting: Physician Assistant

## 2021-01-09 NOTE — Telephone Encounter (Signed)
Next Visit: 02/15/2021  Last Visit: 09/13/2020  Last Fill: 09/12/2020  QM:VHQIONGEXB arthritis involving multiple sites with positive rheumatoid factor  Current Dose per office note 09/13/2020: Morrie Sheldon 11 mg XR daily  Labs: 01/04/2021 CBC and CMP WNL  TB Gold: 04/12/2020   Okay to refill Morrie Sheldon?

## 2021-01-09 NOTE — Telephone Encounter (Signed)
Received notification from [facility] regarding a prior authorization for [facility]. Authorization has been APPROVED from 01/04/21 to 01/04/22.   Patient can continue to fill through [place] [facility]: [phone]   Authorization # [id number]  Knox Saliva, PharmD, MPH, BCPS Clinical Pharmacist (Rheumatology and Pulmonology)

## 2021-01-18 ENCOUNTER — Telehealth: Payer: Self-pay

## 2021-01-18 NOTE — Telephone Encounter (Signed)
Medication Samples have been provided to the patient.  Drug name: Morrie Sheldon       Strength: 11mg         Qty: 2 bottles  LOT: [id number], [id number]  Exp.Date: 02/21/2021, 03/21/2021  Dosing instructions: Take 1 tablet by mouth daily.    Patient is aware of expiration dates and will take medication prior to expiring.

## 2021-01-20 ENCOUNTER — Other Ambulatory Visit: Payer: Self-pay | Admitting: Rheumatology

## 2021-01-20 NOTE — Telephone Encounter (Signed)
Next Visit: 02/15/2021   Last Visit: 09/13/2020   Last Fill: 01/12/2020  PR:[id number] arthritis involving multiple sites with positive rheumatoid factor   Current Dose per phone note 48/34/7583:  folic acid 1 mg daily  Okay to refill Folic Acid?

## 2021-01-26 ENCOUNTER — Telehealth: Payer: Self-pay | Admitting: Obstetrics and Gynecology

## 2021-01-27 MED ORDER — OXYBUTYNIN CHLORIDE 5 MG PO TABS: 5.0000 mg | ORAL_TABLET | Freq: Two times a day (BID) | ORAL | 3 refills | Status: DC

## 2021-01-27 NOTE — Telephone Encounter (Signed)
Oxybutynin 5mg  has been sent to the pharmacy.

## 2021-02-01 NOTE — Progress Notes (Signed)
Office Visit Note  Patient: Jenna Gordon             Date of Birth: 10-22-1965           MRN: [id number]             PCP: Leeanne Rio, MD Referring: Leeanne Rio, MD Visit Date: 02/15/2021 Occupation: @GUAROCC @  Subjective:  Medication management  History of Present Illness: Jenna Gordon is a 56 y.o. female with history of rheumatoid arthritis and osteoarthritis.  She states she has been doing well on the combination of Xeljanz and methotrexate.  She has not noticed any increased swelling on the combination therapy.  She also noticed that the rheumatoid nodules have resolved.  She has some discomfort and stiffness in her joints due to underlying arthritis.  She has not had any recent problems with Raynaud's phenomenon.  She continues to have sicca symptoms.  Activities of Daily Living:  Patient reports morning stiffness for 1.5-2 hours.   Patient Denies nocturnal pain.  Difficulty dressing/grooming: Denies Difficulty climbing stairs: Reports Difficulty getting out of chair: Reports Difficulty using hands for taps, buttons, cutlery, and/or writing: Reports  Review of Systems  Constitutional:  Positive for fatigue.  HENT:  Positive for mouth dryness and nose dryness. Negative for mouth sores.   Eyes:  Positive for dryness. Negative for pain and itching.  Respiratory:  Negative for shortness of breath and difficulty breathing.   Cardiovascular:  Negative for chest pain and palpitations.  Gastrointestinal:  Negative for blood in stool, constipation and diarrhea.  Endocrine: Negative for increased urination.  Genitourinary:  Negative for difficulty urinating.  Musculoskeletal:  Positive for morning stiffness. Negative for joint pain, joint pain, joint swelling, myalgias, muscle tenderness and myalgias.  Skin:  Negative for color change, rash, redness and sensitivity to sunlight.  Allergic/Immunologic: Positive for susceptible to infections.  Neurological:  Positive  for memory loss. Negative for dizziness, numbness, headaches and weakness.  Hematological:  Positive for bruising/bleeding tendency.  Psychiatric/Behavioral:  Positive for depressed mood. Negative for confusion and sleep disturbance. The patient is not nervous/anxious.    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   Raynaud phenomenon 04/11/2012   ANEMIA, IRON DEFICIENCY 09/08/2009   SJOGREN'S SYNDROME 01/06/2008   Rheumatoid arthritis of multiple sites with negative rheumatoid factor ([place]) 04/09/2007    Past Medical History:  Diagnosis Date   Amnesia memory loss 10/14/2012   Dry eyes    History of glaucoma    s/p   bilateral eye laser treatment  2007   History of traumatic head injury 10/14/2012   Lupus (systemic lupus erythematosus) (HCC)    Positive TB test    pt states cxr negative was tested due to treatment for rheumatoid arthritis since test +  , was told to take medication for tb for 9 months before going back on humira injection   Rheumatoid arthritis(714.0)    Dr. Dondra Spry, [facility]   Sjogren's disease [facility])  SUI (stress urinary incontinence, female)    Wears glasses     Family History  Problem Relation Age of Onset   CVA Mother        TIA   Hypertension Mother    Cystic fibrosis Mother    Stomach cancer Father    Diabetes Father    Hypertension Father    Hypertension Brother    Hypertension Brother    Healthy Daughter    Healthy  Daughter    Past Surgical History:  Procedure Laterality Date   CARPAL TUNNEL RELEASE Bilateral 2007   CERVICAL CONIZATION W/BX  [place]  x2  last one 1999   GLAUCOMA SURGERY Bilateral 2007   laser   HYSTEROSCOPY WITH D & C  03-02-2010   MASS EXCISION Left 05/21/2013   Procedure: EXCISION MASS LEFT LONG FINGER;  Surgeon: Linna Hoff, MD;  Location: [place];  Service: Orthopedics;  Laterality: Left;   SHOULDER ARTHROSCOPY WITH OPEN ROTATOR CUFF REPAIR Right 2012   TENOSYNOVECTOMY Left 05/21/2013   Procedure: LEFT INDEX FINGER TENOSYNOVECTOMY;  Surgeon: Linna Hoff, MD;  Location: [place];  Service: Orthopedics;  Laterality: Left;   TRIGGER FINGER RELEASE  2009   left middle finger   TYMPANOPLASTY Left 1993   Social History   Social History Narrative   Lives with Rise Mu 87yo, Daughter, April 21yo, Pets [place] and [place].    Immunization History  Administered Date(s) Administered   Influenza Split 10/02/2011   Influenza Whole 11/05/2008, 10/24/2009   Influenza, High Dose Seasonal PF 09/23/2014   Influenza,inj,Quad PF,6+ Mos 10/14/2012, 10/15/2016, 09/24/2017, 10/23/2018   Influenza-Unspecified 10/02/2011, 10/14/2012, 11/23/2015, 10/15/2016, 09/24/2017, 10/07/2019   PFIZER(Purple Top)SARS-COV-2 Vaccination 03/16/2019, 04/06/2019, 09/08/2019, 03/10/2020   PPD Test 02/11/2013   Pfizer Covid-19 Vaccine Bivalent Booster 35yrs & up 12/06/2020   Pneumococcal Conjugate-13 07/27/2016   Pneumococcal Polysaccharide-23 12/06/2020   Td 08/06/2002   Tdap 02/06/2019   Zoster Recombinat (Shingrix) 04/13/2020, 06/13/2020     Objective: Vital Signs: BP 111/72 (BP Location: Right Arm, Patient Position: Sitting, Cuff Size: Normal)    Pulse 66    Resp 16    Ht 5\' 3"  (1.6 m)    Wt 164 lb 3.2 oz (74.5 kg)    LMP 05/05/2014 (Approximate)    BMI 29.09 kg/m    Physical Exam Vitals and nursing note reviewed.  Constitutional:       Appearance: She is well-developed.  HENT:     Head: Normocephalic and atraumatic.  Eyes:     Conjunctiva/sclera: Conjunctivae normal.  Cardiovascular:     Rate and Rhythm: Normal rate and regular rhythm.     Heart sounds: Normal heart sounds.  Pulmonary:     Effort: Pulmonary effort is normal.     Breath sounds: Normal breath sounds.  Abdominal:     General: Bowel sounds are normal.     Palpations: Abdomen is soft.  Musculoskeletal:     Cervical back: Normal range of motion.  Lymphadenopathy:     Cervical: No cervical adenopathy.  Skin:    General: Skin is warm and dry.     Capillary Refill: Capillary refill takes less than 2 seconds.  Neurological:     Mental Status: She is alert and oriented to person, place, and time.  Psychiatric:        Behavior: Behavior normal.     Musculoskeletal Exam: C-spine was in good range of motion.  Shoulder joints and elbow joints with  good range of motion.  She had some limitation range of motion of wrist joints.  She had MCP thickening and ulnar deviation.  PIP and DIP thickening was also noted.  She good range of motion of her hip joints and knee joints.  There was no tenderness over ankles or MTPs.  CDAI Exam: CDAI Score: -- Patient Global: --; Provider Global: -- Swollen: --; Tender: -- Joint Exam 02/15/2021   No joint exam has been documented for this visit   There is currently no information documented on the homunculus. Go to the Rheumatology activity and complete the homunculus joint exam.  Investigation: No additional findings.  Imaging: No results found.  Recent Labs: Lab Results  Component Value Date   WBC 6.0 01/04/2021   HGB 14.3 01/04/2021   PLT 205 01/04/2021   NA 138 01/04/2021   K 4.6 01/04/2021   CL 104 01/04/2021   CO2 26 01/04/2021   GLUCOSE 95 01/04/2021   BUN 16 01/04/2021   CREATININE 0.69 01/04/2021   BILITOT 0.4 01/04/2021   ALKPHOS 73 04/11/2012   AST 31 01/04/2021   ALT 29 01/04/2021   PROT 7.3  01/04/2021   ALBUMIN 4.1 04/11/2012   CALCIUM 9.1 01/04/2021   GFRAA 115 03/22/2020   QFTBGOLDPLUS NEGATIVE 04/12/2020    Speciality Comments: No specialty comments available.  Procedures:  No procedures performed Allergies: Cefzil [cefprozil], Macrobid [nitrofurantoin], and Sulfa antibiotics   Assessment / Plan:     Visit Diagnoses: Rheumatoid arthritis involving multiple sites with positive rheumatoid factor ([place]) - +RF,+anti-CCP, +14-3-3n, Diagnosed in 2006: She continues to have some joint pain and stiffness due to underlying arthritis.  She had no active synovitis.  She has noticed improvement on the combination of Xeljanz and methotrexate.  High risk medication use - Xeljanz 11 mg XR daily, methotrexate 4 tablets by mouth once weekly and folic acid 1mg  daily.  Labs obtained on January 04, 2021 were within normal limits.  TB gold was negative on April 12, 2020.  I advised her to come back for labs in March.  We will get TB Gold and also fasting lipid panel with March labs. MACE black box warning with Sultan's was revised.  Increase water intake and regular exercise was emphasized.  She is also advised to take baby aspirin if she goes for long trips.  Information regarding ministration was placed in the AVS.  She was advised to hold Xeljanz and methotrexate in case she develops an infection and resume after the infection resolves.  Rheumatoid nodulosis (HCC)-resolved.  Sicca syndrome (HCC)-she continues to have dry mouth and dry eyes.  Over-the-counter products were discussed.  Raynaud's phenomenon without gangrene-normal nailbed capillary changes were noted.  No sclerodactyly or telangiectasias were noted.  No digital ulcers were noted.  Chondromalacia of both patellae-lower extremity muscle strengthening exercises were discussed.  Osteopenia of multiple -she gets DEXA scan through her PCP.  Chronic laryngitis-she continues to have laryngitis.  Gastroesophageal reflux disease  without esophagitis  Mixed hyperlipidemia-she is currently not taking any medications.  Increased risk of heart disease with rheumatoid arthritis was discussed.  Dietary modifications and exercise was emphasized.  She has been going to the gym on a regular basis.  Mild episode of recurrent major depressive disorder (HCC)-her symptoms are controlled on Zoloft.  Orders: No orders of the defined types were placed in this encounter.  No orders of the defined types were placed in this encounter.   Follow-Up Instructions: Return in about 5 months (around 07/16/2021)  for Rheumatoid arthritis.   Bo Merino, MD  Note - This record has been created using Editor, commissioning.  Chart creation errors have been sought, but may not always  have been located. Such creation errors do not reflect on  the standard of medical care.

## 2021-02-15 ENCOUNTER — Other Ambulatory Visit: Payer: Self-pay

## 2021-02-15 ENCOUNTER — Ambulatory Visit: Payer: 59 | Admitting: Rheumatology

## 2021-02-15 ENCOUNTER — Encounter: Payer: Self-pay | Admitting: Rheumatology

## 2021-02-15 VITALS — BP 111/72 | HR 66 | Resp 16 | Ht 63.0 in | Wt 164.2 lb

## 2021-02-15 DIAGNOSIS — Z79899 Other long term (current) drug therapy: Secondary | ICD-10-CM

## 2021-02-15 DIAGNOSIS — M2241 Chondromalacia patellae, right knee: Secondary | ICD-10-CM

## 2021-02-15 DIAGNOSIS — I7300 Raynaud's syndrome without gangrene: Secondary | ICD-10-CM

## 2021-02-15 DIAGNOSIS — M0630 Rheumatoid nodule, unspecified site: Secondary | ICD-10-CM

## 2021-02-15 DIAGNOSIS — K219 Gastro-esophageal reflux disease without esophagitis: Secondary | ICD-10-CM

## 2021-02-15 DIAGNOSIS — M3500 Sicca syndrome, unspecified: Secondary | ICD-10-CM | POA: Diagnosis not present

## 2021-02-15 DIAGNOSIS — M0579 Rheumatoid arthritis with rheumatoid factor of multiple sites without organ or systems involvement: Secondary | ICD-10-CM | POA: Diagnosis not present

## 2021-02-15 DIAGNOSIS — M8589 Other specified disorders of bone density and structure, multiple sites: Secondary | ICD-10-CM

## 2021-02-15 DIAGNOSIS — E782 Mixed hyperlipidemia: Secondary | ICD-10-CM

## 2021-02-15 DIAGNOSIS — R0781 Pleurodynia: Secondary | ICD-10-CM

## 2021-02-15 DIAGNOSIS — M2242 Chondromalacia patellae, left knee: Secondary | ICD-10-CM

## 2021-02-15 DIAGNOSIS — U071 COVID-19: Secondary | ICD-10-CM

## 2021-02-15 DIAGNOSIS — J370 Chronic laryngitis: Secondary | ICD-10-CM

## 2021-02-15 DIAGNOSIS — F330 Major depressive disorder, recurrent, mild: Secondary | ICD-10-CM

## 2021-02-15 NOTE — Patient Instructions (Addendum)
Standing Labs We placed an order today for your standing lab work.   Please have your standing labs drawn in March and every 3 months  TB Gold and fasting lipid panel are due in March  If possible, please have your labs drawn 2 weeks prior to your appointment so that the provider can discuss your results at your appointment.  Please note that you may see your imaging and lab results in [facility] before we have reviewed them. We may be awaiting multiple results to interpret others before contacting you. Please allow our office up to 72 hours to thoroughly review all of the results before contacting the office for clarification of your results.  We have open lab daily: Monday through Thursday from 1:30-4:30 PM and Friday from 1:30-4:00 PM at the office of Dr. Bo Merino, [facility].   Please be advised, all patients with office appointments requiring lab work will take precedent over walk-in lab work.  If possible, please come for your lab work on Monday and Friday afternoons, as you may experience shorter wait times. The office is located at [address], [place], [place], [place] [address] No appointment is necessary.   Labs are drawn by [facility]. Please bring your co-pay at the time of your lab draw.  You may receive a bill from [facility] for your lab work.  Please note if you are on Hydroxychloroquine and and an order has been placed for a Hydroxychloroquine level, you will need to have it drawn 4 hours or more after your last dose.  If you wish to have your labs drawn at another location, please call the office 24 hours in advance to send orders.  If you have any questions regarding directions or hours of operation,  please call [phone].   As a reminder, please drink plenty of water prior to coming for your lab work. Thanks!   Vaccines You are taking a medication(s) that can suppress your immune system.  The following immunizations are recommended: Flu  annually Covid-19  Td/Tdap (tetanus, diphtheria, pertussis) every 10 years Pneumonia (Prevnar 15 then Pneumovax 23 at least 1 year apart.  Alternatively, can take Prevnar 20 without needing additional dose) Shingrix: 2 doses from 4 weeks to 6 months apart  Please check with your PCP to make sure you are up to date.   If you have signs or symptoms of an infection or start antibiotics: First, call your PCP for workup of your infection. Hold your medication through the infection, until you complete your antibiotics, and until symptoms resolve if you take the following: Injectable medication (Actemra, Benlysta, Cimzia, Cosentyx, Enbrel, Humira, Kevzara, Orencia, Remicade, Simponi, Stelara, Taltz, Tremfya) Methotrexate Leflunomide (Arava) Mycophenolate (Cellcept) Morrie Sheldon, Olumiant, or Rinvoq  Heart Disease Prevention   Your inflammatory disease increases your risk of heart disease which includes heart attack, stroke, atrial fibrillation (irregular heartbeats), high blood pressure, heart failure and atherosclerosis (plaque in the arteries).  It is important to reduce your risk by:   Keep blood pressure, cholesterol, and blood sugar at healthy levels   Smoking Cessation   Maintain a healthy weight  BMI 20-25   Eat a healthy diet  Plenty of fresh fruit, vegetables, and whole grains  Limit saturated fats, foods high in sodium, and added sugars  DASH and Mediterranean diet   Increase physical activity  Recommend moderate physically activity for 150 minutes per week/ 30 minutes a day for five days a week These can be broken up into three separate ten-minute  sessions during the day.   Reduce Stress  Meditation, slow breathing exercises, yoga, coloring books  Dental visits twice a year

## 2021-02-17 ENCOUNTER — Ambulatory Visit: Payer: 59 | Admitting: Obstetrics and Gynecology

## 2021-03-06 ENCOUNTER — Other Ambulatory Visit: Payer: Self-pay

## 2021-03-06 ENCOUNTER — Encounter: Payer: Self-pay | Admitting: Pulmonary Disease

## 2021-03-06 ENCOUNTER — Ambulatory Visit: Payer: 59 | Admitting: Pulmonary Disease

## 2021-03-06 VITALS — BP 132/82 | HR 65 | Temp 98.2°F | Ht 63.0 in | Wt 160.8 lb

## 2021-03-06 DIAGNOSIS — R490 Dysphonia: Secondary | ICD-10-CM | POA: Insufficient documentation

## 2021-03-06 DIAGNOSIS — J42 Unspecified chronic bronchitis: Secondary | ICD-10-CM | POA: Diagnosis not present

## 2021-03-06 MED ORDER — ALBUTEROL SULFATE HFA 108 (90 BASE) MCG/ACT IN AERS: 2.0000 | INHALATION_SPRAY | Freq: Four times a day (QID) | RESPIRATORY_TRACT | 2 refills | Status: DC | PRN

## 2021-03-06 NOTE — Progress Notes (Signed)
Subjective:   PATIENT ID: Jenna Gordon GENDER: female DOB: 12/10/1965, MRN: [id number]   HPI  Chief Complaint  Patient presents with   Consult    Fleam     Reason for Visit: New consult for productive cough  Ms. Jenna Gordon is a 56 year old female never smoker with RA on methotrexate and Xelijanz, OA, Raynauds, hx TB exposure s/p treatment who presents as a new consult for productive cough.  She reports long standing sputum production for 6-7 years that occurs throughout the day that does not improve. Sputum is thick and white. It worsens with illness and after she had COVID in Jan 2021 and July 2022. Sometimes associated with shortness of breath. Denies wheezing except when she was ill. Denies recurrent respiratory infections during childhood. She has a long history of hoarseness that has not been worked up. Denies nasal congestion. She reports reflux on pepcid with breakthrough symptoms. She was seen by ENT last year and recommended injections for thicker vocal cords however has not proceeded as she feels used to her hoarseness.  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  Past Medical History:  Diagnosis Date   Amnesia memory loss 10/14/2012   Dry eyes    History of glaucoma    s/p   bilateral eye laser treatment  2007   History of traumatic head injury 10/14/2012   Lupus (systemic lupus erythematosus) (HCC)    Positive TB test    pt states cxr negative was tested due to treatment for rheumatoid arthritis since test +  , was told to take medication for tb for 9 months before going back on humira injection   Rheumatoid arthritis(714.0)    Dr.  Dondra Spry, [facility]   Sjogren's disease [facility])    SUI (stress urinary incontinence, female)    Wears glasses      Family History  Problem Relation Age of Onset   CVA Mother        TIA   Hypertension Mother    Cystic fibrosis Mother    Stomach cancer Father    Diabetes Father    Hypertension Father    Hypertension Brother    Hypertension Brother    Healthy Daughter    Healthy Daughter      Social History   Occupational History   Not on file  Tobacco Use   Smoking status: Never   Smokeless tobacco: Never  Vaping Use   Vaping Use: Never used  Substance and Sexual Activity   Alcohol use: Yes    Comment: wine occ   Drug use: No   Sexual activity: Not Currently    Allergies  Allergen Reactions   Cefzil [Cefprozil]    Macrobid [Nitrofurantoin] Hives   Sulfa Antibiotics Hives     Outpatient Medications Prior to Visit  Medication Sig Dispense Refill   famotidine (PEPCID) 20 MG tablet TAKE 1 TABLET BY MOUTH  TWICE DAILY 694 tablet 3   folic acid (FOLVITE) 1 MG tablet TAKE 1 TABLET BY MOUTH  DAILY 90 tablet 3   methotrexate (RHEUMATREX) 2.5 MG tablet Take 4 tablets (10 mg total) by mouth once a week. 48 tablet 0   oxybutynin (DITROPAN) 5 MG tablet  Take 1 tablet (5 mg total) by mouth 2 (two) times daily. 90 tablet 3   sertraline (ZOLOFT) 100 MG tablet Take 1.5 tablets (150 mg total) by mouth daily. 135 tablet 3   XELJANZ XR 11 MG TB24 TAKE 1 TABLET BY MOUTH  DAILY 30 tablet 2   No facility-administered medications prior to visit.    Review of Systems  Constitutional:  Negative for chills, diaphoresis, fever, malaise/fatigue and weight loss.  HENT:  Negative for congestion, ear pain and sore throat.   Respiratory:  Positive for cough, sputum production and shortness of breath. Negative for hemoptysis and wheezing.   Cardiovascular:  Positive for chest pain. Negative for palpitations and leg swelling.  Gastrointestinal:  Positive for heartburn. Negative  for abdominal pain and nausea.  Genitourinary:  Negative for frequency.  Musculoskeletal:  Positive for joint pain. Negative for myalgias.  Skin:  Negative for itching and rash.  Neurological:  Negative for dizziness, weakness and headaches.  Endo/Heme/Allergies:  Does not bruise/bleed easily.  Psychiatric/Behavioral:  Positive for depression. The patient is not nervous/anxious.     Objective:   Vitals:   03/06/21 1502  BP: 132/82  Pulse: 65  Temp: 98.2 F (36.8 C)  TempSrc: Oral  SpO2: 99%  Weight: 160 lb 12.8 oz (72.9 kg)  Height: 5\' 3"  (1.6 m)   SpO2: 99 % O2 Device: None (Room air)  Physical Exam: General: Well-appearing, no acute distress HENT: [place], AT Eyes: EOMI, no scleral icterus Respiratory: Clear to auscultation bilaterally.  No crackles, wheezing or rales Cardiovascular: RRR, -M/R/G, no JVD Extremities:-Edema,-tenderness Neuro: AAO x4, CNII-XII grossly intact Psych: Normal mood, normal affect  Data Reviewed:  Imaging: CXR 08/26/20 - No infiltrate, effusion or edema  PFT: None on file  Labs: CBC    Component Value Date/Time   WBC 6.0 01/04/2021 1337   RBC 4.57 01/04/2021 1337   HGB 14.3 01/04/2021 1337   HCT 43.0 01/04/2021 1337   PLT 205 01/04/2021 1337   MCV 94.1 01/04/2021 1337   MCH 31.3 01/04/2021 1337   MCHC 33.3 01/04/2021 1337   RDW 14.3 01/04/2021 1337   LYMPHSABS 1,314 01/04/2021 1337   MONOABS 0.5 06/01/2014 0715   EOSABS 192 01/04/2021 1337   BASOSABS 30 01/04/2021 1337   Absolute eos 01/04/21 - 192     Assessment & Plan:   Discussion: 56 year old female never smoker with RA on methotrexate and Xelijanz, OA, Raynauds, hx TB exposure s/p treatment who presents as a new consult for productive cough. We discussed causes for chronic cough including UACS, reflux and undiagnosed lung issues. This may be COVID-19 long hauler symptoms vs chronic bronchitis vs underlying obstructive/restrictive lung disease. Will evaluation with PFTs and  start SABA.  Chronic bronchitis --ARRANGE pulmonary function tests --START Albuterol TWO puffs AS NEEDED. Start with once a day --If normal, consider CT Chest   Hoarseness --Consider returning to ENT or refer to [facility] as recommended --Continue pepcid --Recommend restarting pantoprazole or buy Omeprazole 20 mg daily over-the-counter. Please discuss with primary care.  Health Maintenance Immunization History  Administered Date(s) Administered   Influenza Split 10/02/2011   Influenza Whole 11/05/2008, 10/24/2009   Influenza, High Dose Seasonal PF 09/23/2014   Influenza,inj,Quad PF,6+ Mos 10/14/2012, 10/15/2016, 09/24/2017, 10/23/2018, 10/06/2020   Influenza-Unspecified 10/02/2011, 10/14/2012, 11/23/2015, 10/15/2016, 09/24/2017, 10/07/2019   PFIZER(Purple Top)SARS-COV-2 Vaccination 03/16/2019, 04/06/2019, 09/08/2019, 03/10/2020   PPD Test 02/11/2013   Pfizer Covid-19 Vaccine Bivalent Booster 18yrs & up 12/06/2020   Pneumococcal Conjugate-13 07/27/2016  Pneumococcal Polysaccharide-23 12/06/2020   Td 08/06/2002   Tdap 02/06/2019   Zoster Recombinat (Shingrix) 04/13/2020, 06/13/2020   CT Lung Screen - never smoker. Not qualified  Orders Placed This Encounter  Procedures   Pulmonary Function Test    Standing Status:   Future    Standing Expiration Date:   03/06/2022    Order Specific Question:   Where should this test be performed?    Answer:   [facility]   Meds ordered this encounter  Medications   albuterol (VENTOLIN HFA) 108 (90 Base) MCG/ACT inhaler    Sig: Inhale 2 puffs into the lungs every 6 (six) hours as needed for wheezing or shortness of breath.    Dispense:  8 g    Refill:  2    No follow-ups on file. After PFTs  I have spent a total time of 45-minutes on the day of the appointment reviewing prior documentation, coordinating care and discussing medical diagnosis and plan with the patient/family. Imaging, labs and tests included in this note have been reviewed  and interpreted independently by me.  Paul, MD [facility] 03/06/2021 3:34 PM  Office Number [phone]

## 2021-03-06 NOTE — Patient Instructions (Addendum)
Chronic bronchitis --ARRANGE pulmonary function tests --START Albuterol TWO puffs AS NEEDED. Start with once a day  Hoarseness --Consider returning to ENT or refer to [facility] as recommended in the future --Continue pepcid --Recommend restarting pantoprazole or buy Omeprazole 20 mg daily over-the-counter. Please discuss with primary care.  Follow-up with in March after PFTs

## 2021-03-13 NOTE — Progress Notes (Signed)
[facility] Return Visit  SUBJECTIVE  History of Present Illness: Jasiyah Poland is a 56 y.o. female seen in follow-up for mixed incontinence. Plan at last visit was to start Myrbetriq 25mg  for her OAB symptoms but it was too expensive so she was started on oxybutynin.    Now she can go to the gym without leaking. Does not have to wear a pad. She is feeling much more confident now. She is having dry mouth and fissures in her tongue. She has been limiting her intake of coffee.    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   Past Surgical History: She  has a past surgical history that includes Hysteroscopy with D & C (03-02-2010); Shoulder arthroscopy with open rotator cuff repair (Right, 2012); Trigger finger release (2009); Carpal tunnel release (Bilateral, 2007); Glaucoma surgery (Bilateral, 2007); Cesarean section (x2  last one 1999); Tympanoplasty (Left, 1993); Tenosynovectomy (Left, 05/21/2013); Mass excision (Left, 05/21/2013); and Cervical conization w/bx (1994).   Medications: She has a current medication list which includes the following prescription(s): albuterol, famotidine, folic acid, methotrexate, sertraline, vibegron, and xeljanz xr.   Allergies: Patient is allergic to cefzil [cefprozil], macrobid [nitrofurantoin], and sulfa antibiotics.   Social History: Patient  reports that she has never smoked. She has never used smokeless tobacco. She reports current alcohol use. She reports that she does not use drugs.      OBJECTIVE     Physical Exam: Vitals:   03/14/21 1554  BP: (!) 128/93  Pulse: 77   Gen: No apparent distress, A&O x 3.  Detailed Urogynecologic  Evaluation:  Deferred.     ASSESSMENT AND PLAN    Ms. Matsen is a 56 y.o. with:  1. Overactive bladder    - We discussed that oxybutynin may be causing the dry mouth issues and is not the best choice with her diagnosis of glaucoma. Therefore recommended switching to Gemtesa 75mg . Samples provided and prescription ordered.   Will have her return 6 weeks for follow up.   Jaquita Folds, MD

## 2021-03-14 ENCOUNTER — Encounter: Payer: Self-pay | Admitting: Obstetrics and Gynecology

## 2021-03-14 ENCOUNTER — Ambulatory Visit (INDEPENDENT_AMBULATORY_CARE_PROVIDER_SITE_OTHER): Payer: 59 | Admitting: Obstetrics and Gynecology

## 2021-03-14 ENCOUNTER — Other Ambulatory Visit: Payer: Self-pay

## 2021-03-14 VITALS — BP 128/93 | HR 77

## 2021-03-14 DIAGNOSIS — N3281 Overactive bladder: Secondary | ICD-10-CM | POA: Diagnosis not present

## 2021-03-14 MED ORDER — VIBEGRON 75 MG PO TABS
75.0000 mg | ORAL_TABLET | Freq: Every day | ORAL | 1 refills | Status: DC
Start: 2021-03-14 — End: 2021-04-25

## 2021-03-28 ENCOUNTER — Telehealth: Payer: Self-pay | Admitting: Rheumatology

## 2021-03-28 NOTE — Telephone Encounter (Signed)
Patient advised that no one from our office reached out to her today. Patient reminded she is due for labs this month. Patient expressed understanding.  ?

## 2021-03-28 NOTE — Telephone Encounter (Signed)
Patient called the office stating she had missed a call from Dr. Estanislado Pandy this morning and requests a call back. ?

## 2021-03-28 NOTE — Telephone Encounter (Signed)
Please notify patient that I did not call her.

## 2021-03-30 ENCOUNTER — Other Ambulatory Visit: Payer: Self-pay | Admitting: Physician Assistant

## 2021-03-30 NOTE — Telephone Encounter (Signed)
Next Visit: 07/19/2021 ? ?Last Visit: 02/15/2021 ? ?Last Fill: 01/05/2021 ? ?DX: Rheumatoid arthritis involving multiple sites with positive rheumatoid factor  ? ?Current Dose per office note 02/15/2021: methotrexate 4 tablets by mouth once weekly ? ?Labs: 01/04/2021 CBC and CMP WNL ? ?Patient aware she is due to update labs this month. ? ?Okay to refill MTX?  ?

## 2021-04-04 ENCOUNTER — Telehealth: Payer: Self-pay | Admitting: Family Medicine

## 2021-04-04 NOTE — Telephone Encounter (Signed)
Pt want to change medication from Fatomide back to Pantoprazole, recommended by the Pulmonologist. A letter from [facility] has been placed in Dr. Lennie Odor box for review. ?

## 2021-04-13 ENCOUNTER — Other Ambulatory Visit: Payer: Self-pay | Admitting: *Deleted

## 2021-04-13 DIAGNOSIS — Z79899 Other long term (current) drug therapy: Secondary | ICD-10-CM

## 2021-04-13 DIAGNOSIS — Z9225 Personal history of immunosupression therapy: Secondary | ICD-10-CM

## 2021-04-13 DIAGNOSIS — Z111 Encounter for screening for respiratory tuberculosis: Secondary | ICD-10-CM

## 2021-04-14 ENCOUNTER — Other Ambulatory Visit: Payer: Self-pay

## 2021-04-14 ENCOUNTER — Ambulatory Visit: Payer: 59 | Admitting: Pulmonary Disease

## 2021-04-14 ENCOUNTER — Encounter: Payer: Self-pay | Admitting: Pulmonary Disease

## 2021-04-14 ENCOUNTER — Ambulatory Visit (INDEPENDENT_AMBULATORY_CARE_PROVIDER_SITE_OTHER): Payer: 59 | Admitting: Pulmonary Disease

## 2021-04-14 ENCOUNTER — Encounter: Payer: Self-pay | Admitting: Family Medicine

## 2021-04-14 VITALS — BP 118/86 | HR 72 | Ht 63.5 in | Wt 164.4 lb

## 2021-04-14 DIAGNOSIS — R059 Cough, unspecified: Secondary | ICD-10-CM | POA: Diagnosis not present

## 2021-04-14 DIAGNOSIS — R490 Dysphonia: Secondary | ICD-10-CM | POA: Diagnosis not present

## 2021-04-14 DIAGNOSIS — J42 Unspecified chronic bronchitis: Secondary | ICD-10-CM

## 2021-04-14 DIAGNOSIS — J342 Deviated nasal septum: Secondary | ICD-10-CM | POA: Diagnosis not present

## 2021-04-14 LAB — PULMONARY FUNCTION TEST
DL/VA % pred: 98 %
DL/VA: 4.19 ml/min/mmHg/L
DLCO cor % pred: 86 %
DLCO cor: 17.57 ml/min/mmHg
DLCO unc % pred: 89 %
DLCO unc: 18.13 ml/min/mmHg
FEF 25-75 Post: 4.44 L/sec
FEF 25-75 Pre: 4.29 L/sec
FEF2575-%Change-Post: 3 %
FEF2575-%Pred-Post: 178 %
FEF2575-%Pred-Pre: 172 %
FEV1-%Change-Post: -1 %
FEV1-%Pred-Post: 102 %
FEV1-%Pred-Pre: 103 %
FEV1-Post: 2.67 L
FEV1-Pre: 2.7 L
FEV1FVC-%Change-Post: 1 %
FEV1FVC-%Pred-Pre: 110 %
FEV6-%Change-Post: -2 %
FEV6-%Pred-Post: 93 %
FEV6-%Pred-Pre: 95 %
FEV6-Post: 3.02 L
FEV6-Pre: 3.09 L
FEV6FVC-%Change-Post: 0 %
FEV6FVC-%Pred-Post: 103 %
FEV6FVC-%Pred-Pre: 103 %
FVC-%Change-Post: -2 %
FVC-%Pred-Post: 90 %
FVC-%Pred-Pre: 92 %
FVC-Post: 3.02 L
FVC-Pre: 3.09 L
Post FEV1/FVC ratio: 89 %
Post FEV6/FVC ratio: 100 %
Pre FEV1/FVC ratio: 87 %
Pre FEV6/FVC Ratio: 100 %
RV % pred: 88 %
RV: 1.67 L
TLC % pred: 99 %
TLC: 4.98 L

## 2021-04-14 MED ORDER — PANTOPRAZOLE SODIUM 40 MG PO TBEC: 40.0000 mg | DELAYED_RELEASE_TABLET | Freq: Every day | ORAL | 3 refills | Status: DC

## 2021-04-14 NOTE — Progress Notes (Signed)
? ? ?Subjective:  ? ?PATIENT ID: Jenna Gordon GENDER: female DOB: 1965/12/19, MRN: [id number] ? ? ?HPI ? ?Chief Complaint  ?Patient presents with  ? Follow-up  ?  Pft results  ? ? ?Reason for Visit: Follow-up ? ?Ms. Jenna Gordon is a 56 year old female never smoker with RA on methotrexate and Xelijanz, OA, Raynauds, hx TB exposure s/p treatment who presents for PFT follow-up ? ?Synopsis: ?She reports long standing sputum production for 6-7 years that occurs throughout the day that does not improve. Sputum is thick and white. It worsens with illness and after she had COVID in Jan 2021 and July 2022. Sometimes associated with shortness of breath. Denies wheezing except when she was ill. Denies recurrent respiratory infections during childhood. She has a long history of hoarseness that has not been worked up. Denies nasal congestion. She reports reflux on pepcid with breakthrough symptoms. She was seen by ENT last year and recommended injections for thicker vocal cords however has not proceeded as she feels used to her hoarseness. ? ?04/14/21 ?Cough is nonproductive now. Remains persistent with coughing fits at random times. Unable to take deep breaths without coughing. She is active with her children including in sports (skiing) and will not have issues but then other times when doing moderate activity or event with talking she will have coughing spells. She has been seen by ENT in the past and told that her vocal cords are abnormal and may need intervention. ? ?Social History: ?Her mother is one of my patients Jenna Gordon, pulm fibrosis) ?Never smoker ?TB exposure in 2014. 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 ?I have personally reviewed patient's past medical/family/social history, allergies, current medications. ? ?Past Medical History:  ?Diagnosis Date  ? Amnesia memory loss 10/14/2012  ? Dry  eyes   ? History of glaucoma   ? s/p   bilateral eye laser treatment  2007  ? History of traumatic head injury 10/14/2012  ? Lupus (systemic lupus erythematosus) ([place])   ? Positive TB test   ? pt states cxr negative was tested due to treatment for rheumatoid arthritis since test +  , was told to take medication for tb for 9 months before going back on humira injection  ? Rheumatoid arthritis(714.0)   ? Dr. Dondra Spry, [facility]  ? Sjogren's disease ([place])   ? SUI (stress urinary incontinence, female)   ? Wears glasses   ?  ? ?Family History  ?Problem Relation Age of Onset  ? CVA Mother   ?     TIA  ? Hypertension Mother   ? Cystic fibrosis Mother   ? Stomach cancer Father   ? Diabetes Father   ? Hypertension Father   ? Hypertension Brother   ? Hypertension Brother   ? Healthy Daughter   ? Healthy Daughter   ?  ? ?Social History  ? ?Occupational History  ? Not on file  ?Tobacco Use  ? Smoking status: Never  ? Smokeless tobacco: Never  ?Vaping Use  ? Vaping Use: Never used  ?Substance and Sexual Activity  ? Alcohol use: Yes  ?  Comment: wine occ  ? Drug use: No  ? Sexual activity: Not Currently  ? ? ?Allergies  ?Allergen Reactions  ? Cefzil [Cefprozil]   ? Macrobid [Nitrofurantoin] Hives  ? Sulfa Antibiotics Hives  ?  ? ?Outpatient Medications Prior to Visit  ?Medication  Sig Dispense Refill  ? albuterol (VENTOLIN HFA) 108 (90 Base) MCG/ACT inhaler Inhale 2 puffs into the lungs every 6 (six) hours as needed for wheezing or shortness of breath. 8 g 2  ? famotidine (PEPCID) 20 MG tablet TAKE 1 TABLET BY MOUTH  TWICE DAILY 180 tablet 3  ? folic acid (FOLVITE) 1 MG tablet TAKE 1 TABLET BY MOUTH  DAILY 90 tablet 3  ? methotrexate (RHEUMATREX) 2.5 MG tablet TAKE 4 TABLETS BY MOUTH ONCE A  WEEK 48 tablet 0  ? sertraline (ZOLOFT) 100 MG tablet Take 1.5 tablets (150 mg total) by mouth daily. 135 tablet 3  ? Vibegron 75 MG TABS Take 75 mg by mouth daily. 90 tablet 1  ? XELJANZ XR 11 MG TB24 TAKE 1 TABLET BY MOUTH   DAILY 30 tablet 2  ? ?No facility-administered medications prior to visit.  ? ? ?Review of Systems  ?Constitutional:  Negative for chills, diaphoresis, fever, malaise/fatigue and weight loss.  ?HENT:  Negative for congestion.   ?Respiratory:  Positive for cough. Negative for hemoptysis, sputum production, shortness of breath and wheezing.   ?Cardiovascular:  Negative for chest pain, palpitations and leg swelling.  ? ? ?Objective:  ? ?Vitals:  ? 04/14/21 1353  ?BP: 118/86  ?Pulse: 72  ?SpO2: 96%  ?Weight: 164 lb 6.4 oz (74.6 kg)  ?Height: 5' 3.5" (1.613 m)  ? ?SpO2: 96 % ?O2 Device: None (Room air) ? ?Physical Exam: ?General: Well-appearing, no acute distress ?HENT: [place], AT ?Eyes: EOMI, no scleral icterus ?Respiratory: Clear to auscultation bilaterally.  No crackles, wheezing or rales ?Cardiovascular: RRR, -M/R/G, no JVD ?Extremities:-Edema,-tenderness ?Neuro: AAO x4, CNII-XII grossly intact ?Psych: Normal mood, normal affect ? ?Data Reviewed: ? ?Imaging: ?CXR 08/26/20 - No infiltrate, effusion or edema ? ?PFT: ?04/14/21 ?FVC 3.02 (90%) FEV1 2.67 (102%) Ratio 87  TLC 99% DLCO 89%. No sig BD. F-V with variable extrathoracic obstruction ?Interpretation: Normal PFTs. F-V with variable extrathoracic obstruction ? ? ?Labs: ?CBC ?   ?Component Value Date/Time  ? WBC 5.0 04/13/2021 0828  ? RBC 4.64 04/13/2021 0828  ? HGB 14.5 04/13/2021 0828  ? HCT 44.0 04/13/2021 0828  ? PLT 205 04/13/2021 0828  ? MCV 94.8 04/13/2021 0828  ? MCH 31.3 04/13/2021 0828  ? MCHC 33.0 04/13/2021 0828  ? RDW 13.6 04/13/2021 0828  ? LYMPHSABS 1,030 04/13/2021 0828  ? MONOABS 0.5 06/01/2014 0715  ? EOSABS 230 04/13/2021 0828  ? BASOSABS 10 04/13/2021 0828  ? ?Absolute eos 01/04/21 - 192 ? ?   ?Assessment & Plan:  ? ?Discussion: ?56 year old female never smoker with RA on methotrexate and Xelijanz, OA, Raynauds, hx TB exposure s/p treatment who presents for follow-up for cough and PFTs. PFTs reviewed and overall normal. Possible F-V with variable  extrathoracic obstruction that I suspect is related to her vocal cords. We reviewed causes of cough including reflux, prior COVID and upper airway obstruction. Will complete pulmonary work-up CT chest and if this is normal, no further testing indicated. ? ?Chronic bronchitis ?Cough ?--ORDER CT Chest with contrast  ?--STOP Albuterol ? ?Hoarseness ?--REFER AMB to ENT or refer ?--Continue pepcid ?--Recommend Omeprazole 20 mg daily over-the-counter.  ? ?Health Maintenance ?Immunization History  ?Administered Date(s) Administered  ? Influenza Split 10/02/2011  ? Influenza Whole 11/05/2008, 10/24/2009  ? Influenza, High Dose Seasonal PF 09/23/2014  ? Influenza,inj,Quad PF,6+ Mos 10/14/2012, 10/15/2016, 09/24/2017, 10/23/2018, 10/06/2020  ? Influenza-Unspecified 10/02/2011, 10/14/2012, 11/23/2015, 10/15/2016, 09/24/2017, 10/07/2019  ? PFIZER(Purple Top)SARS-COV-2 Vaccination 03/16/2019, 04/06/2019, 09/08/2019,  03/10/2020  ? PPD Test 02/11/2013  ? Pension scheme manager 65yr & up 12/06/2020  ? Pneumococcal Conjugate-13 07/27/2016  ? Pneumococcal Polysaccharide-23 12/06/2020  ? Td 08/06/2002  ? Tdap 02/06/2019  ? Zoster Recombinat (Shingrix) 04/13/2020, 06/13/2020  ? ?CT Lung Screen - never smoker. Not qualified ? ?Orders Placed This Encounter  ?Procedures  ? CT Chest W Contrast  ?  Standing Status:   Future  ?  Standing Expiration Date:   04/15/2022  ?  Scheduling Instructions:  ?   CT Chest with contrast in 3 weeks  ?  Order Specific Question:   If indicated for the ordered procedure, I authorize the administration of contrast media per Radiology protocol  ?  Answer:   Yes  ?  Order Specific Question:   Is patient pregnant?  ?  Answer:   No  ?  Order Specific Question:   Preferred imaging location?  ?  Answer:   [facility] ? Ambulatory referral to ENT  ?  Referral Priority:   Routine  ?  Referral Type:   Consultation  ?  Referral Reason:   Specialty Services Required  ?  Requested Specialty:    Otolaryngology  ?  Number of Visits Requested:   1  ? ?No orders of the defined types were placed in this encounter. ? ? ?Return in about 1 month (around 05/15/2021).  ? ?I have spent a total time of 32-min

## 2021-04-14 NOTE — Telephone Encounter (Signed)
Rx sent in. ?Sent patient a mychart message answering her question ?Leeanne Rio, MD  ?

## 2021-04-14 NOTE — Progress Notes (Signed)
PFT done today. 

## 2021-04-14 NOTE — Patient Instructions (Signed)
Chronic bronchitis ?Cough ?--ORDER CT Chest with contrast in 3 weeks ?--STOP Albuterol ? ?Hoarseness ?--REFER AMB to ENT or refer ?--Continue pepcid ?--Recommend Omeprazole 20 mg daily over-the-counter.  ? ?Follow-up with me in 1 month ?

## 2021-04-14 NOTE — Progress Notes (Signed)
CBC and CMP are normal.

## 2021-04-18 LAB — CBC WITH DIFFERENTIAL/PLATELET
Absolute Monocytes: 400 cells/uL (ref 200–950)
Basophils Absolute: 10 cells/uL (ref 0–200)
Basophils Relative: 0.2 %
Eosinophils Absolute: 230 cells/uL (ref 15–500)
Eosinophils Relative: 4.6 %
HCT: 44 % (ref 35.0–45.0)
Hemoglobin: 14.5 g/dL (ref 11.7–15.5)
Lymphs Abs: 1030 cells/uL (ref 850–3900)
MCH: 31.3 pg (ref 27.0–33.0)
MCHC: 33 g/dL (ref 32.0–36.0)
MCV: 94.8 fL (ref 80.0–100.0)
MPV: 10.8 fL (ref 7.5–12.5)
Monocytes Relative: 8 %
Neutro Abs: 3330 cells/uL (ref 1500–7800)
Neutrophils Relative %: 66.6 %
Platelets: 205 10*3/uL (ref 140–400)
RBC: 4.64 10*6/uL (ref 3.80–5.10)
RDW: 13.6 % (ref 11.0–15.0)
Total Lymphocyte: 20.6 %
WBC: 5 10*3/uL (ref 3.8–10.8)

## 2021-04-18 LAB — QUANTIFERON-TB GOLD PLUS
Mitogen-NIL: 7.91 IU/mL
NIL: 0.02 IU/mL
QuantiFERON-TB Gold Plus: NEGATIVE
TB1-NIL: 0.01 IU/mL
TB2-NIL: 0.03 IU/mL

## 2021-04-18 LAB — COMPLETE METABOLIC PANEL WITH GFR
AG Ratio: 1.4 (calc) (ref 1.0–2.5)
ALT: 25 U/L (ref 6–29)
AST: 25 U/L (ref 10–35)
Albumin: 4.2 g/dL (ref 3.6–5.1)
Alkaline phosphatase (APISO): 67 U/L (ref 37–153)
BUN: 22 mg/dL (ref 7–25)
CO2: 25 mmol/L (ref 20–32)
Calcium: 9.3 mg/dL (ref 8.6–10.4)
Chloride: 109 mmol/L (ref 98–110)
Creat: 0.82 mg/dL (ref 0.50–1.03)
Globulin: 2.9 g/dL (calc) (ref 1.9–3.7)
Glucose, Bld: 89 mg/dL (ref 65–99)
Potassium: 4.7 mmol/L (ref 3.5–5.3)
Sodium: 139 mmol/L (ref 135–146)
Total Bilirubin: 0.5 mg/dL (ref 0.2–1.2)
Total Protein: 7.1 g/dL (ref 6.1–8.1)
eGFR: 84 mL/min/{1.73_m2} (ref >=60)

## 2021-04-18 NOTE — Progress Notes (Signed)
TB Gold negative

## 2021-04-25 ENCOUNTER — Encounter: Payer: Self-pay | Admitting: Obstetrics and Gynecology

## 2021-04-25 ENCOUNTER — Ambulatory Visit: Payer: 59 | Admitting: Obstetrics and Gynecology

## 2021-04-25 DIAGNOSIS — N3281 Overactive bladder: Secondary | ICD-10-CM

## 2021-04-25 MED ORDER — VIBEGRON 75 MG PO TABS: 75.0000 mg | ORAL_TABLET | Freq: Every day | ORAL | 4 refills | Status: DC

## 2021-04-25 NOTE — Progress Notes (Signed)
[facility] ?Return Visit ? ?SUBJECTIVE  ?History of Present Illness: ?Morgan Keinath is a 56 y.o. female seen in follow-up for overactive bladder. Plan at last visit was to start Gemtesa '75mg'$  (previously tired myrbetriq and oxybutynin).  ? ?Symptoms have been better with the Gemtesa. She took the samples for a month but then the medication was not covered by her insurance. She went back on the oxybutynin but it has again caused severe dry mouth.  ? 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  ? ?Past Surgical History: ?She  has a past surgical history that includes Hysteroscopy with D & C (03-02-2010); Shoulder arthroscopy with open rotator cuff repair (Right, 2012); Trigger finger release (2009); Carpal tunnel release (Bilateral, 2007); Glaucoma surgery (Bilateral, 2007); Cesarean section (x2  last one 1999); Tympanoplasty (Left, 1993); Tenosynovectomy (Left, 05/21/2013); Mass excision (Left, 05/21/2013); and Cervical conization w/bx (1994).  ? ?Medications: ?She has a current medication list which includes the following prescription(s): albuterol, famotidine, folic acid, methotrexate, pantoprazole, sertraline, xeljanz xr, and vibegron.  ? ?Allergies: ?Patient is allergic to cefzil [cefprozil], macrobid [nitrofurantoin], and sulfa antibiotics.  ? ?Social History: ?Patient  reports that she has never smoked. She has never used smokeless tobacco. She reports current alcohol use. She reports that she does not use drugs.  ?  ?  ?OBJECTIVE  ?  ? ?Physical Exam: ?Vitals:  ? 04/25/21 1406  ?BP: 131/81  ?Pulse: 60  ? ?Gen: No apparent distress, A&O x 3. ? ?Detailed Urogynecologic Evaluation:  ?Deferred.  ?     ? ?ASSESSMENT AND PLAN  ?  ?Ms. Bosque is a 56 y.o. with:  ?1. Overactive bladder   ? ?- Will send prescription for Gemtesa to [facility] and attempt prior authorization. Also walked through getting a savings card, which she applied for on her phone.  ?- She will let us know if she is unable to get the prescription.  ? ?Follow up 3 months or sooner if needed ? ?Jaquita Folds, MD ? ?Time spent: I spent 25 minutes dedicated to the care of this patient on the date of this encounter to include pre-visit review of records, face-to-face time with the patient and post visit documentation and ordering medication/ testing.  ? ?

## 2021-04-27 NOTE — Progress Notes (Signed)
Submitted PA for Gemtesa '75mg'$  on Cover my Meds. ?Key: [id number] ?PA Case ID: [id number] ?Outcome: PENDING  ? ? ?

## 2021-05-08 ENCOUNTER — Ambulatory Visit (INDEPENDENT_AMBULATORY_CARE_PROVIDER_SITE_OTHER)
Admission: RE | Admit: 2021-05-08 | Discharge: 2021-05-08 | Disposition: A | Discharge status: Home / Self Care | Payer: 59 | Source: Ambulatory Visit | Attending: Pulmonary Disease | Admitting: Pulmonary Disease

## 2021-05-08 ENCOUNTER — Other Ambulatory Visit: Payer: Self-pay | Admitting: Family Medicine

## 2021-05-08 DIAGNOSIS — R059 Cough, unspecified: Secondary | ICD-10-CM | POA: Diagnosis not present

## 2021-05-08 MED ORDER — PANTOPRAZOLE SODIUM 40 MG PO TBEC
40.0000 mg | DELAYED_RELEASE_TABLET | Freq: Every day | ORAL | 3 refills | Status: DC
Start: 2021-05-08 — End: 2022-06-21

## 2021-05-08 MED ORDER — IOHEXOL 300 MG/ML  SOLN
80.0000 mL | Freq: Once | INTRAMUSCULAR | Status: AC | PRN
  Administered 2021-05-08: 80 mL via INTRAVENOUS

## 2021-05-11 ENCOUNTER — Telehealth: Payer: Self-pay | Admitting: Pulmonary Disease

## 2021-05-12 NOTE — Telephone Encounter (Signed)
Called and spoke with pt letting her know that referral to ENT was placed after OV and the referral went to [facility]. Provided pt their phone number for her to call to see if she could put a rush on getting an appt scheduled since she has not heard from them. Nothing further needed. ?

## 2021-05-15 ENCOUNTER — Encounter: Payer: Self-pay | Admitting: Pulmonary Disease

## 2021-05-15 ENCOUNTER — Ambulatory Visit: Payer: 59 | Admitting: Pulmonary Disease

## 2021-05-15 VITALS — BP 110/66 | HR 71 | Ht 63.5 in | Wt 161.6 lb

## 2021-05-15 DIAGNOSIS — R490 Dysphonia: Secondary | ICD-10-CM

## 2021-05-15 DIAGNOSIS — J42 Unspecified chronic bronchitis: Secondary | ICD-10-CM

## 2021-05-15 NOTE — Progress Notes (Signed)
? ? ?Subjective:  ? ?PATIENT ID: Jenna Gordon DOB: 1965-06-09, MRN: [id number] ? ? ?HPI ? ?No chief complaint on file. ? ? ?Reason for Visit: Follow-up ? ?Jenna Gordon is a 56 year old Gordon never smoker with RA on methotrexate and Xelijanz, OA, Raynauds, hx TB exposure s/p treatment who presents for follow-up. ? ?Synopsis: ?She reports long standing sputum production for 6-7 years that occurs throughout the day that does not improve. Sputum is thick and white. It worsens with illness and after she had COVID in Jan 2021 and July 2022. Sometimes associated with shortness of breath. Denies wheezing except when she was ill. Denies recurrent respiratory infections during childhood. She has a long history of hoarseness that has not been worked up. Denies nasal congestion. She reports reflux on pepcid with breakthrough symptoms. She was seen by ENT last year and recommended injections for thicker vocal cords however has not proceeded as she feels used to her hoarseness. ? ?04/14/21 ?Cough is nonproductive now. Remains persistent with coughing fits at random times. Unable to take deep breaths without coughing. She is active with her children including in sports (skiing) and will not have issues but then other times when doing moderate activity or event with talking she will have coughing spells. She has been seen by ENT in the past and told that her vocal cords are abnormal and may need intervention. ? ?05/15/21 ?She reports coughing that she attributes to allergies. It does intermittently occur at random times and relieved with water. She is scheduled for June 2023 with ENT. She is taking omeprazole. ? ?Social History: ?Her mother is one of my patients Sunday Corn Vigoya, pulm fibrosis) ?Never smoker ?TB exposure in 2014. 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 ?Past Medical History:   ?Diagnosis Date  ? Amnesia memory loss 10/14/2012  ? Dry eyes   ? History of glaucoma   ? s/p   bilateral eye laser treatment  2007  ? History of traumatic head injury 10/14/2012  ? Lupus (systemic lupus erythematosus) (H[facility])   ? Positive TB test   ? pt states cxr negative was tested due to treatment for rheumatoid arthritis since test +  , was told to take medication for tb for 9 months before going back on humira injection  ? Rheumatoid arthritis(714.0)   ? Dr. Ed Blalock, [facility]  ? Sjogren's disease (H[facility])   ? SUI (stress urinary incontinence, Gordon)   ? Wears glasses   ?  ? ?Family History  ?Problem Relation Age of Onset  ? CVA Mother   ?     TIA  ? Hypertension Mother   ? Cystic fibrosis Mother   ? Stomach cancer Father   ? Diabetes Father   ? Hypertension Father   ? Hypertension Brother   ? Hypertension Brother   ? Healthy Daughter   ? Healthy Daughter   ?  ? ?Social History  ? ?Occupational History  ? Not on file  ?Tobacco Use  ? Smoking status: Never  ? Smokeless tobacco: Never  ?Vaping Use  ? Vaping Use: Never used  ?Substance and Sexual Activity  ? Alcohol use: Yes  ?  Comment: wine occ  ? Drug use: No  ? Sexual activity: Not Currently  ? ? ?Allergies  ?Allergen Reactions  ? Cefzil [Cefprozil]   ? Macrobid [Nitrofurantoin] Hives  ? Sulfa Antibiotics Hives  ?  ? ?  Outpatient Medications Prior to Visit  ?Medication Sig Dispense Refill  ? folic acid (FOLVITE) 1 MG tablet TAKE 1 TABLET BY MOUTH  DAILY 90 tablet 3  ? methotrexate (RHEUMATREX) 2.5 MG tablet TAKE 4 TABLETS BY MOUTH ONCE A  WEEK 48 tablet 0  ? pantoprazole (PROTONIX) 40 MG tablet Take 1 tablet (40 mg total) by mouth daily. 90 tablet 3  ? sertraline (ZOLOFT) 100 MG tablet Take 1.5 tablets (150 mg total) by mouth daily. 135 tablet 3  ? Vibegron 75 MG TABS Take 75 mg by mouth daily. 90 tablet 4  ? XELJANZ XR 11 MG TB24 TAKE 1 TABLET BY MOUTH  DAILY 30 tablet 2  ? albuterol (VENTOLIN HFA) 108 (90 Base) MCG/ACT inhaler Inhale 2 puffs  into the lungs every 6 (six) hours as needed for wheezing or shortness of breath. 8 g 2  ? famotidine (PEPCID) 20 MG tablet TAKE 1 TABLET BY MOUTH  TWICE DAILY 180 tablet 3  ? ?No facility-administered medications prior to visit.  ? ? ?Review of Systems  ?Constitutional:  Negative for chills, diaphoresis, fever, malaise/fatigue and weight loss.  ?HENT:  Negative for congestion.   ?Respiratory:  Positive for cough. Negative for hemoptysis, sputum production, shortness of breath and wheezing.   ?Cardiovascular:  Negative for chest pain, palpitations and leg swelling.  ? ? ?Objective:  ? ?Vitals:  ? 05/15/21 1522  ?BP: 110/66  ?Pulse: 71  ?SpO2: 99%  ?Weight: 161 lb 9.6 oz (73.3 kg)  ?Height: 5' 3.5" (1.613 m)  ? ?SpO2: 99 % ?O2 Device: None (Room air) ? ?Physical Exam: ?General: Well-appearing, no acute distress ?HENT: [place], AT ?Eyes: EOMI, no scleral icterus ?Respiratory: Clear to auscultation bilaterally.  No crackles, wheezing or rales ?Cardiovascular: RRR, -M/R/G, no JVD ?Extremities:-Edema,-tenderness ?Neuro: AAO x4, CNII-XII grossly intact ?Psych: Normal mood, normal affect ? ?Data Reviewed: ? ?Imaging: ?CXR 08/26/20 - No infiltrate, effusion or edema ?CT Chest 05/08/21 - Right upper lobe pulmonary nodule measuring 3mm. No significant GGO, septal thickening, subpleural reticulation, traction bronchiectasis or honeycombing present. ? ?PFT: ?04/14/21 ?FVC 3.02 (90%) FEV1 2.67 (102%) Ratio 87  TLC 99% DLCO 89%. No sig BD. F-V with variable extrathoracic obstruction ?Interpretation: Normal PFTs. F-V with variable extrathoracic obstruction ? ? ?Labs: ?CBC ?   ?Component Value Date/Time  ? WBC 5.0 04/13/2021 0828  ? RBC 4.64 04/13/2021 0828  ? HGB 14.5 04/13/2021 0828  ? HCT 44.0 04/13/2021 0828  ? PLT 205 04/13/2021 0828  ? MCV 94.8 04/13/2021 0828  ? MCH 31.3 04/13/2021 0828  ? MCHC 33.0 04/13/2021 0828  ? RDW 13.6 04/13/2021 0828  ? LYMPHSABS 1,030 04/13/2021 0828  ? MONOABS 0.5 06/01/2014 0715  ? EOSABS 230 04/13/2021  0828  ? BASOSABS 10 04/13/2021 0828  ? ?Absolute eos 01/04/21 - 192 ? ?   ?Assessment & Plan:  ? ?Discussion: ?56 year old Gordon never smoker with RA on methotrexate and Xelijanz, OA, Raynauds, hx TB exposure s/p treatment who presents for follow-up. Overall normal PFTs. CT Chest reviewed with normal parenchyma. Reassuring that there are no signs of symptoms of fibrosis or any other lung disease.  ? ?Chronic bronchitis ?--PFTs normal ?--CT Chest with normal parenchyma ?--No indication for bronchodilators ? ?RUL pulmonary nodule 3mm, likely benign ?--No further follow-up warranted ? ?Hoarseness ?--Keep follow with ENT ?--Continue pepcid ?--Recommend Omeprazole 20 mg daily over-the-counter.  ? ?Health Maintenance ?Immunization History  ?Administered Date(s) Administered  ? Influenza Split 10/02/2011  ? Influenza Whole 11/05/2008, 10/24/2009  ?  Influenza, High Dose Seasonal PF 09/23/2014  ? Influenza,inj,Quad PF,6+ Mos 10/14/2012, 10/15/2016, 09/24/2017, 10/23/2018, 10/06/2020  ? Influenza-Unspecified 10/02/2011, 10/14/2012, 11/23/2015, 10/15/2016, 09/24/2017, 10/07/2019  ? PFIZER(Purple Top)SARS-COV-2 Vaccination 03/16/2019, 04/06/2019, 09/08/2019, 03/10/2020  ? PPD Test 02/11/2013  ? Research officer, trade [employer] 66yrs & up 12/06/2020  ? Pneumococcal Conjugate-13 07/27/2016  ? Pneumococcal Polysaccharide-23 12/06/2020  ? Td 08/06/2002  ? Tdap 02/06/2019  ? Zoster Recombinat (Shingrix) 04/13/2020, 06/13/2020  ? ?CT Lung Screen - never smoker. Not qualified ? ?No orders of the defined types were placed in this encounter. ? ?No orders of the defined types were placed in this encounter. ? ? ?Return if symptoms worsen or fail to improve.  ? ?I have spent a total time of 30-minutes on the day of the appointment reviewing prior documentation, coordinating care and discussing medical diagnosis and plan with the patient/family. Past medical history, allergies, medications were reviewed. Pertinent imaging, labs and  tests included in this note have been reviewed and interpreted independently by me. ? ?Josealberto Montalto Mechele Collin, MD ?[facility] ?05/15/2021 3:35 PM  ?Office Number [phone] ? ? ?

## 2021-05-18 NOTE — Progress Notes (Signed)
DENIED- ?Another request was sent via [facility] thru Cover my Meds ?

## 2021-05-18 NOTE — Progress Notes (Signed)
Submitted PA for [employer] '75mg'$  on Cover my Meds for [employer]. ?Key: [id number] ?PA Case ID: [id number] ?Outcome: DENIED ? ?COVER MY MEDS: ? ?Why was my request denied? ?This request was denied because you did not meet the following clinical requirements: ?The requested medication and/or diagnosis are not a covered benefit and excluded from coverage inaccordance with the terms and conditions of your plan benefit. Therefore, the request has been administratively denied. ? ?The requested medication and/or diagnosis are not a covered benefit and are excluded from coverage in accordance with the terms and conditions of your plan benefit. Therefore, this request has been administratively denied. ? ?The reason(s) [facility] did not approve this medication can be found above. This denial is based on our [facility] drug coverage policy, in addition to any supplementary information you or your prescriber may have submitted. ? ? ? ?

## 2021-05-27 ENCOUNTER — Other Ambulatory Visit: Payer: Self-pay | Admitting: Physician Assistant

## 2021-05-29 NOTE — Telephone Encounter (Signed)
Next Visit: 07/19/2021 ?  ?Last Visit: 02/15/2021 ?  ?Last Fill: 01/09/2021 ? ?DX: Rheumatoid arthritis involving multiple sites with positive rheumatoid factor  ?  ?Current Dose per office note 02/15/2021: Morrie Sheldon 11 mg XR daily ? ?Labs: 04/13/2021 CBC and CMP are normal ? ?TB Gold: 04/13/2021 Neg  ? ?Okay to refill Morrie Sheldon?  ?

## 2021-06-27 ENCOUNTER — Encounter: Payer: Self-pay | Admitting: *Deleted

## 2021-07-04 ENCOUNTER — Telehealth: Payer: Self-pay | Admitting: Pulmonary Disease

## 2021-07-06 NOTE — Progress Notes (Signed)
Office Visit Note  Patient: Jenna Gordon             Date of Birth: 09-23-1965           MRN: [id number]             PCP: Leeanne Rio, MD Referring: Leeanne Rio, MD Visit Date: 07/19/2021 Occupation: '@GUAROCC'$ @  Subjective:  Flare   History of Present Illness: Jenna Gordon is a 56 y.o. female with history of seropositive rheumatoid arthritis.  She is taking xeljanz 11 mg XR daily and methotrexate 4 tablets by mouth once weekly.  Patient reports that she has been experiencing more frequent flares since reducing the dose of methotrexate from 6 tablets to 4 tablets weekly.  She is currently having a flare in the left elbow and left hand.  The flare started over the weekend with no overuse or repetitive activities prior to the onset of symptoms.  She is also been experiencing increased stiffness in both knees.  She continues to go to [employer] twice a week and has been stretching daily.  She denies any new rheumatoid nodules.  She denies any recent or recurrent infections.   Activities of Daily Living:  Patient reports morning stiffness for 2 hours.   Patient Reports nocturnal pain.  Difficulty dressing/grooming: Reports Difficulty climbing stairs: Reports Difficulty getting out of chair: Reports Difficulty using hands for taps, buttons, cutlery, and/or writing: Reports  Review of Systems  Constitutional:  Positive for fatigue.  HENT:  Positive for mouth sores and mouth dryness. Negative for nose dryness.   Eyes:  Positive for redness and dryness. Negative for pain and visual disturbance.  Respiratory:  Negative for cough, hemoptysis, shortness of breath and difficulty breathing.   Cardiovascular:  Negative for chest pain, palpitations, hypertension and swelling in legs/feet.  Gastrointestinal:  Negative for abdominal pain, blood in stool, constipation and diarrhea.  Endocrine: Positive for heat intolerance. Negative for increased urination.  Genitourinary:   Negative for painful urination.  Musculoskeletal:  Positive for joint pain, joint pain, joint swelling and morning stiffness. Negative for myalgias, muscle weakness, muscle tenderness and myalgias.  Skin:  Negative for color change, pallor, rash, hair loss, nodules/bumps, skin tightness, ulcers and sensitivity to sunlight.  Allergic/Immunologic: Negative for susceptible to infections.  Neurological:  Negative for dizziness, numbness and headaches.  Hematological:  Negative for swollen glands.  Psychiatric/Behavioral:  Negative for depressed mood and sleep disturbance. The patient is not nervous/anxious.     PMFS History:  Patient Active Problem List   Diagnosis Date Noted   Chronic bronchitis ([place]) 03/06/2021   Hoarseness 03/06/2021   Stress incontinence 12/06/2020   Rib pain on right side 04/13/2020   High risk HPV infection 12/10/2019   Mouth lesion 06/01/2019   Depression, recurrent ([place]) 06/01/2019   Corneal irritation of both eyes 03/04/2019   Fibroid uterus 06/20/2018   Grade II internal hemorrhoids 06/20/2018   Mixed hyperlipidemia 03/14/2016   Environmental allergies 03/14/2016   Gastroesophageal reflux disease without esophagitis 02/24/2015   Osteopenia 02/24/2015   Rash 07/14/2014   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   Raynaud phenomenon 04/11/2012   ANEMIA, IRON DEFICIENCY 09/08/2009   SJOGREN'S SYNDROME 01/06/2008   Rheumatoid arthritis of multiple sites with negative rheumatoid factor ([place]) 04/09/2007    Past Medical History:  Diagnosis Date   Amnesia memory loss 10/14/2012   Dry eyes    History of glaucoma    s/p   bilateral eye laser treatment  2007   History  of traumatic head injury 10/14/2012   Lupus (systemic lupus erythematosus) ([place])    Positive TB test    pt states cxr negative was tested due to treatment for rheumatoid arthritis since test +  , was told to take medication for tb for 9 months before going back on humira injection   Rheumatoid arthritis(714.0)     Dr. Dondra Spry, [facility]   Sjogren's disease [facility])    SUI (stress urinary incontinence, female)    Wears glasses     Family History  Problem Relation Age of Onset   CVA Mother        TIA   Hypertension Mother    Cystic fibrosis Mother    Stomach cancer Father    Diabetes Father    Hypertension Father    Hypertension Brother    Hypertension Brother    Healthy Daughter    Healthy Daughter    Past Surgical History:  Procedure Laterality Date   CARPAL TUNNEL RELEASE Bilateral 2007   CERVICAL CONIZATION W/BX  [place]  x2  last one 1999   GLAUCOMA SURGERY Bilateral 2007   laser   HYSTEROSCOPY WITH D & C  03-02-2010   MASS EXCISION Left 05/21/2013   Procedure: EXCISION MASS LEFT LONG FINGER;  Surgeon: Linna Hoff, MD;  Location: [facility];  Service: Orthopedics;  Laterality: Left;   SHOULDER ARTHROSCOPY WITH OPEN ROTATOR CUFF REPAIR Right 2012   TENOSYNOVECTOMY Left 05/21/2013   Procedure: LEFT INDEX FINGER TENOSYNOVECTOMY;  Surgeon: Linna Hoff, MD;  Location: [facility];  Service: Orthopedics;  Laterality: Left;   TRIGGER FINGER RELEASE  2009   left middle finger   TYMPANOPLASTY Left 1993   Social History   Social History Narrative   Lives with Rise Mu 87yo, Daughter, April 21yo, Pets [place] and [place].    Immunization History  Administered Date(s) Administered   Influenza Split 10/02/2011   Influenza Whole 11/05/2008, 10/24/2009   Influenza, High Dose Seasonal PF 09/23/2014   Influenza,inj,Quad PF,6+ Mos 10/14/2012, 10/15/2016, 09/24/2017, 10/23/2018, 10/06/2020   Influenza-Unspecified 10/02/2011, 10/14/2012, 11/23/2015, 10/15/2016, 09/24/2017, 10/07/2019   PFIZER(Purple Top)SARS-COV-2 Vaccination 03/16/2019, 04/06/2019, 09/08/2019, 03/10/2020   PPD Test 02/11/2013   Pfizer Covid-19 Vaccine Bivalent Booster 100yr & up 12/06/2020   Pneumococcal Conjugate-13 07/27/2016   Pneumococcal Polysaccharide-23  12/06/2020   Td 08/06/2002   Tdap 02/06/2019   Zoster Recombinat (Shingrix) 04/13/2020, 06/13/2020     Objective: Vital Signs: BP 111/75   Pulse 71   Ht '5\' 3"'$  (1.6 m)   Wt 159 lb (72.1 kg)   LMP 05/05/2014 (Approximate)   BMI 28.17 kg/m    Physical Exam Vitals and nursing note reviewed.  Constitutional:      Appearance: She is well-developed.  HENT:     Head: Normocephalic and atraumatic.  Eyes:     Conjunctiva/sclera: Conjunctivae normal.  Cardiovascular:     Rate and Rhythm: Normal rate and regular rhythm.     Heart sounds: Normal heart sounds.  Pulmonary:     Effort: Pulmonary effort is normal.     Breath sounds: Normal breath sounds.  Abdominal:     General: Bowel sounds are normal.     Palpations: Abdomen is soft.  Musculoskeletal:     Cervical back: Normal range of motion.  Skin:    General: Skin is warm and dry.     Capillary Refill: Capillary refill takes less than 2 seconds.  Neurological:     Mental  Status: She is alert and oriented to person, place, and time.  Psychiatric:        Behavior: Behavior normal.      Musculoskeletal Exam: C-spine has good range of motion with no discomfort.  No midline spinal tenderness or SI joint tenderness.  Shoulder joints have good range of motion with no discomfort.  Tenderness over the left lateral epicondyle.  No tenderness or synovitis over wrist joints.  She has tenderness and synovitis of the left second and third MCPs and the left third and fourth PIP joints.  Incomplete fist formation bilaterally.  Hip joints have good range of motion with no groin pain.  Knee joints have good range of motion with some discomfort and stiffness bilaterally.  No warmth or effusion of knee joints noted.  Ankle joints have good range of motion with no tenderness or joint swelling.  No tenderness over MTP joints currently.  CDAI Exam: CDAI Score: 12.2  Patient Global: 7 mm; Provider Global: 5 mm Swollen: 4 ; Tender: 7  Joint Exam  07/19/2021      Right  Left  Elbow      Tender  MCP 2     Swollen Tender  MCP 3     Swollen Tender  PIP 3     Swollen Tender  PIP 4     Swollen Tender  Knee   Tender   Tender     Investigation: No additional findings.  Imaging: No results found.  Recent Labs: Lab Results  Component Value Date   WBC 5.0 04/13/2021   HGB 14.5 04/13/2021   PLT 205 04/13/2021   NA 139 04/13/2021   K 4.7 04/13/2021   CL 109 04/13/2021   CO2 25 04/13/2021   GLUCOSE 89 04/13/2021   BUN 22 04/13/2021   CREATININE 0.82 04/13/2021   BILITOT 0.5 04/13/2021   ALKPHOS 73 04/11/2012   AST 25 04/13/2021   ALT 25 04/13/2021   PROT 7.1 04/13/2021   ALBUMIN 4.1 04/11/2012   CALCIUM 9.3 04/13/2021   GFRAA 115 03/22/2020   QFTBGOLDPLUS NEGATIVE 04/13/2021    Speciality Comments: No specialty comments available.  Procedures:  No procedures performed Allergies: Cefzil [cefprozil], Macrobid [nitrofurantoin], and Sulfa antibiotics   Assessment / Plan:     Visit Diagnoses: Rheumatoid arthritis involving multiple sites with positive rheumatoid factor ([place]) -  +RF,+anti-CCP, +14-3-3n, Diagnosed in 2006: Patient presents today with tenderness and synovitis of the left second and third MCPs and left third and fourth PIP joints.  Incomplete fist formation bilaterally.  She has been experiencing more frequent flares recently.   She attributes these flares to being on the reduced dose of methotrexate 4 tablets weekly.  She remains on Xeljanz 11 mg XR daily as prescribed.  She has not missed any doses recently.  She would like to try increasing the dose of methotrexate to 6 tablets weekly.  She can also increase the dose of folic acid to 2 mg daily.  She will remain on the current dose of Xeljanz as prescribed.  She was advised to notify us if she continues to have persistent pain and inflammation.  She will to return to the office in 3 months or sooner if needed to reassess.  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  High risk medication use - Xeljanz 11 mg XR daily, methotrexate 6 tablets by mouth once weekly and folic acid 2 mg daily.  CBC and CMP within normal limits on 04/13/2021.  Orders for CBC and CMP were released today.  She plans on having updated lab work in the morning when she is fasting.  A lipid panel was released. She will continue to require updated CBC and CMP every 3 months while on combination therapy.  TB gold was negative on 04/13/2021 and will continue to be monitored yearly.   - Plan: Lipid panel, CBC with Differential/Platelet, COMPLETE METABOLIC PANEL WITH GFR She has not had any recent infections.  Discussed the importance of holding methotrexate and Morrie Sheldon if she develops signs or symptoms of infection and to resume once the infection is completely cleared.  Sicca syndrome ([place]): She has ongoing sicca symptoms.  Unchanged.  Raynaud's phenomenon without gangrene: No digital ulcerations or signs of gangrene.  Not currently active.  Chondromalacia of both patellae: She has good range of motion of both knee joints on examination today.  Crepitus noted bilaterally.  No warmth or effusion noted.  Osteopenia of multiple sites - DEXA ordered by PCP  Other medical conditions are listed as follows:  Chronic laryngitis  Gastroesophageal reflux disease without esophagitis  Mixed hyperlipidemia -Future order for lipid panel was released.  She plans on returning in the morning while she is fasting to have updated lab work.  Plan: Lipid panel  Mild episode of recurrent major depressive disorder (HCC)  Heat intolerance - She has been experiencing increased heat intolerance and excessive sweating.  She has discussed the symptoms with her PCP.  TSH will be checked with her routine lab work.  Plan: TSH  Orders: Orders Placed This Encounter  Procedures   Lipid panel   CBC with Differential/Platelet   COMPLETE METABOLIC PANEL WITH GFR   TSH   Meds  ordered this encounter  Medications   methotrexate (RHEUMATREX) 2.5 MG tablet    Sig: TAKE 6 TABLETS BY MOUTH ONCE A  WEEK    Dispense:  72 tablet    Refill:  0      Follow-Up Instructions: Return in about 3 months (around 10/19/2021) for Rheumatoid arthritis.   Ofilia Neas, PA-C  Note - This record has been created using Dragon software.  Chart creation errors have been sought, but may not always  have been located. Such creation errors do not reflect on  the standard of medical care.

## 2021-07-13 NOTE — Telephone Encounter (Signed)
Per EOB, pt's amount billed is being applied towards her deductible.  Notified pt who verbalized understanding.  Nothing further needed at this time.

## 2021-07-19 ENCOUNTER — Ambulatory Visit: Payer: 59 | Admitting: Physician Assistant

## 2021-07-19 ENCOUNTER — Encounter: Payer: Self-pay | Admitting: Physician Assistant

## 2021-07-19 VITALS — BP 111/75 | HR 71 | Ht 63.0 in | Wt 159.0 lb

## 2021-07-19 DIAGNOSIS — I7300 Raynaud's syndrome without gangrene: Secondary | ICD-10-CM

## 2021-07-19 DIAGNOSIS — M3500 Sicca syndrome, unspecified: Secondary | ICD-10-CM | POA: Diagnosis not present

## 2021-07-19 DIAGNOSIS — F330 Major depressive disorder, recurrent, mild: Secondary | ICD-10-CM

## 2021-07-19 DIAGNOSIS — J370 Chronic laryngitis: Secondary | ICD-10-CM

## 2021-07-19 DIAGNOSIS — M2241 Chondromalacia patellae, right knee: Secondary | ICD-10-CM

## 2021-07-19 DIAGNOSIS — Z79899 Other long term (current) drug therapy: Secondary | ICD-10-CM

## 2021-07-19 DIAGNOSIS — M0630 Rheumatoid nodule, unspecified site: Secondary | ICD-10-CM | POA: Diagnosis not present

## 2021-07-19 DIAGNOSIS — R6889 Other general symptoms and signs: Secondary | ICD-10-CM

## 2021-07-19 DIAGNOSIS — M0579 Rheumatoid arthritis with rheumatoid factor of multiple sites without organ or systems involvement: Secondary | ICD-10-CM | POA: Diagnosis not present

## 2021-07-19 DIAGNOSIS — M8589 Other specified disorders of bone density and structure, multiple sites: Secondary | ICD-10-CM

## 2021-07-19 DIAGNOSIS — E782 Mixed hyperlipidemia: Secondary | ICD-10-CM

## 2021-07-19 DIAGNOSIS — M2242 Chondromalacia patellae, left knee: Secondary | ICD-10-CM

## 2021-07-19 DIAGNOSIS — K219 Gastro-esophageal reflux disease without esophagitis: Secondary | ICD-10-CM

## 2021-07-19 MED ORDER — METHOTREXATE 2.5 MG PO TABS: ORAL_TABLET | ORAL | 0 refills | Status: DC

## 2021-07-19 NOTE — Patient Instructions (Signed)
Standing Labs We placed an order today for your standing lab work.   Please have your standing labs drawn in September and every 3 months   If possible, please have your labs drawn 2 weeks prior to your appointment so that the provider can discuss your results at your appointment.  Please note that you may see your imaging and lab results in [place] before we have reviewed them. We may be awaiting multiple results to interpret others before contacting you. Please allow our office up to 72 hours to thoroughly review all of the results before contacting the office for clarification of your results.  We have open lab daily: Monday through Thursday from 1:30-4:30 PM and Friday from 1:30-4:00 PM at the office of Dr. Bo Merino, [facility].   Please be advised, all patients with office appointments requiring lab work will take precedent over walk-in lab work.  If possible, please come for your lab work on Monday and Friday afternoons, as you may experience shorter wait times. The office is located at [address], [place], [place], [place] [address] No appointment is necessary.   Labs are drawn by [facility]. Please bring your co-pay at the time of your lab draw.  You may receive a bill from [facility] for your lab work.  Please note if you are on Hydroxychloroquine and and an order has been placed for a Hydroxychloroquine level, you will need to have it drawn 4 hours or more after your last dose.  If you wish to have your labs drawn at another location, please call the office 24 hours in advance to send orders.  If you have any questions regarding directions or hours of operation,  please call [phone].   As a reminder, please drink plenty of water prior to coming for your lab work. Thanks!  If you have signs or symptoms of an infection or start antibiotics: First, call your PCP for workup of your infection. Hold your medication through the infection, until you complete  your antibiotics, and until symptoms resolve if you take the following: Injectable medication (Actemra, Benlysta, Cimzia, Cosentyx, Enbrel, Humira, Kevzara, Orencia, Remicade, Simponi, Stelara, Taltz, Tremfya) Methotrexate Leflunomide (Arava) Mycophenolate (Cellcept) Morrie Sheldon, Olumiant, or Rinvoq   Vaccines You are taking a medication(s) that can suppress your immune system.  The following immunizations are recommended: Flu annually Covid-19  Td/Tdap (tetanus, diphtheria, pertussis) every 10 years Pneumonia (Prevnar 15 then Pneumovax 23 at least 1 year apart.  Alternatively, can take Prevnar 20 without needing additional dose) Shingrix: 2 doses from 4 weeks to 6 months apart  Please check with your PCP to make sure you are up to date.

## 2021-07-20 ENCOUNTER — Other Ambulatory Visit: Payer: Self-pay | Admitting: *Deleted

## 2021-07-20 MED ORDER — FOLIC ACID 1 MG PO TABS: 2.0000 mg | ORAL_TABLET | Freq: Every day | ORAL | 3 refills | Status: DC

## 2021-07-22 LAB — CBC WITH DIFFERENTIAL/PLATELET
Absolute Monocytes: 318 cells/uL (ref 200–950)
Basophils Absolute: 18 cells/uL (ref 0–200)
Basophils Relative: 0.3 %
Eosinophils Absolute: 372 cells/uL (ref 15–500)
Eosinophils Relative: 6.2 %
HCT: 43.6 % (ref 35.0–45.0)
Hemoglobin: 14.3 g/dL (ref 11.7–15.5)
Lymphs Abs: 708 cells/uL — ABNORMAL LOW (ref 850–3900)
MCH: 30.8 pg (ref 27.0–33.0)
MCHC: 32.8 g/dL (ref 32.0–36.0)
MCV: 94 fL (ref 80.0–100.0)
MPV: 10.6 fL (ref 7.5–12.5)
Monocytes Relative: 5.3 %
Neutro Abs: 4584 cells/uL (ref 1500–7800)
Neutrophils Relative %: 76.4 %
Platelets: 203 10*3/uL (ref 140–400)
RBC: 4.64 10*6/uL (ref 3.80–5.10)
RDW: 13.4 % (ref 11.0–15.0)
Total Lymphocyte: 11.8 %
WBC: 6 10*3/uL (ref 3.8–10.8)

## 2021-07-22 LAB — COMPLETE METABOLIC PANEL WITH GFR
AG Ratio: 1.5 (calc) (ref 1.0–2.5)
ALT: 18 U/L (ref 6–29)
AST: 20 U/L (ref 10–35)
Albumin: 4.3 g/dL (ref 3.6–5.1)
Alkaline phosphatase (APISO): 82 U/L (ref 37–153)
BUN: 22 mg/dL (ref 7–25)
CO2: 25 mmol/L (ref 20–32)
Calcium: 9.3 mg/dL (ref 8.6–10.4)
Chloride: 109 mmol/L (ref 98–110)
Creat: 0.82 mg/dL (ref 0.50–1.03)
Globulin: 2.9 g/dL (calc) (ref 1.9–3.7)
Glucose, Bld: 95 mg/dL (ref 65–99)
Potassium: 4.3 mmol/L (ref 3.5–5.3)
Sodium: 143 mmol/L (ref 135–146)
Total Bilirubin: 0.3 mg/dL (ref 0.2–1.2)
Total Protein: 7.2 g/dL (ref 6.1–8.1)
eGFR: 83 mL/min/{1.73_m2} (ref >=60)

## 2021-07-22 LAB — LIPID PANEL
Cholesterol: 206 mg/dL — ABNORMAL HIGH (ref <200)
HDL: 73 mg/dL (ref >=50)
LDL Cholesterol (Calc): 110 mg/dL (calc) — ABNORMAL HIGH
Non-HDL Cholesterol (Calc): 133 mg/dL (calc) — ABNORMAL HIGH (ref <130)
Total CHOL/HDL Ratio: 2.8 (calc) (ref <5.0)
Triglycerides: 118 mg/dL (ref <150)

## 2021-07-22 LAB — TSH: TSH: 2.57 mIU/L (ref 0.40–4.50)

## 2021-07-25 NOTE — Progress Notes (Signed)
Absolute lymphocytes are borderline low. We will continue to monitor. Rest of CBC Wnl . CMP WNL.  TSH WNL.   Total cholesterol is elevated.  LDL is slightly elevated-110.  Please notify the patient and forward results to PCP.

## 2021-07-31 ENCOUNTER — Other Ambulatory Visit: Payer: Self-pay | Admitting: Otolaryngology

## 2021-08-04 ENCOUNTER — Ambulatory Visit: Payer: 59 | Admitting: Obstetrics and Gynecology

## 2021-08-29 ENCOUNTER — Ambulatory Visit (INDEPENDENT_AMBULATORY_CARE_PROVIDER_SITE_OTHER): Payer: 59 | Admitting: Family Medicine

## 2021-08-29 VITALS — BP 138/86 | HR 94 | Wt 161.0 lb

## 2021-08-29 DIAGNOSIS — N3000 Acute cystitis without hematuria: Secondary | ICD-10-CM

## 2021-08-29 DIAGNOSIS — R399 Unspecified symptoms and signs involving the genitourinary system: Secondary | ICD-10-CM

## 2021-08-29 LAB — POCT URINALYSIS DIP (CLINITEK)
Bilirubin, UA: NEGATIVE
Blood, UA: NEGATIVE
Glucose, UA: NEGATIVE mg/dL
Ketones, POC UA: NEGATIVE mg/dL
Nitrite, UA: NEGATIVE
POC PROTEIN,UA: NEGATIVE
Spec Grav, UA: 1.01 (ref 1.010–1.025)
Urobilinogen, UA: 0.2 E.U./dL
pH, UA: 7 (ref 5.0–8.0)

## 2021-08-29 LAB — POCT UA - MICROSCOPIC ONLY

## 2021-08-29 MED ORDER — CEPHALEXIN 500 MG PO CAPS: 500.0000 mg | ORAL_CAPSULE | Freq: Three times a day (TID) | ORAL | 0 refills | Status: DC

## 2021-08-29 NOTE — Progress Notes (Signed)
  Date of Visit: 08/29/2021   SUBJECTIVE:   HPI:  Jenna Gordon presents today for a same day appointment to discuss UTI symptoms.   Began having dysuria 5 days ago. Has also had cloudy urine and malodorous urine. Last night began having suprapubic pain. Thinks she has a UTI. Denies back pain or fever. Eating and drinking well. Has had UTIs in the past and is not entirely sure what she has taken. Has known allergy to sulfa drugs (hives). Thinks many years ago she reacted to cefprozil, but since then has taken keflex and also had IV ancef without any issues. May have reacted in past to nitrofurantoin as well, she is not sure.  OBJECTIVE:   BP 138/86   Pulse 94   Wt 161 lb (73 kg)   LMP 05/05/2014 (Approximate)   SpO2 96%   BMI 28.52 kg/m  Gen: no acute distress, pleasant, cooperative, well appearing HEENT: normocephalic, atraumatic  Lungs: normal resp effort Abdomen: mild suprapubic tenderness with palpation.  Back: no CVA tenderness bilaterally  Neuro: alert, speech normal, gait normal  ASSESSMENT/PLAN:   UTI symptoms UA with + leuks and moderate bacteria on urine microscopy. Symptoms classic for lower UTI. No signs of upper urinary tract disease. Given immunocompromised state will treat with keflex '500mg'$  three times daily for 7 days. Has tolerated keflex in past despite possible very remote reaction to cefprozil. Send urine for culture Follow up if not improving. Patient agreeable to this plan and appreciative.  FOLLOW UP: Follow up as needed if symptoms worsen or fail to improve.    [place]. Ardelia Mems, [place]

## 2021-08-29 NOTE — Patient Instructions (Signed)
Urinary Tract Infection, Adult  A urinary tract infection (UTI) is an infection of any part of the urinary tract. The urinary tract includes the kidneys, ureters, bladder, and urethra. These organs make, store, and get rid of urine in the body. An upper UTI affects the ureters and kidneys. A lower UTI affects the bladder and urethra. What are the causes? Most urinary tract infections are caused by bacteria in your genital area around your urethra, where urine leaves your body. These bacteria grow and cause inflammation of your urinary tract. What increases the risk? You are more likely to develop this condition if: You have a urinary catheter that stays in place. You are not able to control when you urinate or have a bowel movement (incontinence). You are female and you: Use a spermicide or diaphragm for birth control. Have low estrogen levels. Are pregnant. You have certain genes that increase your risk. You are sexually active. You take antibiotic medicines. You have a condition that causes your flow of urine to slow down, such as: An enlarged prostate, if you are female. Blockage in your urethra. A kidney stone. A nerve condition that affects your bladder control (neurogenic bladder). Not getting enough to drink, or not urinating often. You have certain medical conditions, such as: Diabetes. A weak disease-fighting system (immunesystem). Sickle cell disease. Gout. Spinal cord injury. What are the signs or symptoms? Symptoms of this condition include: Needing to urinate right away (urgency). Frequent urination. This may include small amounts of urine each time you urinate. Pain or burning with urination. Blood in the urine. Urine that smells bad or unusual. Trouble urinating. Cloudy urine. Vaginal discharge, if you are female. Pain in the abdomen or the lower back. You may also have: Vomiting or a decreased appetite. Confusion. Irritability or tiredness. A fever or  chills. Diarrhea. The first symptom in older adults may be confusion. In some cases, they may not have any symptoms until the infection has worsened. How is this diagnosed? This condition is diagnosed based on your medical history and a physical exam. You may also have other tests, including: Urine tests. Blood tests. Tests for STIs (sexually transmitted infections). If you have had more than one UTI, a cystoscopy or imaging studies may be done to determine the cause of the infections. How is this treated? Treatment for this condition includes: Antibiotic medicine. Over-the-counter medicines to treat discomfort. Drinking enough water to stay hydrated. If you have frequent infections or have other conditions such as a kidney stone, you may need to see a health care provider who specializes in the urinary tract (urologist). In rare cases, urinary tract infections can cause sepsis. Sepsis is a life-threatening condition that occurs when the body responds to an infection. Sepsis is treated in the hospital with IV antibiotics, fluids, and other medicines. Follow these instructions at home:  Medicines Take over-the-counter and prescription medicines only as told by your health care provider. If you were prescribed an antibiotic medicine, take it as told by your health care provider. Do not stop using the antibiotic even if you start to feel better. General instructions Make sure you: Empty your bladder often and completely. Do not hold urine for long periods of time. Empty your bladder after sex. Wipe from front to back after urinating or having a bowel movement if you are female. Use each tissue only one time when you wipe. Drink enough fluid to keep your urine pale yellow. Keep all follow-up visits. This is important. Contact a health   care provider if: Your symptoms do not get better after 1-2 days. Your symptoms go away and then return. Get help right away if: You have severe pain in  your back or your lower abdomen. You have a fever or chills. You have nausea or vomiting. Summary A urinary tract infection (UTI) is an infection of any part of the urinary tract, which includes the kidneys, ureters, bladder, and urethra. Most urinary tract infections are caused by bacteria in your genital area. Treatment for this condition often includes antibiotic medicines. If you were prescribed an antibiotic medicine, take it as told by your health care provider. Do not stop using the antibiotic even if you start to feel better. Keep all follow-up visits. This is important. This information is not intended to replace advice given to you by your health care provider. Make sure you discuss any questions you have with your health care provider. Document Revised: 08/21/2019 Document Reviewed: 08/21/2019 Elsevier Patient Education  2023 Elsevier Inc.  

## 2021-08-30 ENCOUNTER — Ambulatory Visit: Payer: 59

## 2021-08-31 LAB — URINE CULTURE

## 2021-09-14 ENCOUNTER — Other Ambulatory Visit: Payer: Self-pay | Admitting: Physician Assistant

## 2021-09-14 NOTE — Telephone Encounter (Signed)
Next Visit: 10/25/2021  Last Visit: 07/19/2021  Last Fill: 05/29/2021  XK:GYJEHUDJSH arthritis involving multiple sites with positive rheumatoid factor   Current Dose per office note 07/19/2021: Morrie Sheldon 11 mg XR daily  Labs: 07/21/2021 Absolute lymphocytes are borderline low. We will continue to monitor. Rest of CBC Wnl . CMP WNL.  TB Gold: 04/13/2021 Neg    Okay to refill Morrie Sheldon?

## 2021-09-28 ENCOUNTER — Other Ambulatory Visit: Payer: Self-pay | Admitting: Family Medicine

## 2021-09-28 DIAGNOSIS — Z1231 Encounter for screening mammogram for malignant neoplasm of breast: Secondary | ICD-10-CM

## 2021-10-11 NOTE — Progress Notes (Signed)
Office Visit Note  Patient: Jenna Gordon             Date of Birth: [birth year]           MRN: [id number]             PCP: Leeanne Rio, MD Referring: Leeanne Rio, MD Visit Date: 10/25/2021 Occupation: '@GUAROCC'$ @  Subjective:  Medication management  History of Present Illness: Jenna Gordon is a 56 y.o. female with history of seropositive rheumatoid arthritis and osteoarthritis.  She has been taking Xeljanz 11 mg XR daily and methotrexate 6 tablets p.o. weekly with folic acid 2 mg a day.  She underwent turbinate reduction and deviated nasal septum surgery on September 27.  She states she stopped methotrexate and Xeljanz prior to the surgery and she is holding it currently.  She had the COVID-19 vaccine 3 days ago.  She will be holding both medications for another week.  She states she has been experiencing some stiffness in her hands now in her feet.  She has not noticed any joint swelling.  She believes her symptoms are quite well controlled on the combination of both medications.  She continues to have dry eyes.  She states with the surgery she has been experiencing dry mouth as well.  She has some discomfort in her right knee joint.  Activities of Daily Living:  Patient reports morning stiffness for a couple of hours.   Patient Reports nocturnal pain.  Difficulty dressing/grooming: Denies Difficulty climbing stairs: Denies Difficulty getting out of chair: Denies Difficulty using hands for taps, buttons, cutlery, and/or writing: Reports  Review of Systems  Constitutional:  Positive for fatigue.  HENT:  Positive for mouth dryness. Negative for mouth sores.   Eyes:  Positive for dryness.  Respiratory:  Negative for shortness of breath.   Cardiovascular:  Negative for chest pain and palpitations.  Gastrointestinal:  Negative for blood in stool, constipation and diarrhea.  Endocrine: Positive for increased urination.  Genitourinary:  Negative for involuntary urination.   Musculoskeletal:  Positive for joint pain, joint pain and morning stiffness. Negative for gait problem, joint swelling, myalgias, muscle weakness, muscle tenderness and myalgias.  Skin:  Positive for sensitivity to sunlight. Negative for color change, rash and hair loss.  Allergic/Immunologic: Positive for susceptible to infections.  Neurological:  Positive for headaches. Negative for dizziness.  Hematological:  Negative for swollen glands.  Psychiatric/Behavioral:  Negative for depressed mood and sleep disturbance. The patient is not nervous/anxious.     PMFS History:  Patient Active Problem List   Diagnosis Date Noted   Nasal congestion 10/18/2021   Nasal septal deviation 10/18/2021   Chronic bronchitis ([place]) 03/06/2021   Hoarseness 03/06/2021   Stress incontinence 12/06/2020   Rib pain on right side 04/13/2020   High risk HPV infection 12/10/2019   Mouth lesion 06/01/2019   Depression, recurrent ([place]) 06/01/2019   Corneal irritation of both eyes 03/04/2019   Fibroid uterus 06/20/2018   Grade II internal hemorrhoids 06/20/2018   Mixed hyperlipidemia 03/14/2016   Environmental allergies 03/14/2016   Gastroesophageal reflux disease without esophagitis 02/24/2015   Osteopenia 02/24/2015   Rash 07/14/2014   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   Raynaud phenomenon 04/11/2012   ANEMIA, IRON DEFICIENCY 09/08/2009   SJOGREN'S SYNDROME 01/06/2008   Rheumatoid arthritis of multiple sites with negative rheumatoid factor ([place]) 04/09/2007    Past Medical History:  Diagnosis Date   Amnesia memory loss 10/14/2012   Anemia 2009   Arthritis    Dry  eyes    GERD (gastroesophageal reflux disease)    History of glaucoma    s/p   bilateral eye laser treatment  2007   History of traumatic head injury 10/14/2012   Lupus (systemic lupus erythematosus) (HCC)    Positive TB test    pt states cxr negative was tested due to treatment for rheumatoid arthritis since test +  , was told to take medication for  tb for 9 months before going back on humira injection   Rheumatoid arthritis(714.0)    Dr. Dondra Spry, [facility]   Sjogren's disease [facility])    SUI (stress urinary incontinence, female)    Wears glasses     Family History  Problem Relation Age of Onset   CVA Mother        TIA   Hypertension Mother    Cystic fibrosis Mother    Stomach cancer Father    Diabetes Father    Hypertension Father    Hypertension Brother    Hypertension Brother    Healthy Daughter    Healthy Daughter    Past Surgical History:  Procedure Laterality Date   CARPAL TUNNEL RELEASE Bilateral 2007   CERVICAL CONIZATION W/BX  [place]  x2  last one 1999   GLAUCOMA SURGERY Bilateral 2007   laser   HYSTEROSCOPY WITH D & C  03-02-2010   MASS EXCISION Left 05/21/2013   Procedure: EXCISION MASS LEFT LONG FINGER;  Surgeon: Linna Hoff, MD;  Location: [place];  Service: Orthopedics;  Laterality: Left;   NASAL SEPTOPLASTY W/ TURBINOPLASTY Bilateral 10/18/2021   Procedure: NASAL SEPTOPLASTY WITH TURBINATE REDUCTION AND REPAIR OF NASAL VALVE COLLAPSE;  Surgeon: Jason Coop, DO;  Location: [place];  Service: ENT;  Laterality: Bilateral;   SHOULDER ARTHROSCOPY WITH OPEN ROTATOR CUFF REPAIR Right 2012   TENOSYNOVECTOMY Left 05/21/2013   Procedure: LEFT INDEX FINGER TENOSYNOVECTOMY;  Surgeon: Linna Hoff, MD;  Location: [place];  Service: Orthopedics;  Laterality: Left;   TRIGGER FINGER RELEASE  2009   left middle finger   TYMPANOPLASTY Left 1993   Social History   Social History Narrative   Lives with Rise Mu 87yo, Daughter, April 21yo, Pets [place] and [place].    Immunization History  Administered Date(s) Administered   Influenza Split 10/02/2011   Influenza Whole 11/05/2008, 10/24/2009   Influenza, High Dose Seasonal PF 09/23/2014   Influenza,inj,Quad PF,6+ Mos 10/14/2012, 10/15/2016, 09/24/2017, 10/23/2018, 10/06/2020    Influenza-Unspecified 10/02/2011, 10/14/2012, 11/23/2015, 10/15/2016, 09/24/2017, 10/07/2019   PFIZER(Purple Top)SARS-COV-2 Vaccination 03/16/2019, 04/06/2019, 09/08/2019, 03/10/2020   PPD Test 02/11/2013   Pfizer Covid-19 Vaccine Bivalent Booster 32yr & up 12/06/2020   Pneumococcal Conjugate-13 07/27/2016   Pneumococcal Polysaccharide-23 12/06/2020   Td 08/06/2002   Tdap 02/06/2019   Zoster Recombinat (Shingrix) 04/13/2020, 06/13/2020     Objective: Vital Signs: BP 112/79 (BP Location: Left Arm, Patient Position: Sitting, Cuff Size: Normal)   Pulse 84   Resp 14   Ht '5\' 3"'$  (1.6 m)   Wt 159 lb 12.8 oz (72.5 kg)   LMP 05/05/2014 (Approximate)   BMI 28.31 kg/m    Physical Exam Vitals and nursing note reviewed.  Constitutional:      Appearance: She is well-developed.  HENT:     Head: Normocephalic and atraumatic.  Eyes:     Conjunctiva/sclera: Conjunctivae normal.  Cardiovascular:     Rate and Rhythm: Normal rate and regular rhythm.     Heart sounds: Normal heart  sounds.  Pulmonary:     Effort: Pulmonary effort is normal.     Breath sounds: Normal breath sounds.  Abdominal:     General: Bowel sounds are normal.     Palpations: Abdomen is soft.  Musculoskeletal:     Cervical back: Normal range of motion.  Lymphadenopathy:     Cervical: No cervical adenopathy.  Skin:    General: Skin is warm and dry.     Capillary Refill: Capillary refill takes less than 2 seconds.  Neurological:     Mental Status: She is alert and oriented to person, place, and time.  Psychiatric:        Behavior: Behavior normal.      Musculoskeletal Exam: C-spine was in good range of motion.  Shoulder joints, elbow joints, wrist joints, MCPs PIPs and DIPs with good range of motion.  She had bilateral MCP thickening.  She had ulnar deviation.  She had bilateral PIP and DIP thickening.  Hip joints and knee joints were in good range of motion.  She had no tenderness over ankles or MTPs.  CDAI  Exam: CDAI Score: -- Patient Global: 3 mm; Provider Global: 3 mm Swollen: --; Tender: -- Joint Exam 10/25/2021   No joint exam has been documented for this visit   There is currently no information documented on the homunculus. Go to the Rheumatology activity and complete the homunculus joint exam.  Investigation: No additional findings.  Imaging: MM 3D SCREEN BREAST BILATERAL  Result Date: 10/16/2021 CLINICAL DATA:  Screening. EXAM: DIGITAL SCREENING BILATERAL MAMMOGRAM WITH TOMOSYNTHESIS AND CAD TECHNIQUE: Bilateral screening digital craniocaudal and mediolateral oblique mammograms were obtained. Bilateral screening digital breast tomosynthesis was performed. The images were evaluated with computer-aided detection. COMPARISON:  Previous exam(s). ACR Breast Density Category c: The breast tissue is heterogeneously dense, which may obscure small masses. FINDINGS: There are no findings suspicious for malignancy. IMPRESSION: No mammographic evidence of malignancy. A result letter of this screening mammogram will be mailed directly to the patient. RECOMMENDATION: Screening mammogram in one year. (Code:SM-B-01Y) BI-RADS CATEGORY  1: Negative. Electronically Signed   By: Lovey Newcomer M.D.   On: 10/16/2021 13:44    Recent Labs: Lab Results  Component Value Date   WBC 5.1 10/18/2021   HGB 13.9 10/18/2021   PLT 197 10/18/2021   NA 143 07/21/2021   K 4.3 07/21/2021   CL 109 07/21/2021   CO2 25 07/21/2021   GLUCOSE 95 07/21/2021   BUN 22 07/21/2021   CREATININE 0.82 07/21/2021   BILITOT 0.3 07/21/2021   ALKPHOS 73 04/11/2012   AST 20 07/21/2021   ALT 18 07/21/2021   PROT 7.2 07/21/2021   ALBUMIN 4.1 04/11/2012   CALCIUM 9.3 07/21/2021   GFRAA 115 03/22/2020   QFTBGOLDPLUS NEGATIVE 04/13/2021    Speciality Comments: No specialty comments available.  Procedures:  No procedures performed Allergies: Cefzil [cefprozil], Macrobid [nitrofurantoin], and Sulfa antibiotics   Assessment /  Plan:     Visit Diagnoses: Rheumatoid arthritis involving multiple sites with positive rheumatoid factor ([place]) - +RF,+anti-CCP, +14-3-3n, Diagnosed in 2006: Patient has been doing well on the combination of Xeljanz and methotrexate.  She has been off both medications for almost a week due to recent sinus surgery.  She has synovial thickening over MCPs and PIP joints.  No synovitis was noted.  She states she has been experiencing some discomfort in her hands and her right knee.  No warmth swelling or effusion was noted.  She had COVID-19 vaccine 3 days  ago.  She plans to hold both medications for a week and will resume both medications.  Rheumatoid nodulosis (HCC)-resolved.  High risk medication use - Xeljanz 11 mg XR daily, methotrexate 6 tablets by mouth once weekly and folic acid 2 mg daily.  -CBC from October 18, 2021 was reviewed which was within normal limits.  We will check CMP today.  Plan: COMPLETE METABOLIC PANEL WITH GFR.  TB gold was negative on April 13, 2021.  We will check labs again in January and then every 3 months.  Information about immunization was placed in the AVS.  She was advised to hold Xeljanz and methotrexate if she develops an infection and resume after the infection resolves.  Increased risk of blood clots, heart attack, stroke and cardiovascular events in association to [place] were discussed and a handout was placed in the AVS.  Sicca syndrome (HCC)-she has been spearing seeing increased dry mouth after the sinus surgery.  She continues to have dry eyes.  Raynaud's phenomenon without gangrene-currently not active.  Chondromalacia of both patellae-she has experiencing knee joint discomfort.  Lower extremity muscle strengthening exercises were discussed.  Osteopenia of multiple sites - DEXA ordered by PCP  Chronic laryngitis  Gastroesophageal reflux disease without esophagitis  Mixed hyperlipidemia  Mild episode of recurrent major depressive disorder (HCC)  Heat  intolerance  Orders: Orders Placed This Encounter  Procedures   COMPLETE METABOLIC PANEL WITH GFR   Meds ordered this encounter  Medications   methotrexate (RHEUMATREX) 2.5 MG tablet    Sig: TAKE 6 TABLETS BY MOUTH ONCE A  WEEK    Dispense:  72 tablet    Refill:  0   folic acid (FOLVITE) 1 MG tablet    Sig: Take 1 tablet (1 mg total) by mouth daily.    Dispense:  90 tablet    Refill:  3     Follow-Up Instructions: Return in about 5 months (around 03/26/2022) for Rheumatoid arthritis.   Bo Merino, MD  Note - This record has been created using Editor, commissioning.  Chart creation errors have been sought, but may not always  have been located. Such creation errors do not reflect on  the standard of medical care.

## 2021-10-13 ENCOUNTER — Ambulatory Visit
Admission: RE | Admit: 2021-10-13 | Discharge: 2021-10-13 | Disposition: A | Discharge status: Home / Self Care | Payer: 59 | Source: Ambulatory Visit | Attending: Family Medicine | Admitting: Family Medicine

## 2021-10-13 DIAGNOSIS — Z1231 Encounter for screening mammogram for malignant neoplasm of breast: Secondary | ICD-10-CM

## 2021-10-17 ENCOUNTER — Encounter (HOSPITAL_COMMUNITY): Payer: Self-pay | Admitting: Otolaryngology

## 2021-10-17 ENCOUNTER — Other Ambulatory Visit: Payer: Self-pay

## 2021-10-17 NOTE — Progress Notes (Addendum)
Jenna Gordon denies chest pain or shortness of breath.  Patient denies having any s/s of Covid in her household, also denies any known exposure to Covid.   Jenna Gordon PCP is Dr. Chrisandra Netters, RA Dr. Is Dr. Velta Addison.

## 2021-10-18 ENCOUNTER — Encounter (HOSPITAL_COMMUNITY)
Admission: RE | Disposition: A | Discharge status: Home / Self Care | Payer: Self-pay | Source: Ambulatory Visit | Attending: Otolaryngology

## 2021-10-18 ENCOUNTER — Other Ambulatory Visit (HOSPITAL_COMMUNITY): Payer: Self-pay

## 2021-10-18 ENCOUNTER — Ambulatory Visit (HOSPITAL_COMMUNITY): Payer: 59 | Admitting: Anesthesiology

## 2021-10-18 ENCOUNTER — Ambulatory Visit (HOSPITAL_COMMUNITY)
Admission: RE | Admit: 2021-10-18 | Discharge: 2021-10-18 | Disposition: A | Discharge status: Home / Self Care | Payer: 59 | Source: Ambulatory Visit | Attending: Otolaryngology | Admitting: Otolaryngology

## 2021-10-18 ENCOUNTER — Ambulatory Visit (HOSPITAL_BASED_OUTPATIENT_CLINIC_OR_DEPARTMENT_OTHER): Payer: 59 | Admitting: Anesthesiology

## 2021-10-18 ENCOUNTER — Encounter (HOSPITAL_COMMUNITY): Payer: Self-pay | Admitting: Otolaryngology

## 2021-10-18 DIAGNOSIS — F32A Depression, unspecified: Secondary | ICD-10-CM | POA: Insufficient documentation

## 2021-10-18 DIAGNOSIS — R0981 Nasal congestion: Secondary | ICD-10-CM | POA: Diagnosis present

## 2021-10-18 DIAGNOSIS — K219 Gastro-esophageal reflux disease without esophagitis: Secondary | ICD-10-CM | POA: Insufficient documentation

## 2021-10-18 DIAGNOSIS — J342 Deviated nasal septum: Secondary | ICD-10-CM | POA: Diagnosis not present

## 2021-10-18 DIAGNOSIS — M329 Systemic lupus erythematosus, unspecified: Secondary | ICD-10-CM | POA: Insufficient documentation

## 2021-10-18 DIAGNOSIS — R490 Dysphonia: Secondary | ICD-10-CM | POA: Diagnosis not present

## 2021-10-18 DIAGNOSIS — J3489 Other specified disorders of nose and nasal sinuses: Secondary | ICD-10-CM

## 2021-10-18 DIAGNOSIS — M069 Rheumatoid arthritis, unspecified: Secondary | ICD-10-CM | POA: Diagnosis not present

## 2021-10-18 DIAGNOSIS — Z79899 Other long term (current) drug therapy: Secondary | ICD-10-CM | POA: Insufficient documentation

## 2021-10-18 HISTORY — PX: NASAL SEPTOPLASTY W/ TURBINOPLASTY: SHX2070

## 2021-10-18 HISTORY — DX: Unspecified osteoarthritis, unspecified site: M19.90

## 2021-10-18 HISTORY — DX: Gastro-esophageal reflux disease without esophagitis: K21.9

## 2021-10-18 LAB — CBC
HCT: 41.2 % (ref 36.0–46.0)
Hemoglobin: 13.9 g/dL (ref 12.0–15.0)
MCH: 31.7 pg (ref 26.0–34.0)
MCHC: 33.7 g/dL (ref 30.0–36.0)
MCV: 93.8 fL (ref 80.0–100.0)
Platelets: 197 10*3/uL (ref 150–400)
RBC: 4.39 MIL/uL (ref 3.87–5.11)
RDW: 13.4 % (ref 11.5–15.5)
WBC: 5.1 10*3/uL (ref 4.0–10.5)
nRBC: 0 % (ref 0.0–0.2)

## 2021-10-18 SURGERY — SEPTOPLASTY, NOSE, WITH NASAL TURBINATE REDUCTION
Anesthesia: General | Laterality: Bilateral

## 2021-10-18 MED ORDER — PROPOFOL 10 MG/ML IV BOLUS
INTRAVENOUS | Status: AC
  Filled 2021-10-18: qty 20

## 2021-10-18 MED ORDER — MUPIROCIN 2 % EX OINT
TOPICAL_OINTMENT | CUTANEOUS | Status: AC
  Filled 2021-10-18: qty 22

## 2021-10-18 MED ORDER — OXYCODONE HCL 5 MG PO TABS
ORAL_TABLET | ORAL | Status: AC
  Filled 2021-10-18: qty 1

## 2021-10-18 MED ORDER — ACETAMINOPHEN 500 MG PO TABS
1000.0000 mg | ORAL_TABLET | Freq: Once | ORAL | Status: AC
  Administered 2021-10-18: 1000 mg via ORAL
  Filled 2021-10-18: qty 2

## 2021-10-18 MED ORDER — LIDOCAINE 2% (20 MG/ML) 5 ML SYRINGE
INTRAMUSCULAR | Status: DC | PRN
  Administered 2021-10-18: 60 mg via INTRAVENOUS

## 2021-10-18 MED ORDER — OXYCODONE HCL 5 MG/5ML PO SOLN: 5.0000 mg | Freq: Once | ORAL | Status: AC | PRN

## 2021-10-18 MED ORDER — FENTANYL CITRATE (PF) 100 MCG/2ML IJ SOLN
INTRAMUSCULAR | Status: AC
  Filled 2021-10-18: qty 2

## 2021-10-18 MED ORDER — 0.9 % SODIUM CHLORIDE (POUR BTL) OPTIME
TOPICAL | Status: DC | PRN
  Administered 2021-10-18: 500 mL

## 2021-10-18 MED ORDER — FENTANYL CITRATE (PF) 100 MCG/2ML IJ SOLN
25.0000 ug | INTRAMUSCULAR | Status: DC | PRN
  Administered 2021-10-18: 25 ug via INTRAVENOUS

## 2021-10-18 MED ORDER — FENTANYL CITRATE (PF) 250 MCG/5ML IJ SOLN
INTRAMUSCULAR | Status: AC
  Filled 2021-10-18: qty 5

## 2021-10-18 MED ORDER — MIDAZOLAM HCL 2 MG/2ML IJ SOLN
INTRAMUSCULAR | Status: AC
  Filled 2021-10-18: qty 2

## 2021-10-18 MED ORDER — LIDOCAINE-EPINEPHRINE 1 %-1:100000 IJ SOLN
INTRAMUSCULAR | Status: DC | PRN
  Administered 2021-10-18: 10 mL

## 2021-10-18 MED ORDER — OXYMETAZOLINE HCL 0.05 % NA SOLN
NASAL | Status: DC | PRN
  Administered 2021-10-18: 1

## 2021-10-18 MED ORDER — HYDROCODONE-ACETAMINOPHEN 5-325 MG PO TABS
1.0000 | ORAL_TABLET | ORAL | 0 refills | Status: AC | PRN
  Filled 2021-10-18: qty 30, 5d supply, fill #0

## 2021-10-18 MED ORDER — MIDAZOLAM HCL 2 MG/2ML IJ SOLN
INTRAMUSCULAR | Status: DC | PRN
  Administered 2021-10-18: 2 mg via INTRAVENOUS

## 2021-10-18 MED ORDER — DEXAMETHASONE SODIUM PHOSPHATE 10 MG/ML IJ SOLN
INTRAMUSCULAR | Status: DC | PRN
  Administered 2021-10-18: 10 mg via INTRAVENOUS

## 2021-10-18 MED ORDER — SUCCINYLCHOLINE CHLORIDE 200 MG/10ML IV SOSY
PREFILLED_SYRINGE | INTRAVENOUS | Status: AC
  Filled 2021-10-18: qty 10

## 2021-10-18 MED ORDER — ROCURONIUM BROMIDE 10 MG/ML (PF) SYRINGE
PREFILLED_SYRINGE | INTRAVENOUS | Status: DC | PRN
  Administered 2021-10-18: 50 mg via INTRAVENOUS

## 2021-10-18 MED ORDER — FENTANYL CITRATE (PF) 250 MCG/5ML IJ SOLN
INTRAMUSCULAR | Status: DC | PRN
  Administered 2021-10-18 (×4): 50 ug via INTRAVENOUS

## 2021-10-18 MED ORDER — ONDANSETRON HCL 4 MG/2ML IJ SOLN
INTRAMUSCULAR | Status: DC | PRN
  Administered 2021-10-18: 4 mg via INTRAVENOUS

## 2021-10-18 MED ORDER — LACTATED RINGERS IV SOLN: INTRAVENOUS | Status: DC

## 2021-10-18 MED ORDER — CHLORHEXIDINE GLUCONATE 0.12 % MT SOLN
15.0000 mL | Freq: Once | OROMUCOSAL | Status: AC
  Administered 2021-10-18: 15 mL via OROMUCOSAL
  Filled 2021-10-18: qty 15

## 2021-10-18 MED ORDER — LIDOCAINE-EPINEPHRINE 1 %-1:100000 IJ SOLN
INTRAMUSCULAR | Status: AC
  Filled 2021-10-18: qty 1

## 2021-10-18 MED ORDER — PROPOFOL 10 MG/ML IV BOLUS
INTRAVENOUS | Status: DC | PRN
  Administered 2021-10-18: 50 mg via INTRAVENOUS
  Administered 2021-10-18: 150 mg via INTRAVENOUS

## 2021-10-18 MED ORDER — OXYMETAZOLINE HCL 0.05 % NA SOLN
NASAL | Status: AC
  Filled 2021-10-18: qty 30

## 2021-10-18 MED ORDER — AMISULPRIDE (ANTIEMETIC) 5 MG/2ML IV SOLN: 10.0000 mg | Freq: Once | INTRAVENOUS | Status: DC | PRN

## 2021-10-18 MED ORDER — SCOPOLAMINE 1 MG/3DAYS TD PT72
1.0000 | MEDICATED_PATCH | Freq: Once | TRANSDERMAL | Status: DC
  Administered 2021-10-18: 1.5 mg via TRANSDERMAL
  Filled 2021-10-18: qty 1

## 2021-10-18 MED ORDER — PHENYLEPHRINE 80 MCG/ML (10ML) SYRINGE FOR IV PUSH (FOR BLOOD PRESSURE SUPPORT)
PREFILLED_SYRINGE | INTRAVENOUS | Status: DC | PRN
  Administered 2021-10-18 (×3): 80 ug via INTRAVENOUS

## 2021-10-18 MED ORDER — VANCOMYCIN HCL IN DEXTROSE 1-5 GM/200ML-% IV SOLN
1000.0000 mg | INTRAVENOUS | Status: AC
  Administered 2021-10-18: 1000 mg via INTRAVENOUS
  Filled 2021-10-18: qty 200

## 2021-10-18 MED ORDER — SUGAMMADEX SODIUM 200 MG/2ML IV SOLN
INTRAVENOUS | Status: DC | PRN
  Administered 2021-10-18: 200 mg via INTRAVENOUS

## 2021-10-18 MED ORDER — DOCUSATE SODIUM 100 MG PO CAPS
100.0000 mg | ORAL_CAPSULE | Freq: Two times a day (BID) | ORAL | 0 refills | Status: AC | PRN
  Filled 2021-10-18: qty 14, 7d supply, fill #0

## 2021-10-18 MED ORDER — ATROPINE SULFATE 0.4 MG/ML IV SOLN
INTRAVENOUS | Status: AC
  Filled 2021-10-18: qty 1

## 2021-10-18 MED ORDER — OXYCODONE HCL 5 MG PO TABS
5.0000 mg | ORAL_TABLET | Freq: Once | ORAL | Status: AC | PRN
  Administered 2021-10-18: 5 mg via ORAL

## 2021-10-18 MED ORDER — ORAL CARE MOUTH RINSE: 15.0000 mL | Freq: Once | OROMUCOSAL | Status: AC

## 2021-10-18 SURGICAL SUPPLY — 27 items
BAG COUNTER SPONGE SURGICOUNT (BAG) ×2 IMPLANT
BAG SPNG CNTER NS LX DISP (BAG) ×1
BLADE INF TURB ROT M4 2 5PK (BLADE) ×2 IMPLANT
CANISTER SUCT 3000ML PPV (MISCELLANEOUS) ×2 IMPLANT
COAGULATOR SUCT SWTCH 10FR 6 (ELECTROSURGICAL) IMPLANT
DEFOGGER ANTIFOG KIT (MISCELLANEOUS) IMPLANT
ELECT REM PT RETURN 9FT ADLT (ELECTROSURGICAL)
ELECTRODE REM PT RTRN 9FT ADLT (ELECTROSURGICAL) IMPLANT
GAUZE SPONGE 2X2 8PLY STRL LF (GAUZE/BANDAGES/DRESSINGS) ×2 IMPLANT
GLOVE BIO SURGEON STRL SZ 6.5 (GLOVE) ×2 IMPLANT
GOWN STRL REUS W/ TWL LRG LVL3 (GOWN DISPOSABLE) ×4 IMPLANT
GOWN STRL REUS W/TWL LRG LVL3 (GOWN DISPOSABLE) ×2
KIT BASIN OR (CUSTOM PROCEDURE TRAY) ×2 IMPLANT
KIT TURNOVER KIT B (KITS) ×2 IMPLANT
NDL HYPO 25GX1X1/2 BEV (NEEDLE) ×2 IMPLANT
NEEDLE HYPO 25GX1X1/2 BEV (NEEDLE) ×1 IMPLANT
NS IRRIG 1000ML POUR BTL (IV SOLUTION) ×2 IMPLANT
PAD ARMBOARD 7.5X6 YLW CONV (MISCELLANEOUS) ×2 IMPLANT
PATTIES SURGICAL .5 X3 (DISPOSABLE) ×2 IMPLANT
SPLINT NASAL DOYLE BI-VL (GAUZE/BANDAGES/DRESSINGS) ×2 IMPLANT
SUT CHROMIC 4 0 P 3 18 (SUTURE) ×2 IMPLANT
SUT PLAIN 4 0 ~~LOC~~ 1 (SUTURE) ×2 IMPLANT
SUT SILK 2 0 SH (SUTURE) ×2 IMPLANT
TOWEL GREEN STERILE FF (TOWEL DISPOSABLE) ×2 IMPLANT
TRAY ENT MC OR (CUSTOM PROCEDURE TRAY) ×2 IMPLANT
TUBE SALEM SUMP 16 FR W/ARV (TUBING) ×2 IMPLANT
TUBING EXTENTION W/L.L. (IV SETS) ×2 IMPLANT

## 2021-10-18 NOTE — H&P (Signed)
Jenna Gordon is an 56 y.o. female.    Chief Complaint:  Nasal congestion  HPI: Patient presents today for planned elective procedure.  She denies any interval change in history since office visit on 07/14/2021:  Jenna Gordon is a 56 y.o. female who presents as a new consult, referred by Loanne Drilling, Hoffman Almyra Brace, MD, for evaluation and treatment of nasal congestion and hoarseness. Patient states that she has previously been seen by otolaryngology, and was counseled that she had any nasal septal deviation. She states that she has noticed increasing difficulty breathing out of her nose, especially on the left side and especially with exercise. She has been on Flonase in the past without any significant improvement in her breathing. She denies history of nasal trauma or injury. She also reports excessive daytime sleepiness, snoring and frequent nocturnal awakenings. She has not been tested for obstructive sleep apnea. She states that she has gained approximately 15 pounds in the last year. She also endorses decades long history of hoarseness. She is unsure as to when or how it started initially. She endorses history of GERD, and states that she is currently on daily pantoprazole. She has been seen by gastroenterology in the past, and underwent upper and lower endoscopy, at which time, H. pylori infection was noted. She has not been seen by gastroenterology for follow-up since that time. She experiences globus sensation frequently, and endorses frequent throat clearing. She tries to stay away from known triggers such as spicy foods, tomato based foods.    Past Medical History:  Diagnosis Date   Amnesia memory loss 10/14/2012   Anemia 2009   Arthritis    Dry eyes    GERD (gastroesophageal reflux disease)    History of glaucoma    s/p   bilateral eye laser treatment  2007   History of traumatic head injury 10/14/2012   Lupus (systemic lupus erythematosus) ([place])    Positive TB test    pt states cxr  negative was tested due to treatment for rheumatoid arthritis since test +  , was told to take medication for tb for 9 months before going back on humira injection   Rheumatoid arthritis(714.0)    Dr. Dondra Spry, [place]   Sjogren's disease [facility])    SUI (stress urinary incontinence, female)    Wears glasses     Past Surgical History:  Procedure Laterality Date   CARPAL TUNNEL RELEASE Bilateral 2007   CERVICAL CONIZATION W/BX  [place]  x2  last one [place] Bilateral 2007   laser   HYSTEROSCOPY WITH D & C  03-02-2010   MASS EXCISION Left 05/21/2013   Procedure: EXCISION MASS LEFT LONG FINGER;  Surgeon: Linna Hoff, MD;  Location: [place];  Service: Orthopedics;  Laterality: Left;   SHOULDER ARTHROSCOPY WITH OPEN ROTATOR CUFF REPAIR Right 2012   TENOSYNOVECTOMY Left 05/21/2013   Procedure: LEFT INDEX FINGER TENOSYNOVECTOMY;  Surgeon: Linna Hoff, MD;  Location: [place];  Service: Orthopedics;  Laterality: Left;   TRIGGER FINGER RELEASE  2009   left middle finger   TYMPANOPLASTY Left 1993    Family History  Problem Relation Age of Onset   CVA Mother        TIA   Hypertension Mother    Cystic fibrosis Mother    Stomach cancer Father    Diabetes Father    Hypertension Father    Hypertension Brother    Hypertension Brother  Healthy Daughter    Healthy Daughter     Social History:  reports that she has never smoked. She has never used smokeless tobacco. She reports current alcohol use. She reports that she does not use drugs.  Allergies:  Allergies  Allergen Reactions   Cefzil [Cefprozil] Other (See Comments)    Childhood allergy (unsure what occurred)   Macrobid [Nitrofurantoin] Hives   Sulfa Antibiotics Hives    Medications Prior to Admission  Medication Sig Dispense Refill   acetaminophen (TYLENOL) 500 MG tablet Take 1,000 mg by mouth every 6 (six) hours as needed for moderate  pain or mild pain.     folic acid (FOLVITE) 1 MG tablet Take 2 tablets (2 mg total) by mouth daily. (Patient taking differently: Take 1 mg by mouth daily.) 180 tablet 3   ibuprofen (ADVIL) 200 MG tablet Take 800 mg by mouth every 6 (six) hours as needed for mild pain or moderate pain (inflammation).     methotrexate (RHEUMATREX) 2.5 MG tablet TAKE 6 TABLETS BY MOUTH ONCE A  WEEK 72 tablet 0   Multiple Vitamins-Minerals (MULTIVITAMIN WITH MINERALS) tablet Take 1 tablet by mouth daily.     pantoprazole (PROTONIX) 40 MG tablet Take 1 tablet (40 mg total) by mouth daily. 90 tablet 3   sertraline (ZOLOFT) 100 MG tablet Take 1.5 tablets (150 mg total) by mouth daily. (Patient taking differently: Take 100 mg by mouth daily.) 135 tablet 3   XELJANZ XR 11 MG TB24 TAKE 1 TABLET BY MOUTH DAILY 30 tablet 2   Vibegron 75 MG TABS Take 75 mg by mouth daily. (Patient not taking: Reported on 10/11/2021) 90 tablet 4    Results for orders placed or performed during the hospital encounter of 10/18/21 (from the past 48 hour(s))  CBC per protocol     Status: None   Collection Time: 10/18/21 10:19 AM  Result Value Ref Range   WBC 5.1 4.0 - 10.5 K/uL   RBC 4.39 3.87 - 5.11 MIL/uL   Hemoglobin 13.9 12.0 - 15.0 g/dL   HCT 41.2 36.0 - 46.0 %   MCV 93.8 80.0 - 100.0 fL   MCH 31.7 26.0 - 34.0 pg   MCHC 33.7 30.0 - 36.0 g/dL   RDW 13.4 11.5 - 15.5 %   Platelets 197 150 - 400 K/uL   nRBC 0.0 0.0 - 0.2 %    Comment: Performed at [facility], [place] [address], [place], [place] [address]   No results found.  ROS: ROS  Blood pressure 112/75, pulse 67, temperature 97.6 F (36.4 C), temperature source Oral, resp. rate 18, height '5\' 3"'$  (1.6 m), weight 71.7 kg, last menstrual period 05/05/2014, SpO2 100 %.  PHYSICAL EXAM: Physical Exam Constitutional:      Appearance: Normal appearance.  HENT:     Right Ear: External ear normal.     Left Ear: External ear normal.     Mouth/Throat:     Mouth: Mucous membranes  are moist.  Pulmonary:     Effort: Pulmonary effort is normal.  Neurological:     General: No focal deficit present.     Mental Status: She is alert.  Psychiatric:        Mood and Affect: Mood normal.        Behavior: Behavior normal.     Studies Reviewed: None   Assessment/Plan Jenna Gordon is a 56 y.o. female with standing history of nasal congestion, right greater than left, unresponsive to treatment with intranasal  steroid sprays.  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 Risks, benefits of surgery as well as expected postoperative course and recovery were reviewed with patient, who expressed understanding and agreement.    Jenna Gordon 10/18/2021, 11:37 AM

## 2021-10-18 NOTE — Discharge Instructions (Signed)
SEPTOPLASTY Post Operative Instructions  Office: [phone]  The Surgery Itself Septoplasty and turbinate reduction involves general anesthesia, typically for one to two hours. Patients may be sedated for several hours after surgery and may remain sleepy for the better part of the day. Nausea and vomiting are occasionally seen, and usually resolve by the evening of surgery - even without additional medications. Almost all patients can go home the day of surgery.  After Surgery  Facial pressure and fullness similar to a sinus infection/headache is normal after surgery. Breathing through your nose is also difficult due to swelling. A humidifier or vaporizer can be used in the bedroom to prevent throat pain with mouth breathing.   Bloody nasal drainage is normal after this surgery for 5-7 days, usually decreasing in volume with each day that passes. Drainage will flow from the front of the nose and down the back of the throat. Make sure you spit out blood drainage that drips down the back of your throat to prevent nausea/vomiting. You will have a nasal drip pad/sling with gauze to catch drainage from the front of your nose. The dressing may need to be changed frequently during the first 24 hours following surgery. In case of profuse nasal bleeding, you may apply ice to the bridge of the nose and pinch the nose just above the tip and hold for 10 minutes; if bleeding continues, contact the doctors office.   Frequent hot showers or saline nasal rinses (NeilMed) will help break up congestion and clear any clot or mucus that builds up within the nose after surgery. This can be started the day after surgery.   It is more comfortable to sleep with extra pillows or in a recliner for the first few days after surgery until the drainage begins to resolve.    Do not blow your nose for 2 weeks after surgery.   Avoid lifting > 10 lbs. and no vigorous exercise for 2 weeks after Surgery.   Avoid airplane  travel for 2 weeks following sinus surgery; the cabin pressure changes can cause pain and swelling within the nose/sinuses.   Sense of smell and taste are often diminished for several weeks after surgery. There may be some tenderness or numbness in your upper front teeth, which is normal after surgery. You may express old clot, discolored mucus or very large nasal crusts from your nose for up to 3-4 weeks after surgery; depending on how frequently and how effectively you irrigate your nose with the saltwater spray.   You may have absorbable sutures inside of your nose after surgery that will slowly dissolve in 2-3 weeks. Be careful when clearing crusts from the nose since they may be attached to these sutures.  Medications  Pain medication can be used for pain as prescribed. Pain and pressure in the nose is expected after surgery. As the surgical site heals, pain will resolve over the course of a week. Pain medications can cause nausea, which can be prevented if you take them with food or milk.   You can use 2 nasal sprays after surgery: Afrin can be used up to 2 times a day for up to 5 days after surgery (best before bed) to reduce bloody drainage from the nose for the first few days after surgery. Saline/salt water spray can be used as often as you would like starting the day after surgery to prevent crusting inside of the nose.   Take all of your routine medications as prescribed, unless told otherwise  by your surgeon. Any medications that thin the blood should be avoided. This includes aspirin. Avoid aspirin-like products for the first 72 hours after surgery (Advil, Motrin, Excedrin, Alieve, Celebrex, Naprosyn), but you may use them as needed for pain after 72 hours.

## 2021-10-18 NOTE — Progress Notes (Signed)
Pt reports that she is not having burning on her skin anymore.

## 2021-10-18 NOTE — Transfer of Care (Signed)
Immediate Anesthesia Transfer of Care Note  Patient: Jenna Gordon  Procedure(s) Performed: NASAL SEPTOPLASTY WITH TURBINATE REDUCTION AND REPAIR OF NASAL VALVE COLLAPSE (Bilateral)  Patient Location: PACU  Anesthesia Type:General  Level of Consciousness: awake and alert   Airway & Oxygen Therapy: Patient Spontanous Breathing  Post-op Assessment: Report given to RN and Post -op Vital signs reviewed and stable  Post vital signs: Reviewed and stable  Last Vitals:  Vitals Value Taken Time  BP 148/90 10/18/21 1435  Temp    Pulse 66 10/18/21 1437  Resp 11 10/18/21 1437  SpO2 96 % 10/18/21 1437  Vitals shown include unvalidated device data.  Last Pain:  Vitals:   10/18/21 1023  TempSrc: Oral         Complications: No notable events documented.

## 2021-10-18 NOTE — Anesthesia Preprocedure Evaluation (Addendum)
Anesthesia Evaluation  Patient identified by MRN, date of birth, ID band Patient awake    Reviewed: Allergy & Precautions, NPO status , Patient's Chart, lab work & pertinent test results  History of Anesthesia Complications Negative for: history of anesthetic complications  Airway Mallampati: II  TM Distance: >3 FB Neck ROM: Full    Dental no notable dental hx.    Pulmonary neg pulmonary ROS,    Pulmonary exam normal        Cardiovascular negative cardio ROS Normal cardiovascular exam     Neuro/Psych Depression negative neurological ROS     GI/Hepatic Neg liver ROS, GERD  Controlled,  Endo/Other  negative endocrine ROS  Renal/GU negative Renal ROS  negative genitourinary   Musculoskeletal  (+) Arthritis , Rheumatoid disorders,    Abdominal   Peds  Hematology negative hematology ROS (+)   Anesthesia Other Findings Lupus, Deviated septum, Obstruction of nasal valve,Hypertrophy of inferior nasal turbinate  Reproductive/Obstetrics negative OB ROS                            Anesthesia Physical Anesthesia Plan  ASA: 2  Anesthesia Plan: General   Post-op Pain Management: Tylenol PO (pre-op)*   Induction: Intravenous  PONV Risk Score and Plan: 3 and Treatment may vary due to age or medical condition, Ondansetron, Dexamethasone, Midazolam and Scopolamine patch - Pre-op  Airway Management Planned: Oral ETT  Additional Equipment: None  Intra-op Plan:   Post-operative Plan: Extubation in OR  Informed Consent: I have reviewed the patients History and Physical, chart, labs and discussed the procedure including the risks, benefits and alternatives for the proposed anesthesia with the patient or authorized representative who has indicated his/her understanding and acceptance.     Dental advisory given  Plan Discussed with: CRNA  Anesthesia Plan Comments:        Anesthesia Quick  Evaluation

## 2021-10-18 NOTE — Anesthesia Procedure Notes (Signed)
Procedure Name: Intubation Date/Time: 10/18/2021 1:09 PM  Performed by: Lorie Phenix, CRNAPre-anesthesia Checklist: Patient identified, Emergency Drugs available, Suction available and Patient being monitored Patient Re-evaluated:Patient Re-evaluated prior to induction Oxygen Delivery Method: Circle system utilized Preoxygenation: Pre-oxygenation with 100% oxygen Induction Type: IV induction Ventilation: Mask ventilation without difficulty Laryngoscope Size: Mac and 3 Grade View: Grade I Tube type: Oral Tube size: 7.0 mm Number of attempts: 1 Airway Equipment and Method: Stylet Placement Confirmation: ETT inserted through vocal cords under direct vision, positive ETCO2 and breath sounds checked- equal and bilateral Secured at: 21 cm Tube secured with: Tape Dental Injury: Teeth and Oropharynx as per pre-operative assessment

## 2021-10-18 NOTE — Progress Notes (Signed)
Pt complaining of burning at umbilicus and upper medial thighs after CHG 6 cloth wipes pre op. No rash visualized. Pt states she had shaved her upper legs on Sunday or Monday.Pt given warm moist wash cloths to apply to burning areas and dry cloths for drying area. Pt began complaing of burning under breast area as well. Again no rash noted. Pt did point out an area where she had burned her left breast with a clothes steamer. A blister had formed and popped. No drainage or odor noted. Mepitel dressing applied to area on left breast. Dr. Daiva Huge notified of pt's complaints. Per Dr. Daiva Huge allow pt to continue to apply warm moist wash cloths as neede and dry accordingly. Dr. Daiva Huge to come see pt.

## 2021-10-18 NOTE — Op Note (Signed)
OPERATIVE NOTE  Jaci Desanto Date/Time of Admission: 10/18/2021 10:08 AM  CSN: [id number];OYD:[id number] Attending Provider: Ebbie Latus A, DO Room/Bed: MCPO/NONE DOB: 1965/05/03 Age: 56 y.o.   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  Procedure: Procedure(s): NASAL SEPTOPLASTY WITH TURBINATE REDUCTION AND REPAIR OF NASAL VALVE COLLAPSE  Anesthesia: General  Surgeon(s): [place], DO  Staff: Circulator: Sadie Haber, RN; Colon Flattery, RN Scrub Person: Jackey Loge K  Implants: * No implants in log *  Specimens: * No specimens in log *  Complications: None  EBL: 30 ML  Condition: stable  Operative Findings:  Severe left septal deviation, with bilateral inferior turbinate hypertrophy and bilateral nasal valve collapse  Description of Operation: .masopnotesepOnce operative consent was obtained and the site and surgery were confirmed with the patient and the operating room team, the patient was brought back to the operating room and general endotracheal anesthesia was obtained. The patient was turned over to the ENT service. Lidocaine 1% with 1:100,000 epinephrine was injected into the nasal septum bilaterally, inferior turbinates bilaterally, the middle turbinates bilaterally, and the axilla between the medial turbinate and the lateral nasal wall. Afrin-soaked pledgets were placed into the nasal cavity, and the patient was prepped and draped in sterile fashion. Attention was first turned to the left nasal vestibule. A left sided hemi-transfixion incision was made a submucoperichondrial flap was elevated on the left.  A submucous resection of  nasal septal cartilage was performed with care taken to leave a 1.5 cm caudal and dorsal strut. In doing this, a right-sided submucoperichondrial  flap was elevated.  The bony nasal septum was addressed by lifting up the soft tissues, separating the superior septum with a double action scissor and then removing the inferior deflected portion. The nasal spine was removed with a 3m osteotome. With this completed, the nasal septum was midline. The submucoperichondrial flaps were returned to their anatomic position and hemi-transfixion incision was closed with interrupted 4-0 chromic gut. A 4-0 plain gut suture was used to perform a mattress style stitch in a circular direction from anterior to posterior septum to re-approximate the right and left sided flaps.  Attention was then turned to the inferior turbinates.They were outfractured and then submucous resection was performed by making an incision in the leading edge with a 15 blade, separating the mucosa from bone with a Cottle elevator and then using the micro debrider with a turbinate blade to remove bone.   Next, bilateral alar baton grafts were fashioned from the harvested septal cartilage. On each side of the nasal cavity, a 5 mm incision was made over the pyriform aperture. A pocket the size of the graft was made with a converse scissor and the graft was placed in the pocket. The incisions were closed with interrupted 5-0 Chromic suture.   Doyle nasal splints were placed in the bilateral nasal cavities and sutured to the columella with a 3-0 silk suture and a nasal drip pad was applied. An orogastric tube was placed and the stomach cavity was suctioned to reduce postoperative nausea. The patient was turned over to anesthesia service and was extubated in the operating room and transferred to the PACU in stable condition. The patient will be discharged today and followed up in the ENT clinic in 1 week for  splint removal and a postoperative check.    MPolson DBen HillENT  10/18/2021    

## 2021-10-19 ENCOUNTER — Encounter (HOSPITAL_COMMUNITY): Payer: Self-pay | Admitting: Otolaryngology

## 2021-10-19 NOTE — Anesthesia Postprocedure Evaluation (Signed)
Anesthesia Post Note  Patient: Jenna Gordon  Procedure(s) Performed: NASAL SEPTOPLASTY WITH TURBINATE REDUCTION AND REPAIR OF NASAL VALVE COLLAPSE (Bilateral)     Patient location during evaluation: PACU Anesthesia Type: General Level of consciousness: awake and alert Pain management: pain level controlled Vital Signs Assessment: post-procedure vital signs reviewed and stable Respiratory status: spontaneous breathing, nonlabored ventilation and respiratory function stable Cardiovascular status: blood pressure returned to baseline Postop Assessment: no apparent nausea or vomiting Anesthetic complications: no   No notable events documented.  Last Vitals:  Vitals:   10/18/21 1520 10/18/21 1535  BP: (!) 158/90 (!) 156/91  Pulse: 61 (!) 58  Resp: 16 12  Temp:  36.7 C  SpO2: 98% 97%    Last Pain:  Vitals:   10/18/21 1535  TempSrc:   PainSc: 2    Pain Goal:                   Marthenia Rolling

## 2021-10-25 ENCOUNTER — Encounter: Payer: Self-pay | Admitting: Rheumatology

## 2021-10-25 ENCOUNTER — Ambulatory Visit: Payer: 59 | Attending: Rheumatology | Admitting: Rheumatology

## 2021-10-25 VITALS — BP 112/79 | HR 84 | Resp 14 | Ht 63.0 in | Wt 159.8 lb

## 2021-10-25 DIAGNOSIS — M3500 Sicca syndrome, unspecified: Secondary | ICD-10-CM

## 2021-10-25 DIAGNOSIS — F330 Major depressive disorder, recurrent, mild: Secondary | ICD-10-CM

## 2021-10-25 DIAGNOSIS — M0630 Rheumatoid nodule, unspecified site: Secondary | ICD-10-CM | POA: Diagnosis not present

## 2021-10-25 DIAGNOSIS — M0579 Rheumatoid arthritis with rheumatoid factor of multiple sites without organ or systems involvement: Secondary | ICD-10-CM

## 2021-10-25 DIAGNOSIS — I7300 Raynaud's syndrome without gangrene: Secondary | ICD-10-CM

## 2021-10-25 DIAGNOSIS — R6889 Other general symptoms and signs: Secondary | ICD-10-CM

## 2021-10-25 DIAGNOSIS — Z79899 Other long term (current) drug therapy: Secondary | ICD-10-CM | POA: Diagnosis not present

## 2021-10-25 DIAGNOSIS — E782 Mixed hyperlipidemia: Secondary | ICD-10-CM

## 2021-10-25 DIAGNOSIS — M8589 Other specified disorders of bone density and structure, multiple sites: Secondary | ICD-10-CM

## 2021-10-25 DIAGNOSIS — M2241 Chondromalacia patellae, right knee: Secondary | ICD-10-CM

## 2021-10-25 DIAGNOSIS — M2242 Chondromalacia patellae, left knee: Secondary | ICD-10-CM

## 2021-10-25 DIAGNOSIS — J370 Chronic laryngitis: Secondary | ICD-10-CM

## 2021-10-25 DIAGNOSIS — K219 Gastro-esophageal reflux disease without esophagitis: Secondary | ICD-10-CM

## 2021-10-25 MED ORDER — METHOTREXATE 2.5 MG PO TABS: ORAL_TABLET | ORAL | 0 refills | Status: DC

## 2021-10-25 MED ORDER — FOLIC ACID 1 MG PO TABS: 1.0000 mg | ORAL_TABLET | Freq: Every day | ORAL | 3 refills | Status: DC

## 2021-10-25 NOTE — Patient Instructions (Addendum)
Standing Labs We placed an order today for your standing lab work.   Please have your standing labs drawn in January and every 3 months  Please have your labs drawn 2 weeks prior to your appointment so that the provider can discuss your lab results at your appointment.  Please note that you may see your imaging and lab results in [place] before we have reviewed them. We will contact you once all results are reviewed. Please allow our office up to 72 hours to thoroughly review all of the results before contacting the office for clarification of your results.  Lab hours are: Monday through Thursday from 1:30 pm-4:30 pm and Friday from 1:30 pm- 4:00 pm  You may experience shorter wait times on Monday, Thursday or Friday afternoons,.   Effective November 20, 2021, new lab hours will be: Monday through Thursday from 8:00 am -12:30 pm and 1:00 pm-5:00 pm and Friday from 8:00 am-12:00 pm.  Please be advised, all patients with office appointments requiring lab work will take precedent over walk-in lab work.   Labs are drawn by [facility]. Please bring your co-pay at the time of your lab draw.  You may receive a bill from [facility] for your lab work.  Please note if you are on Hydroxychloroquine and and an order has been placed for a Hydroxychloroquine level, you will need to have it drawn 4 hours or more after your last dose.  If you wish to have your labs drawn at another location, please call the office 24 hours in advance so we can fax the orders.  The office is located at [address] [place], [place], [place] [address] No appointment is necessary.    If you have any questions regarding directions or hours of operation,  please call [phone].   As a reminder, please drink plenty of water prior to coming for your lab work. Thanks!   Vaccines You are taking a medication(s) that can suppress your immune system.  The following immunizations are recommended: Flu annually Covid-19  Td/Tdap  (tetanus, diphtheria, pertussis) every 10 years Pneumonia (Prevnar 15 then Pneumovax 23 at least 1 year apart.  Alternatively, can take Prevnar 20 without needing additional dose) Shingrix: 2 doses from 4 weeks to 6 months apart  Please check with your PCP to make sure you are up to date.   If you have signs or symptoms of an infection or start antibiotics: First, call your PCP for workup of your infection. Hold your medication through the infection, until you complete your antibiotics, and until symptoms resolve if you take the following: Injectable medication (Actemra, Benlysta, Cimzia, Cosentyx, Enbrel, Humira, Kevzara, Orencia, Remicade, Simponi, Stelara, Taltz, Tremfya) Methotrexate Leflunomide (Arava) Mycophenolate (Cellcept) Roma Kayser, or Rinvoq   Because you are taking Morrie Sheldon, Rinvoq, or Olumiant, it is very important to know that this class of medications has a FDA BLACK BOX WARNING for major adverse cardiovascular events (MACE), thrombosis, mortality (including sudden cardiovascular death), serious infections, and lymphomas. MACE is defined as cardiovascular death, myocardial infarction, and stroke. Thrombosis includes deep venous thrombosis (DVT), pulmonary embolism (PE), and arterial thrombosis. If you are a current or former smoker, you are at higher risk for MACE.

## 2021-10-26 LAB — COMPLETE METABOLIC PANEL WITH GFR
AG Ratio: 1.5 (calc) (ref 1.0–2.5)
ALT: 19 U/L (ref 6–29)
AST: 12 U/L (ref 10–35)
Albumin: 4.2 g/dL (ref 3.6–5.1)
Alkaline phosphatase (APISO): 93 U/L (ref 37–153)
BUN: 15 mg/dL (ref 7–25)
CO2: 26 mmol/L (ref 20–32)
Calcium: 9.1 mg/dL (ref 8.6–10.4)
Chloride: 105 mmol/L (ref 98–110)
Creat: 0.98 mg/dL (ref 0.50–1.03)
Globulin: 2.8 g/dL (calc) (ref 1.9–3.7)
Glucose, Bld: 108 mg/dL — ABNORMAL HIGH (ref 65–99)
Potassium: 4.5 mmol/L (ref 3.5–5.3)
Sodium: 139 mmol/L (ref 135–146)
Total Bilirubin: 0.4 mg/dL (ref 0.2–1.2)
Total Protein: 7 g/dL (ref 6.1–8.1)
eGFR: 67 mL/min/{1.73_m2} (ref >=60)

## 2021-10-26 NOTE — Progress Notes (Signed)
CMP is normal.

## 2021-11-11 ENCOUNTER — Other Ambulatory Visit: Payer: Self-pay | Admitting: Physician Assistant

## 2021-11-13 ENCOUNTER — Encounter: Payer: Self-pay | Admitting: *Deleted

## 2021-12-18 ENCOUNTER — Encounter: Payer: Self-pay | Admitting: Family Medicine

## 2021-12-18 ENCOUNTER — Other Ambulatory Visit (HOSPITAL_COMMUNITY)
Admission: RE | Admit: 2021-12-18 | Discharge: 2021-12-18 | Disposition: A | Discharge status: Home / Self Care | Payer: 59 | Source: Ambulatory Visit | Attending: Family Medicine | Admitting: Family Medicine

## 2021-12-18 ENCOUNTER — Ambulatory Visit (INDEPENDENT_AMBULATORY_CARE_PROVIDER_SITE_OTHER): Payer: 59 | Admitting: Family Medicine

## 2021-12-18 VITALS — BP 102/64 | HR 40 | Ht 63.0 in | Wt 160.0 lb

## 2021-12-18 DIAGNOSIS — A630 Anogenital (venereal) warts: Secondary | ICD-10-CM | POA: Insufficient documentation

## 2021-12-18 DIAGNOSIS — F339 Major depressive disorder, recurrent, unspecified: Secondary | ICD-10-CM | POA: Diagnosis not present

## 2021-12-18 DIAGNOSIS — H9190 Unspecified hearing loss, unspecified ear: Secondary | ICD-10-CM

## 2021-12-18 DIAGNOSIS — Z0000 Encounter for general adult medical examination without abnormal findings: Secondary | ICD-10-CM

## 2021-12-18 DIAGNOSIS — B977 Papillomavirus as the cause of diseases classified elsewhere: Secondary | ICD-10-CM | POA: Insufficient documentation

## 2021-12-18 DIAGNOSIS — Z113 Encounter for screening for infections with a predominantly sexual mode of transmission: Secondary | ICD-10-CM

## 2021-12-18 NOTE — Patient Instructions (Addendum)
It was great to see you again today!  Pap today Let's see what the result is, but plan on you seeing Dr. Micah Noel for a colposcopy either way STI screening today  Be well, Dr. Ardelia Mems  ----------------------------------  Health Maintenance, Female Adopting a healthy lifestyle and getting preventive care are important in promoting health and wellness. Ask your health care provider about: The right schedule for you to have regular tests and exams. Things you can do on your own to prevent diseases and keep yourself healthy. What should I know about diet, weight, and exercise? Eat a healthy diet  Eat a diet that includes plenty of vegetables, fruits, low-fat dairy products, and lean protein. Do not eat a lot of foods that are high in solid fats, added sugars, or sodium. Maintain a healthy weight Body mass index (BMI) is used to identify weight problems. It estimates body fat based on height and weight. Your health care provider can help determine your BMI and help you achieve or maintain a healthy weight. Get regular exercise Get regular exercise. This is one of the most important things you can do for your health. Most adults should: Exercise for at least 150 minutes each week. The exercise should increase your heart rate and make you sweat (moderate-intensity exercise). Do strengthening exercises at least twice a week. This is in addition to the moderate-intensity exercise. Spend less time sitting. Even light physical activity can be beneficial. Watch cholesterol and blood lipids Have your blood tested for lipids and cholesterol at 57 years of age, then have this test every 5 years. Have your cholesterol levels checked more often if: Your lipid or cholesterol levels are high. You are older than 57 years of age. You are at high risk for heart disease. What should I know about cancer screening? Depending on your health history and family history, you may need to have cancer screening at  various ages. This may include screening for: Breast cancer. Cervical cancer. Colorectal cancer. Skin cancer. Lung cancer. What should I know about heart disease, diabetes, and high blood pressure? Blood pressure and heart disease High blood pressure causes heart disease and increases the risk of stroke. This is more likely to develop in people who have high blood pressure readings or are overweight. Have your blood pressure checked: Every 3-5 years if you are 35-16 years of age. Every year if you are 42 years old or older. Diabetes Have regular diabetes screenings. This checks your fasting blood sugar level. Have the screening done: Once every three years after age 55 if you are at a normal weight and have a low risk for diabetes. More often and at a younger age if you are overweight or have a high risk for diabetes. What should I know about preventing infection? Hepatitis B If you have a higher risk for hepatitis B, you should be screened for this virus. Talk with your health care provider to find out if you are at risk for hepatitis B infection. Hepatitis C Testing is recommended for: Everyone born from [birth year] through 1966. Anyone with known risk factors for hepatitis C. Sexually transmitted infections (STIs) Get screened for STIs, including gonorrhea and chlamydia, if: You are sexually active and are younger than 57 years of age. You are older than 57 years of age and your health care provider tells you that you are at risk for this type of infection. Your sexual activity has changed since you were last screened, and you are at increased risk for  chlamydia or gonorrhea. Ask your health care provider if you are at risk. Ask your health care provider about whether you are at high risk for HIV. Your health care provider may recommend a prescription medicine to help prevent HIV infection. If you choose to take medicine to prevent HIV, you should first get tested for HIV. You should then be  tested every 3 months for as long as you are taking the medicine. Pregnancy If you are about to stop having your period (premenopausal) and you may become pregnant, seek counseling before you get pregnant. Take 400 to 800 micrograms (mcg) of folic acid every day if you become pregnant. Ask for birth control (contraception) if you want to prevent pregnancy. Osteoporosis and menopause Osteoporosis is a disease in which the bones lose minerals and strength with aging. This can result in bone fractures. If you are 27 years old or older, or if you are at risk for osteoporosis and fractures, ask your health care provider if you should: Be screened for bone loss. Take a calcium or vitamin D supplement to lower your risk of fractures. Be given hormone replacement therapy (HRT) to treat symptoms of menopause. Follow these instructions at home: Alcohol use Do not drink alcohol if: Your health care provider tells you not to drink. You are pregnant, may be pregnant, or are planning to become pregnant. If you drink alcohol: Limit how much you have to: 0-1 drink a day. Know how much alcohol is in your drink. In the [place] one drink equals one 12 oz bottle of beer (355 mL), one 5 oz glass of wine (148 mL), or one 1 oz glass of hard liquor (44 mL). Lifestyle Do not use any products that contain nicotine or tobacco. These products include cigarettes, chewing tobacco, and vaping devices, such as e-cigarettes. If you need help quitting, ask your health care provider. Do not use street drugs. Do not share needles. Ask your health care provider for help if you need support or information about quitting drugs. General instructions Schedule regular health, dental, and eye exams. Stay current with your vaccines. Tell your health care provider if: You often feel depressed. You have ever been abused or do not feel safe at home. Summary Adopting a healthy lifestyle and getting preventive care are important in  promoting health and wellness. Follow your health care provider's instructions about healthy diet, exercising, and getting tested or screened for diseases. Follow your health care provider's instructions on monitoring your cholesterol and blood pressure. This information is not intended to replace advice given to you by your health care provider. Make sure you discuss any questions you have with your health care provider. Document Revised: 05/30/2020 Document Reviewed: 05/30/2020 Elsevier Patient Education  [facility] [facility].

## 2021-12-18 NOTE — Progress Notes (Unsigned)
  Date of Visit: 12/18/2021   SUBJECTIVE:   HPI:  Jenna Gordon presents today for a well woman exam.   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 Cyndie Chime office, recommendation was for 1 year follow up  Exercise: yes, regularly Smoking: no Alcohol: occasional Drugs: no Mood: doing well on zoloft Dentist: goes twice a year Cancers in family: stomach cancer in dad (diagnosed at age 73, deceased)  Bad breath - has had this for a while, family member told her it could be something related to her stomach.   Has hair bumps from shaving pubic hair  OBJECTIVE:   BP 102/64   Pulse (!) 40   Ht '5\' 3"'$  (1.6 m)   Wt 160 lb (72.6 kg)   LMP 05/05/2014 (Approximate)   SpO2 98%   BMI 28.34 kg/m  Gen: NAD, pleasant, cooperative HEENT: NCAT, PERRL, no palpable thyromegaly or anterior cervical lymphadenopathy Heart: RRR, no murmurs Lungs: CTAB, NWOB Abdomen: soft, nontender to palpation Neuro: grossly nonfocal, speech normal GU: normal appearing external genitalia, mild folliculitis of mons pubis. Vagina is moist with white discharge. Cervix has small lesion vs scar tissue at 11oclock position. Pinpoint cervical os. No cervical motion tenderness or tenderness on bimanual exam. No adnexal masses.   ASSESSMENT/PLAN:   Health maintenance:  -STD screening: gc/chl/trich w/ pap today, along with HIV & RPR -pap smear: cytology & HPV cotest ordered today with reflexive genotyping if HPV pos -mammogram: UTD -lipid screening: UTD -colon cancer screening: has EGD & colonoscopy scheduled for 12/8 -immunizations:  Flu: UTD Tdap: UTD Shingrix: UTD COVID: UTD Pneumovax: UTD -handout given on health maintenance topics  Bad breath - no abnormalities of mouth or oropharynx on exam, offered reassurance, continue  follow up with dentist  Counseled on trimming pubic hair rather than shaving to avoid folliculitis  Requests referral to audiologist due to decreased hearing, will refer  Depression, recurrent ([place]) Doing very well on sertraline, continue current medication regimen.  High risk HPV infection Pap cotest w/ reflex to HPV genotyping ordered today Lesion present on cervix which I suspect may just be scar tissue from prior cervical biopsy, but warrants further evaluation with colpo. Await pap result, but advised seeing Dr. Micah Noel for follow up colpo either way to assess cervix.  FOLLOW UP: Follow up in 1 year for next CPE  Jenna Gordon, [place]

## 2021-12-19 LAB — RPR: RPR Ser Ql: NONREACTIVE

## 2021-12-19 LAB — HIV ANTIBODY (ROUTINE TESTING W REFLEX): HIV Screen 4th Generation wRfx: NONREACTIVE

## 2021-12-19 NOTE — Assessment & Plan Note (Signed)
Pap cotest w/ reflex to HPV genotyping ordered today Lesion present on cervix which I suspect may just be scar tissue from prior cervical biopsy, but warrants further evaluation with colpo. Await pap result, but advised seeing Dr. Micah Noel for follow up colpo either way to assess cervix.

## 2021-12-19 NOTE — Assessment & Plan Note (Signed)
Doing very well on sertraline, continue current medication regimen.

## 2021-12-20 LAB — CYTOLOGY - PAP
Adequacy: ABSENT
Chlamydia: NEGATIVE
Comment: NEGATIVE
Comment: NEGATIVE
Comment: NEGATIVE
Comment: NEGATIVE
Comment: NEGATIVE
Comment: NORMAL
Diagnosis: NEGATIVE
Diagnosis: REACTIVE
HPV 16: NEGATIVE
HPV 18 / 45: NEGATIVE
High risk HPV: POSITIVE — AB
Neisseria Gonorrhea: NEGATIVE
Trichomonas: NEGATIVE

## 2021-12-21 ENCOUNTER — Encounter: Payer: Self-pay | Admitting: Family Medicine

## 2021-12-25 LAB — HM COLONOSCOPY

## 2021-12-26 ENCOUNTER — Ambulatory Visit: Payer: 59 | Attending: Audiologist | Admitting: Audiologist

## 2021-12-26 DIAGNOSIS — H900 Conductive hearing loss, bilateral: Secondary | ICD-10-CM | POA: Diagnosis present

## 2021-12-26 NOTE — Procedures (Unsigned)
  Outpatient Audiology and [facility] [address] [place], [place]  [address] [phone]  AUDIOLOGICAL  EVALUATION  NAME: Jenna Gordon     DOB:   Oct 10, 1965      MRN: [id number]                                                                                     DATE: 12/26/2021     REFERENT: Leeanne Rio, MD STATUS: Outpatient DIAGNOSIS: Conductive Hearing Loss Bilaterally     History: Jenna Gordon was seen for an audiological evaluation. Jenna Gordon is receiving a hearing evaluation due to concerns for difficulty hearing while working, and her daughter feels Jenna Gordon cannot hear. Jenna Gordon has difficulty hearing in noisy places or when people talk very fast. 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 This difficulty began gradually getting worse. Jenna Gordon had her left eardrum reconstructed in 1993 after a childhood of ear infections. 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 Tinnitus intermittent in both ears. Jenna Gordon has a history of Otolaryngology follow up with Dr. Fredric Dine at [facility].  No other relevant case history reported.   Evaluation:  Otoscopy showed a clear view of the tympanic membranes, bilaterally Left ear shows mesh patch Tympanometry results were consistent with type A responses, hypercompliance in the right ear and shallow in the left ear Audiometric testing was completed using conventional audiometry with insert transducer. Speech Recognition Thresholds were 30dB in the right ear and 35dB in the left ear. Word Recognition was performed  75dB, scored 100% in the right ear and 100% in the left ear. Pure tone thresholds show slight conductive hearing loss in the right ear and mild conductive hearing loss in the left ear with one threshold in moderate range at [facility].   Results:  The test results were reviewed with [name]. She has a mild hearing loss in each ear due to the dysfunction of her eardrums. [name] has a slight to mild  conductive hearing loss bilaterally, worse in the left ear. The mild degree of loss can impact speech understanding. 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 [name] is motivated to try hearing aids and is tired of not understanding people.  She was given a copy of her hearing test, a list of hearing aid providers, and a handout on how to access hearing aids through [employer].   Recommendations: Amplification is necessary for both ears. Hearing aids can be purchased from a variety of locations. See provided list for locations in the [place] area.  Continue follow up as recommended by Otolaryngology provider.    43 minutes spent testing and counseling on results.   Alfonse Alpers  Audiologist, Au.D., CCC-A 12/26/2021  5:04 PM  Cc: Leeanne Rio, MD

## 2021-12-31 ENCOUNTER — Other Ambulatory Visit: Payer: Self-pay | Admitting: Physician Assistant

## 2022-01-01 NOTE — Telephone Encounter (Signed)
Next Visit: 03/27/2022  Last Visit: 10/25/2021  Last Fill: 09/14/2021  LV:DIXVEZBMZT arthritis involving multiple sites with positive rheumatoid factor   Current Dose per office note on 10/25/2021: Morrie Sheldon 11 mg XR daily   Labs: 10/25/2021 CMP: glucose 108 10/18/2021 CBC  TB Gold: 04/13/2021 negative    Okay to refill xeljanz?

## 2022-01-09 ENCOUNTER — Telehealth: Payer: Self-pay | Admitting: Pharmacist

## 2022-01-09 NOTE — Telephone Encounter (Signed)
Received notification from [facility] regarding a prior authorization for [facility]. Authorization has been APPROVED from 01/09/22 to 01/10/23. Approval letter sent to scan center.  Patient must continue to fill through [place]: [phone]   Authorization #  [id number] Phone # [phone]  Knox Saliva, PharmD, MPH, BCPS, CPP Clinical Pharmacist (Rheumatology and Pulmonology)

## 2022-01-09 NOTE — Telephone Encounter (Signed)
Submitted a Prior Authorization RENEWAL request to [facility] for [facility] via CoverMyMeds. Will update once we receive a response.  Key: [id number]  Knox Saliva, PharmD, MPH, BCPS, CPP Clinical Pharmacist (Rheumatology and Pulmonology)

## 2022-01-23 ENCOUNTER — Other Ambulatory Visit: Payer: Self-pay | Admitting: *Deleted

## 2022-01-23 DIAGNOSIS — Z111 Encounter for screening for respiratory tuberculosis: Secondary | ICD-10-CM

## 2022-01-23 DIAGNOSIS — Z9225 Personal history of immunosupression therapy: Secondary | ICD-10-CM

## 2022-01-23 DIAGNOSIS — Z79899 Other long term (current) drug therapy: Secondary | ICD-10-CM

## 2022-01-23 DIAGNOSIS — E782 Mixed hyperlipidemia: Secondary | ICD-10-CM

## 2022-01-24 LAB — LIPID PANEL
Cholesterol: 192 mg/dL (ref <200)
HDL: 66 mg/dL (ref >=50)
LDL Cholesterol (Calc): 92 mg/dL (calc)
Non-HDL Cholesterol (Calc): 126 mg/dL (calc) (ref <130)
Total CHOL/HDL Ratio: 2.9 (calc) (ref <5.0)
Triglycerides: 256 mg/dL — ABNORMAL HIGH (ref <150)

## 2022-01-24 LAB — COMPLETE METABOLIC PANEL WITH GFR
AG Ratio: 1.4 (calc) (ref 1.0–2.5)
ALT: 28 U/L (ref 6–29)
AST: 27 U/L (ref 10–35)
Albumin: 4.1 g/dL (ref 3.6–5.1)
Alkaline phosphatase (APISO): 86 U/L (ref 37–153)
BUN: 19 mg/dL (ref 7–25)
CO2: 27 mmol/L (ref 20–32)
Calcium: 9.1 mg/dL (ref 8.6–10.4)
Chloride: 105 mmol/L (ref 98–110)
Creat: 0.77 mg/dL (ref 0.50–1.03)
Globulin: 3 g/dL (calc) (ref 1.9–3.7)
Glucose, Bld: 91 mg/dL (ref 65–99)
Potassium: 4.2 mmol/L (ref 3.5–5.3)
Sodium: 141 mmol/L (ref 135–146)
Total Bilirubin: 0.3 mg/dL (ref 0.2–1.2)
Total Protein: 7.1 g/dL (ref 6.1–8.1)
eGFR: 90 mL/min/{1.73_m2} (ref >=60)

## 2022-01-24 LAB — CBC WITH DIFFERENTIAL/PLATELET
Absolute Monocytes: 364 cells/uL (ref 200–950)
Basophils Absolute: 11 cells/uL (ref 0–200)
Basophils Relative: 0.2 %
Eosinophils Absolute: 403 cells/uL (ref 15–500)
Eosinophils Relative: 7.2 %
HCT: 42.4 % (ref 35.0–45.0)
Hemoglobin: 14.3 g/dL (ref 11.7–15.5)
Lymphs Abs: 1064 cells/uL (ref 850–3900)
MCH: 31.7 pg (ref 27.0–33.0)
MCHC: 33.7 g/dL (ref 32.0–36.0)
MCV: 94 fL (ref 80.0–100.0)
MPV: 10.7 fL (ref 7.5–12.5)
Monocytes Relative: 6.5 %
Neutro Abs: 3758 cells/uL (ref 1500–7800)
Neutrophils Relative %: 67.1 %
Platelets: 194 10*3/uL (ref 140–400)
RBC: 4.51 10*6/uL (ref 3.80–5.10)
RDW: 13.3 % (ref 11.0–15.0)
Total Lymphocyte: 19 %
WBC: 5.6 10*3/uL (ref 3.8–10.8)

## 2022-01-24 NOTE — Progress Notes (Signed)
Triglycerides are elevated.  CBC and CMP are normal.  Please forward results to her PCP.

## 2022-01-31 ENCOUNTER — Encounter: Payer: Self-pay | Admitting: Family Medicine

## 2022-02-08 ENCOUNTER — Other Ambulatory Visit: Payer: Self-pay | Admitting: Physician Assistant

## 2022-02-08 NOTE — Telephone Encounter (Signed)
Next Visit: 03/27/2022  Last Visit: 10/25/2021  Last Fill: 10/25/2021  DX: Rheumatoid arthritis involving multiple sites with positive rheumatoid factor   Current Dose per office note 10/25/2021: methotrexate 6 tablets by mouth once weekly and folic acid 2 mg daily.   Labs: 01/23/2022 CBC and CMP are normal.   Okay to refill MTX and Folic Acid?

## 2022-03-13 NOTE — Progress Notes (Unsigned)
Office Visit Note  Patient: Jenna Gordon             Date of Birth: 1966-01-05           MRN: [id number]             PCP: Leeanne Rio, MD Referring: Leeanne Rio, MD Visit Date: 03/27/2022 Occupation: '@GUAROCC'$ @  Subjective:  Flare in the right hand  History of Present Illness: Jenna Gordon is a 57 y.o. female with history of seropositive rheumatoid arthritis.  Patient is currently taking Xeljanz 11 mg XR daily, methotrexate 6 tablets by mouth once weekly and folic acid 1 mg daily.  She is tolerating combination therapy without any side effects.  Patient reports that she has been having a flare in the right hand for the past 2 weeks.  She attributes her recent flare to overuse activities performing household work.  She does not want to make any medication changes at this time.  She continues to find the current treatment regimen to be most effective at managing her symptoms.  She states that she has noticed increased hair loss on her scalp as well as in her eyelashes. She denies any recent or recurrent infections.  Activities of Daily Living:  Patient reports morning stiffness for 2 hours.   Patient Reports nocturnal pain.  Difficulty dressing/grooming: Reports Difficulty climbing stairs: Reports Difficulty getting out of chair: Reports Difficulty using hands for taps, buttons, cutlery, and/or writing: Reports  Review of Systems  Constitutional:  Negative for fatigue.  HENT:  Positive for mouth sores and mouth dryness.   Eyes:  Positive for dryness.  Respiratory:  Negative for shortness of breath.   Cardiovascular:  Negative for palpitations.  Gastrointestinal:  Negative for blood in stool, constipation and diarrhea.  Endocrine: Negative for increased urination.  Genitourinary:  Negative for involuntary urination.  Musculoskeletal:  Positive for joint pain, joint pain, joint swelling and morning stiffness. Negative for gait problem, myalgias, muscle weakness,  muscle tenderness and myalgias.  Skin:  Positive for color change, hair loss and sensitivity to sunlight. Negative for rash.  Allergic/Immunologic: Positive for susceptible to infections.  Neurological:  Negative for dizziness and headaches.  Hematological:  Negative for swollen glands.  Psychiatric/Behavioral:  Negative for depressed mood and sleep disturbance. The patient is not nervous/anxious.     PMFS History:  Patient Active Problem List   Diagnosis Date Noted   Nasal congestion 10/18/2021   Nasal septal deviation 10/18/2021   Chronic bronchitis ([place]) 03/06/2021   Hoarseness 03/06/2021   Stress incontinence 12/06/2020   Rib pain on right side 04/13/2020   High risk HPV infection 12/10/2019   Mouth lesion 06/01/2019   Depression, recurrent ([place]) 06/01/2019   Corneal irritation of both eyes 03/04/2019   Fibroid uterus 06/20/2018   Grade II internal hemorrhoids 06/20/2018   Mixed hyperlipidemia 03/14/2016   Environmental allergies 03/14/2016   Gastroesophageal reflux disease without esophagitis 02/24/2015   Osteopenia 02/24/2015   Rash 07/14/2014   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   Raynaud phenomenon 04/11/2012   ANEMIA, IRON DEFICIENCY 09/08/2009   SJOGREN'S SYNDROME 01/06/2008   Rheumatoid arthritis of multiple sites with negative rheumatoid factor ([place]) 04/09/2007    Past Medical History:  Diagnosis Date   Amnesia memory loss 10/14/2012   Anemia 2009   Arthritis    Dry eyes    GERD (gastroesophageal reflux disease)    History of glaucoma    s/p   bilateral eye laser treatment  2007   History  of traumatic head injury 10/14/2012   Lupus (systemic lupus erythematosus) ([place])    Positive TB test    pt states cxr negative was tested due to treatment for rheumatoid arthritis since test +  , was told to take medication for tb for 9 months before going back on humira injection   Rheumatoid arthritis(714.0)    Dr. Dondra Spry, [facility]   Sjogren's disease [place])     SUI (stress urinary incontinence, female)    Wears glasses     Family History  Problem Relation Age of Onset   CVA Mother        TIA   Hypertension Mother    Cystic fibrosis Mother    Stomach cancer Father    Diabetes Father    Hypertension Father    Hypertension Brother    Hypertension Brother    Healthy Daughter    Healthy Daughter    Past Surgical History:  Procedure Laterality Date   CARPAL TUNNEL RELEASE Bilateral 2007   CERVICAL CONIZATION W/BX  [place]  x2  last one 1999   GLAUCOMA SURGERY Bilateral 2007   laser   HYSTEROSCOPY WITH D & C  03-02-2010   MASS EXCISION Left 05/21/2013   Procedure: EXCISION MASS LEFT LONG FINGER;  Surgeon: Linna Hoff, MD;  Location: [place];  Service: Orthopedics;  Laterality: Left;   NASAL SEPTOPLASTY W/ TURBINOPLASTY Bilateral 10/18/2021   Procedure: NASAL SEPTOPLASTY WITH TURBINATE REDUCTION AND REPAIR OF NASAL VALVE COLLAPSE;  Surgeon: Jason Coop, DO;  Location: [place];  Service: ENT;  Laterality: Bilateral;   SHOULDER ARTHROSCOPY WITH OPEN ROTATOR CUFF REPAIR Right 2012   TENOSYNOVECTOMY Left 05/21/2013   Procedure: LEFT INDEX FINGER TENOSYNOVECTOMY;  Surgeon: Linna Hoff, MD;  Location: [place];  Service: Orthopedics;  Laterality: Left;   TRIGGER FINGER RELEASE  2009   left middle finger   TYMPANOPLASTY Left 1993   Social History   Social History Narrative   Lives with Rise Mu 87yo, Daughter, April 21yo, Pets [place] and [place].    Immunization History  Administered Date(s) Administered   Influenza Split 10/02/2011   Influenza Whole 11/05/2008, 10/24/2009   Influenza, High Dose Seasonal PF 09/23/2014   Influenza,inj,Quad PF,6+ Mos 10/14/2012, 10/15/2016, 09/24/2017, 10/23/2018, 10/06/2020, 09/28/2021   Influenza-Unspecified 10/02/2011, 10/14/2012, 11/23/2015, 10/15/2016, 09/24/2017, 10/07/2019   PFIZER(Purple Top)SARS-COV-2 Vaccination 03/16/2019, 04/06/2019,  09/08/2019, 03/10/2020   PPD Test 02/11/2013   Pfizer Covid-19 Vaccine Bivalent Booster 19yr & up 12/06/2020, 10/22/2021   Pneumococcal Conjugate-13 07/27/2016   Pneumococcal Polysaccharide-23 12/06/2020   Td 08/06/2002   Tdap 02/06/2019   Zoster Recombinat (Shingrix) 04/13/2020, 06/13/2020     Objective: Vital Signs: Resp 15   Ht '5\' 3"'$  (1.6 m)   Wt 165 lb (74.8 kg)   LMP 05/05/2014 (Approximate)   BMI 29.23 kg/m    Physical Exam Vitals and nursing note reviewed.  Constitutional:      Appearance: She is well-developed.  HENT:     Head: Normocephalic and atraumatic.  Eyes:     Conjunctiva/sclera: Conjunctivae normal.  Cardiovascular:     Rate and Rhythm: Normal rate and regular rhythm.     Heart sounds: Normal heart sounds.  Pulmonary:     Effort: Pulmonary effort is normal.     Breath sounds: Normal breath sounds.  Abdominal:     General: Bowel sounds are normal.     Palpations: Abdomen is soft.  Musculoskeletal:     Cervical  back: Normal range of motion.  Lymphadenopathy:     Cervical: No cervical adenopathy.  Skin:    General: Skin is warm and dry.     Capillary Refill: Capillary refill takes less than 2 seconds.  Neurological:     Mental Status: She is alert and oriented to person, place, and time.  Psychiatric:        Behavior: Behavior normal.      Musculoskeletal Exam: C-spine, thoracic spine, and lumbar spine good ROM.  Shoulder joints, elbow joints, wrist joints, MCPs, PIPs, and DIPs good ROM with no synovitis.  Thickening of MCP joints.  Ulnar deviation noted bilaterally, right hand greater than left.  Thickening of PIP and DIP joints.  Tenderness and inflammation in the right second and third MCP and PIP joints.  Complete fist formation bilaterally.  Hip joints, knee joints, and ankle joints have good ROM with no discomfort.  No warmth or effusion of knee jointss.  No tenderness or swelling of ankle joints.   CDAI Exam: CDAI Score: 9.2  Patient Global:  6 mm; Provider Global: 6 mm Swollen: 4 ; Tender: 4  Joint Exam 03/27/2022      Right  Left  MCP 2  Swollen Tender     MCP 3  Swollen Tender     PIP 2  Swollen Tender     PIP 3  Swollen Tender        Investigation: No additional findings.  Imaging: No results found.  Recent Labs: Lab Results  Component Value Date   WBC 5.6 01/23/2022   HGB 14.3 01/23/2022   PLT 194 01/23/2022   NA 141 01/23/2022   K 4.2 01/23/2022   CL 105 01/23/2022   CO2 27 01/23/2022   GLUCOSE 91 01/23/2022   BUN 19 01/23/2022   CREATININE 0.77 01/23/2022   BILITOT 0.3 01/23/2022   ALKPHOS 73 04/11/2012   AST 27 01/23/2022   ALT 28 01/23/2022   PROT 7.1 01/23/2022   ALBUMIN 4.1 04/11/2012   CALCIUM 9.1 01/23/2022   GFRAA 115 03/22/2020   QFTBGOLDPLUS NEGATIVE 04/13/2021    Speciality Comments: No specialty comments available.  Procedures:  No procedures performed Allergies: Cefzil [cefprozil], Macrobid [nitrofurantoin], and Sulfa antibiotics   Assessment / Plan:     Visit Diagnoses: Rheumatoid arthritis involving multiple sites with positive rheumatoid factor ([place]) - +RF,+anti-CCP, +14-3-3n, Diagnosed in 2006: Patient presents today experiencing a flare in her right hand.  According to the patient she has been experiencing pain, stiffness, and swelling in her right hand for the past 2 weeks.  She attributes the recent flare to performing household activities.  According to the patient if she performs any overuse or repetitive activity she continues to have flares.  She is currently taking methotrexate 6 tablets by mouth once weekly and Xeljanz 11 mg XR daily.  She has been tolerating combination therapy and has not missed any doses recently.  She does not want to make any medication changes at this time despite her disease remaining active/frequent flares.  She continues to find this combination of medications to be most effective at managing her rheumatoid arthritis.  She was advised to notify us  if she starts to have more frequent or severe rheumatoid arthritis flares.  She will follow-up in the office in 5 months or sooner if needed.  High risk medication use - Xeljanz 11 mg XR by mouth daily, methotrexate 6 tablets by mouth once weekly and folic acid 2 mg daily.  CBC and  CMP WNL on 01/23/22. Her next lab work will be due in April and every 3 months. Standing orders for CBC and CMP remain in place.  Lipid panel updated on 01/23/22.  TB gold negative on 04/13/21. Order for TB gold released today.  No recent or recurrent infections.  Discussed the importance of holding xeljanz and MTX if she develops signs or symptoms of an infection and to resume once the infection has completely cleared.  Counseled on the increase risk of venous thrombosis. Counseled about FDA black box warning of MACE (major adverse CV events including cardiovascular death, myocardial infarction, and stroke).    Plan: QuantiFERON-TB Gold Plus  Screening for tuberculosis - Future order for TB gold placed today to be drawn with her upcoming lab work in April 2024. 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    Sicca syndrome ([place]): Stable.   Raynaud's phenomenon without gangrene: Not currently active.   Chondromalacia of both patellae: She has good ROM of both knee joints with no warmth or effusion.    Other medical conditions are listed as follows:   Osteopenia of multiple sites: DEXA followed by PCP.   Gastroesophageal reflux disease without esophagitis  Mild episode of recurrent major depressive disorder ([place])  Mixed hyperlipidemia: Triglycerides 256, total cholesterol 192, HDL 66, and LDL 92 on 01/23/2022.   Orders: Orders Placed This Encounter  Procedures   QuantiFERON-TB Gold Plus   Meds ordered this encounter  Medications   folic acid (FOLVITE) 1 MG tablet    Sig: Take 2 tablets (2 mg total) by mouth daily.    Dispense:  180 tablet    Refill:  3   methotrexate (RHEUMATREX)  2.5 MG tablet    Sig: TAKE 6 TABLETS BY MOUTH ONCE A  WEEK    Dispense:  72 tablet    Refill:  0   Tofacitinib Citrate ER (XELJANZ XR) 11 MG TB24    Sig: Take 1 tablet (11 mg total) by mouth daily.    Dispense:  30 tablet    Refill:  2     Follow-Up Instructions: Return in about 5 months (around 08/27/2022) for Rheumatoid arthritis.   Ofilia Neas, PA-C  Note - This record has been created using Dragon software.  Chart creation errors have been sought, but may not always  have been located. Such creation errors do not reflect on  the standard of medical care.

## 2022-03-27 ENCOUNTER — Ambulatory Visit: Payer: 59 | Attending: Physician Assistant | Admitting: Physician Assistant

## 2022-03-27 ENCOUNTER — Other Ambulatory Visit: Payer: Self-pay | Admitting: Rheumatology

## 2022-03-27 ENCOUNTER — Encounter: Payer: Self-pay | Admitting: Physician Assistant

## 2022-03-27 VITALS — Resp 15 | Ht 63.0 in | Wt 165.0 lb

## 2022-03-27 DIAGNOSIS — M0579 Rheumatoid arthritis with rheumatoid factor of multiple sites without organ or systems involvement: Secondary | ICD-10-CM

## 2022-03-27 DIAGNOSIS — M0630 Rheumatoid nodule, unspecified site: Secondary | ICD-10-CM | POA: Diagnosis not present

## 2022-03-27 DIAGNOSIS — F330 Major depressive disorder, recurrent, mild: Secondary | ICD-10-CM

## 2022-03-27 DIAGNOSIS — M3500 Sicca syndrome, unspecified: Secondary | ICD-10-CM

## 2022-03-27 DIAGNOSIS — Z111 Encounter for screening for respiratory tuberculosis: Secondary | ICD-10-CM

## 2022-03-27 DIAGNOSIS — M2242 Chondromalacia patellae, left knee: Secondary | ICD-10-CM

## 2022-03-27 DIAGNOSIS — Z79899 Other long term (current) drug therapy: Secondary | ICD-10-CM

## 2022-03-27 DIAGNOSIS — E782 Mixed hyperlipidemia: Secondary | ICD-10-CM

## 2022-03-27 DIAGNOSIS — M2241 Chondromalacia patellae, right knee: Secondary | ICD-10-CM

## 2022-03-27 DIAGNOSIS — K219 Gastro-esophageal reflux disease without esophagitis: Secondary | ICD-10-CM

## 2022-03-27 DIAGNOSIS — M8589 Other specified disorders of bone density and structure, multiple sites: Secondary | ICD-10-CM

## 2022-03-27 DIAGNOSIS — I7300 Raynaud's syndrome without gangrene: Secondary | ICD-10-CM

## 2022-03-27 MED ORDER — FOLIC ACID 1 MG PO TABS: 2.0000 mg | ORAL_TABLET | Freq: Every day | ORAL | 3 refills | Status: DC

## 2022-03-27 MED ORDER — METHOTREXATE SODIUM 2.5 MG PO TABS: ORAL_TABLET | ORAL | 0 refills | Status: DC

## 2022-03-27 MED ORDER — XELJANZ XR 11 MG PO TB24: 1.0000 | ORAL_TABLET | Freq: Every day | ORAL | 2 refills | Status: DC

## 2022-03-27 NOTE — Telephone Encounter (Signed)
Next Visit: 03/27/2022   Last Visit: 10/25/2021   Last Fill: 01/01/2022  DX: Rheumatoid arthritis involving multiple sites with positive rheumatoid factor    Current Dose per office note 10/25/2021: Jenna Gordon 11 mg XR daily   Labs: 01/23/2022 CBC and CMP are normal.   Tb Gold: 04/13/2021 Neg   Okay to refill Jenna Gordon?

## 2022-03-27 NOTE — Patient Instructions (Signed)
Standing Labs We placed an order today for your standing lab work.   Please have your standing labs drawn in April and every 3 months   Please have your labs drawn 2 weeks prior to your appointment so that the provider can discuss your lab results at your appointment, if possible.  Please note that you may see your imaging and lab results in [place] before we have reviewed them. We will contact you once all results are reviewed. Please allow our office up to 72 hours to thoroughly review all of the results before contacting the office for clarification of your results.  WALK-IN LAB HOURS  Monday through Thursday from 8:00 am -12:30 pm and 1:00 pm-5:00 pm and Friday from 8:00 am-12:00 pm.  Patients with office visits requiring labs will be seen before walk-in labs.  You may encounter longer than normal wait times. Please allow additional time. Wait times may be shorter on  Monday and Thursday afternoons.  We do not book appointments for walk-in labs. We appreciate your patience and understanding with our staff.   Labs are drawn by [facility]. Please bring your co-pay at the time of your lab draw.  You may receive a bill from [facility] for your lab work.  Please note if you are on Hydroxychloroquine and and an order has been placed for a Hydroxychloroquine level,  you will need to have it drawn 4 hours or more after your last dose.  If you wish to have your labs drawn at another location, please call the office 24 hours in advance so we can fax the orders.  The office is located at [address], [place], [place], [place] [address]   If you have any questions regarding directions or hours of operation,  please call [phone].   As a reminder, please drink plenty of water prior to coming for your lab work. Thanks!

## 2022-04-30 ENCOUNTER — Other Ambulatory Visit: Payer: Self-pay | Admitting: *Deleted

## 2022-04-30 DIAGNOSIS — Z111 Encounter for screening for respiratory tuberculosis: Secondary | ICD-10-CM

## 2022-04-30 DIAGNOSIS — Z9225 Personal history of immunosupression therapy: Secondary | ICD-10-CM

## 2022-04-30 DIAGNOSIS — Z79899 Other long term (current) drug therapy: Secondary | ICD-10-CM

## 2022-04-30 LAB — COMPLETE METABOLIC PANEL WITH GFR
AG Ratio: 1.5 (calc) (ref 1.0–2.5)
ALT: 22 U/L (ref 6–29)
Albumin: 4.5 g/dL (ref 3.6–5.1)
Alkaline phosphatase (APISO): 77 U/L (ref 37–153)
CO2: 25 mmol/L (ref 20–32)
Calcium: 9.4 mg/dL (ref 8.6–10.4)
Creat: 0.86 mg/dL (ref 0.50–1.03)
Total Bilirubin: 0.6 mg/dL (ref 0.2–1.2)
Total Protein: 7.6 g/dL (ref 6.1–8.1)

## 2022-04-30 LAB — CBC WITH DIFFERENTIAL/PLATELET
Basophils Absolute: 20 cells/uL (ref 0–200)
Basophils Relative: 0.4 %
MCH: 31.7 pg (ref 27.0–33.0)
RDW: 13.6 % (ref 11.0–15.0)

## 2022-05-01 NOTE — Progress Notes (Signed)
CBC and CMP normal

## 2022-05-02 LAB — CBC WITH DIFFERENTIAL/PLATELET
Absolute Monocytes: 296 cells/uL (ref 200–950)
Eosinophils Absolute: 250 cells/uL (ref 15–500)
Eosinophils Relative: 4.9 %
HCT: 42.4 % (ref 35.0–45.0)
Hemoglobin: 14.7 g/dL (ref 11.7–15.5)
Lymphs Abs: 1081 cells/uL (ref 850–3900)
MCHC: 34.7 g/dL (ref 32.0–36.0)
MCV: 91.6 fL (ref 80.0–100.0)
MPV: 10.3 fL (ref 7.5–12.5)
Monocytes Relative: 5.8 %
Neutro Abs: 3453 cells/uL (ref 1500–7800)
Neutrophils Relative %: 67.7 %
Platelets: 226 10*3/uL (ref 140–400)
RBC: 4.63 10*6/uL (ref 3.80–5.10)
Total Lymphocyte: 21.2 %
WBC: 5.1 10*3/uL (ref 3.8–10.8)

## 2022-05-02 LAB — COMPLETE METABOLIC PANEL WITH GFR
AST: 23 U/L (ref 10–35)
BUN: 20 mg/dL (ref 7–25)
Chloride: 104 mmol/L (ref 98–110)
Globulin: 3.1 g/dL (calc) (ref 1.9–3.7)
Glucose, Bld: 92 mg/dL (ref 65–99)
Potassium: 4.4 mmol/L (ref 3.5–5.3)
Sodium: 139 mmol/L (ref 135–146)
eGFR: 78 mL/min/{1.73_m2} (ref >=60)

## 2022-05-02 LAB — QUANTIFERON-TB GOLD PLUS
Mitogen-NIL: 8.16 IU/mL
NIL: 0.05 IU/mL
QuantiFERON-TB Gold Plus: NEGATIVE
TB1-NIL: 0.03 IU/mL
TB2-NIL: 0.04 IU/mL

## 2022-05-03 NOTE — Progress Notes (Signed)
TB Gold negative

## 2022-05-15 ENCOUNTER — Ambulatory Visit: Payer: 59 | Admitting: Family Medicine

## 2022-05-15 ENCOUNTER — Other Ambulatory Visit: Payer: Self-pay | Admitting: Family Medicine

## 2022-05-15 VITALS — BP 124/82 | HR 81 | Ht 63.0 in | Wt 158.2 lb

## 2022-05-15 DIAGNOSIS — U071 COVID-19: Secondary | ICD-10-CM | POA: Diagnosis not present

## 2022-05-15 DIAGNOSIS — J101 Influenza due to other identified influenza virus with other respiratory manifestations: Secondary | ICD-10-CM

## 2022-05-15 DIAGNOSIS — R399 Unspecified symptoms and signs involving the genitourinary system: Secondary | ICD-10-CM | POA: Diagnosis not present

## 2022-05-15 DIAGNOSIS — M545 Low back pain, unspecified: Secondary | ICD-10-CM | POA: Diagnosis not present

## 2022-05-15 LAB — POC SOFIA 2 FLU + SARS ANTIGEN FIA
Influenza A, POC: NEGATIVE
Influenza B, POC: POSITIVE — AB
SARS Coronavirus 2 Ag: POSITIVE — AB

## 2022-05-15 LAB — POCT UA - MICROSCOPIC ONLY: Epithelial cells, urine per micros: NONE SEEN

## 2022-05-15 LAB — POCT URINALYSIS DIP (MANUAL ENTRY)
Glucose, UA: NEGATIVE mg/dL
Nitrite, UA: NEGATIVE
Protein Ur, POC: 100 mg/dL — AB
Spec Grav, UA: 1.025 (ref 1.010–1.025)
Urobilinogen, UA: 1 E.U./dL
pH, UA: 6 (ref 5.0–8.0)

## 2022-05-15 MED ORDER — BENZONATATE 100 MG PO CAPS
200.0000 mg | ORAL_CAPSULE | Freq: Two times a day (BID) | ORAL | 0 refills | Status: DC | PRN
Start: 2022-05-15 — End: 2022-08-29

## 2022-05-15 MED ORDER — OSELTAMIVIR PHOSPHATE 75 MG PO CAPS: 75.0000 mg | ORAL_CAPSULE | Freq: Two times a day (BID) | ORAL | 0 refills | Status: AC

## 2022-05-15 MED ORDER — CEPHALEXIN 500 MG PO CAPS
500.0000 mg | ORAL_CAPSULE | Freq: Two times a day (BID) | ORAL | 0 refills | Status: AC
Start: 2022-05-15 — End: 2022-05-22

## 2022-05-15 NOTE — Progress Notes (Signed)
    SUBJECTIVE:   CHIEF COMPLAINT / HPI:   Jenna Gordon is a 57 y.o. female who presents to the [facility] clinic today to discuss the following concerns:   Back Pain Started on Friday to lower right back. Then moved to left lower back. Feels like an aching discomfort. No injuries that she can recall.   Sick Symptoms Started to feel sick yesterday so she took Theraflu. Was having body aches. Sore throat since yesterday. No cough but does have congestion and runny nose. Did have a frontal headache as well. No sick contacts. No recent travel. Vaccines UTD. She is immunosuppressed as she is on methotrexate and xeljanz for her RA.   Urinary symptoms Onset: Today Dysuria: present - since today Increased urinary frequency: Yes  Urinary urgency: Yes  Incontinence: No  Abdominal pain: No  Hematuria:  No but urine is dark Nausea/vomiting: No  Flank pain: Yes  Fever: No  Has had UTI before, this feels similar. Has never had renal stones or pyelonephritis.   PERTINENT  PMH / PSH: RA  OBJECTIVE:   BP 124/82   Pulse 81   Ht  (1.6 m)   Wt 158 lb 4 oz (71.8 kg)   LMP 05/05/2014 (Approximate)   SpO2 100%   BMI 28.03 kg/m    General: NAD, pleasant, able to participate in exam HEENT: Normocephalic, nares with congestion and rhinorrhea, TM clear b/l, oropharynx clear without erythema or tonsillar exudates  Neck: Supple, right posterior cervical LAD,  Cardiac: RRR, no murmurs. Respiratory: CTAB, normal effort, No wheezes, rales or rhonchi Abdomen: Bowel sounds present, nontender, nondistended Back: pain to palpation of lumbar paraspinal muscles bilaterally. Small area of ecchymosis to right lower back (see below) with pain on palpation of area. No CVAT bilaterally  Psych: Normal affect and mood    ASSESSMENT/PLAN:   1. UTI symptoms History and UA consistent with UTI. Has had pansensitive e. Coli UTI in the past.  Had childhood allergy to Cefprozil but has tolerated Keflex in the  past. - POCT urinalysis dipstick - POCT UA - Microscopic Only - cephALEXin (KEFLEX) 500 MG capsule; Take 1 capsule (500 mg total) by mouth 2 (two) times daily for 7 days.  Dispense: 14 capsule; Refill: 0 - Recommend Azo for symptomatic relief  2. COVID-19 Day 2 of symptoms.  Discussed with pharmacy team, no antiviral is safe given drug interactions with her RA medications. Supportive care for now.  - benzonatate (TESSALON) 100 MG capsule; Take 2 capsules (200 mg total) by mouth 2 (two) times daily as needed for cough.  Dispense: 20 capsule; Refill: 0 - POC SOFIA 2 FLU + SARS ANTIGEN FIA - Alternate Tylenol/Ibuprofen PRN pain, fever - Return precautions   3. Influenza B Tamiflu sent.  - benzonatate (TESSALON) 100 MG capsule; Take 2 capsules (200 mg total) by mouth 2 (two) times daily as needed for cough.  Dispense: 20 capsule; Refill: 0 - POC SOFIA 2 FLU + SARS ANTIGEN FIA - Return precautions   4. Acute bilateral low back pain without sciatica Seems more MSK in etiology. No CVAT, and urinary symptoms just started today so feel pyelo is less likely. Did have an area of ecchymosis to back and was ttp there. She may have muscle strain given pain to palpation of b/l paraspinals.  -Conservative measures with heat and Tylenol PRN -Recommend topical Voltaren gel  -Return if worsening or no improvement    Sabino Dick, DO [facility]

## 2022-05-15 NOTE — Patient Instructions (Addendum)
It was wonderful to see you today.  Please bring ALL of your medications with you to every visit.   Today we talked about:  We will do tests to check for COVID and Flu.  Take Flonase daily. You can alternate Tylenol and Ibuprofen as needed for pain or fever. You can use Voltaren gel and heating pack to your back.   I will treat you for a urinary tract infection.  You can also take over the counter Azo for your symptoms.   Thank you for coming to your visit as scheduled. We have had a large "no-show" problem lately, and this significantly limits our ability to see and care for patients. As a friendly reminder- if you cannot make your appointment please call to cancel. We do have a no show policy for those who do not cancel within 24 hours. Our policy is that if you miss or fail to cancel an appointment within 24 hours, 3 times in a 80-month period, you may be dismissed from our clinic.   Thank you for choosing [facility].   Please call [phone] with any questions about today's appointment.  Please be sure to schedule follow up at the front  desk before you leave today.   Sabino Dick, DO PGY-3 Family Medicine

## 2022-05-15 NOTE — Addendum Note (Signed)
Addended by: Sabino Dick on: 05/15/2022 04:58 PM   Modules accepted: Orders

## 2022-05-17 LAB — URINE CULTURE

## 2022-05-18 ENCOUNTER — Telehealth: Payer: Self-pay

## 2022-05-18 ENCOUNTER — Encounter: Payer: Self-pay | Admitting: Family Medicine

## 2022-05-18 ENCOUNTER — Other Ambulatory Visit: Payer: Self-pay | Admitting: Family Medicine

## 2022-05-18 DIAGNOSIS — U071 COVID-19: Secondary | ICD-10-CM

## 2022-05-18 LAB — URINE CULTURE

## 2022-05-18 MED ORDER — ALBUTEROL SULFATE HFA 108 (90 BASE) MCG/ACT IN AERS
2.0000 | INHALATION_SPRAY | RESPIRATORY_TRACT | 0 refills | Status: AC | PRN
Start: 2022-05-18 — End: ?

## 2022-05-18 NOTE — Telephone Encounter (Signed)
Patient calls nurse line regarding follow up from visit on 05/15/22.  She states that she tested positive for COVID and flu.   She reports Sp02 ranging between 91-92% on room air. Feels that she has been having intermittent slight SHOB. No SHOB during conversation. She is able to speak in complete sentences.   Chest and upper back pain since 4/23. Was feeling nauseous this morning, laid down and rested, she is now feeling better.   She is alternating tylenol and ibuprofen. Last night temperature was 100.4.  Discussed supportive measures and ED precautions.   Patient states that she was given albuterol inhaler in the past when she had URI. She is asking if she could have this sent in for PRN use.   Please advise.   Veronda Prude, RN

## 2022-06-12 ENCOUNTER — Other Ambulatory Visit: Payer: Self-pay | Admitting: Physician Assistant

## 2022-06-12 NOTE — Telephone Encounter (Signed)
Last Fill: 03/27/2022  Labs: 04/30/2022 CBC and CMP normal.   TB Gold: 04/30/2022  Neg   Next Visit: 08/29/2022  Last Visit: 03/27/2022  [id number] arthritis involving multiple sites with positive rheumatoid factor   Current Dose per office note 03/27/2022: Harriette Ohara 11 mg XR by mouth daily   Okay to refill Harriette Ohara?

## 2022-06-19 ENCOUNTER — Other Ambulatory Visit: Payer: Self-pay | Admitting: Family Medicine

## 2022-06-29 ENCOUNTER — Other Ambulatory Visit: Payer: Self-pay | Admitting: *Deleted

## 2022-06-29 DIAGNOSIS — Z79899 Other long term (current) drug therapy: Secondary | ICD-10-CM

## 2022-06-29 LAB — CBC WITH DIFFERENTIAL/PLATELET
Basophils Absolute: 20 cells/uL (ref 0–200)
Basophils Relative: 0.3 %
HCT: 36.1 % (ref 35.0–45.0)
Hemoglobin: 12.1 g/dL (ref 11.7–15.5)
MCHC: 33.5 g/dL (ref 32.0–36.0)
Neutrophils Relative %: 68.6 %
Total Lymphocyte: 21.8 %

## 2022-06-30 LAB — COMPLETE METABOLIC PANEL WITH GFR
AG Ratio: 1.4 (calc) (ref 1.0–2.5)
ALT: 19 U/L (ref 6–29)
AST: 18 U/L (ref 10–35)
Albumin: 4.2 g/dL (ref 3.6–5.1)
Alkaline phosphatase (APISO): 70 U/L (ref 37–153)
BUN: 21 mg/dL (ref 7–25)
CO2: 25 mmol/L (ref 20–32)
Calcium: 8.9 mg/dL (ref 8.6–10.4)
Chloride: 108 mmol/L (ref 98–110)
Creat: 1.01 mg/dL (ref 0.50–1.03)
Globulin: 3 g/dL (calc) (ref 1.9–3.7)
Glucose, Bld: 96 mg/dL (ref 65–99)
Potassium: 4.6 mmol/L (ref 3.5–5.3)
Sodium: 141 mmol/L (ref 135–146)
Total Bilirubin: 0.2 mg/dL (ref 0.2–1.2)
Total Protein: 7.2 g/dL (ref 6.1–8.1)
eGFR: 65 mL/min/{1.73_m2} (ref >=60)

## 2022-06-30 LAB — CBC WITH DIFFERENTIAL/PLATELET
Absolute Monocytes: 475 cells/uL (ref 200–950)
Eosinophils Absolute: 139 cells/uL (ref 15–500)
Eosinophils Relative: 2.1 %
Lymphs Abs: 1439 cells/uL (ref 850–3900)
MCH: 31.9 pg (ref 27.0–33.0)
MCV: 95.3 fL (ref 80.0–100.0)
MPV: 10.3 fL (ref 7.5–12.5)
Monocytes Relative: 7.2 %
Neutro Abs: 4528 cells/uL (ref 1500–7800)
Platelets: 232 10*3/uL (ref 140–400)
RBC: 3.79 10*6/uL — ABNORMAL LOW (ref 3.80–5.10)
RDW: 14.6 % (ref 11.0–15.0)
WBC: 6.6 10*3/uL (ref 3.8–10.8)

## 2022-06-30 NOTE — Progress Notes (Signed)
CBC and CMP are stable.

## 2022-07-30 ENCOUNTER — Other Ambulatory Visit: Payer: Self-pay | Admitting: *Deleted

## 2022-07-30 MED ORDER — METHOTREXATE SODIUM 2.5 MG PO TABS: ORAL_TABLET | ORAL | 0 refills | Status: DC

## 2022-07-30 NOTE — Telephone Encounter (Signed)
Last Fill: 03/27/2022  Labs: 06/29/2022 CBC and CMP are stable.   Next Visit: 08/29/2022  Last Visit: 03/27/2022  DX: Rheumatoid arthritis involving multiple sites with positive rheumatoid factor   Current Dose per office note 03/27/2022: methotrexate 6 tablets by mouth once weekly   Okay to refill Methotrexate?

## 2022-08-15 NOTE — Progress Notes (Signed)
Office Visit Note  Patient: Jenna Gordon             Date of Birth: 1965/11/03           MRN: [id number]             PCP: Latrelle Dodrill, MD Referring: Latrelle Dodrill, MD Visit Date: 08/29/2022 Occupation: @GUAROCC @  Subjective:  Joint stiffness  History of Present Illness: Jenna Gordon is a 57 y.o. female with seropositive rheumatoid arthritis.  Patient states she developed flu and COVID-19 virus infection in April 2024.  She had to stop [place] and methotrexate for about couple of weeks.  She was on Tamiflu but could not get Paxlovid due to some drug interaction.  She states she had some shortness of breath which resolved.  When she recovered she restarted her medications.  She states she developed a flare of rheumatoid arthritis with pain and swelling in almost all of her joints.  She is doing better now.  She denies any joint pain or joint swelling today.  She continues to have some morning stiffness.  She also have dry mouth and dry eyes.    Activities of Daily Living:  Patient reports morning stiffness for 1.5-2 hours.   Patient Reports nocturnal pain.  Difficulty dressing/grooming: Denies Difficulty climbing stairs: Reports Difficulty getting out of chair: Denies Difficulty using hands for taps, buttons, cutlery, and/or writing: Reports  Review of Systems  Constitutional:  Negative for fatigue.  HENT:  Positive for mouth dryness. Negative for mouth sores.   Eyes:  Positive for dryness.  Respiratory:  Negative for shortness of breath.   Cardiovascular:  Negative for chest pain and palpitations.  Gastrointestinal:  Negative for blood in stool, constipation and diarrhea.  Endocrine: Negative for increased urination.  Genitourinary:  Negative for involuntary urination.  Musculoskeletal:  Positive for joint pain, joint pain, morning stiffness and muscle tenderness. Negative for gait problem, joint swelling, myalgias, muscle weakness and myalgias.  Skin:  Positive  for hair loss and sensitivity to sunlight. Negative for color change and rash.  Allergic/Immunologic: Positive for susceptible to infections.  Neurological:  Negative for dizziness and headaches.  Hematological:  Negative for swollen glands.  Psychiatric/Behavioral:  Negative for depressed mood and sleep disturbance. The patient is not nervous/anxious.     PMFS History:  Patient Active Problem List   Diagnosis Date Noted   Nasal congestion 10/18/2021   Nasal septal deviation 10/18/2021   Chronic bronchitis (HCC) 03/06/2021   Hoarseness 03/06/2021   Stress incontinence 12/06/2020   Rib pain on right side 04/13/2020   High risk HPV infection 12/10/2019   Mouth lesion 06/01/2019   Depression, recurrent (HCC) 06/01/2019   Corneal irritation of both eyes 03/04/2019   Fibroid uterus 06/20/2018   Grade II internal hemorrhoids 06/20/2018   Mixed hyperlipidemia 03/14/2016   Environmental allergies 03/14/2016   Gastroesophageal reflux disease without esophagitis 02/24/2015   Osteopenia 02/24/2015   Rash 07/14/2014   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   Raynaud phenomenon 04/11/2012   ANEMIA, IRON DEFICIENCY 09/08/2009   SJOGREN'S SYNDROME 01/06/2008   Rheumatoid arthritis of multiple sites with negative rheumatoid factor (HCC) 04/09/2007    Past Medical History:  Diagnosis Date   Amnesia memory loss 10/14/2012   Anemia 2009   Arthritis    Dry eyes    GERD (gastroesophageal reflux disease)    History of glaucoma    s/p   bilateral eye laser treatment  2007   History of traumatic head injury 10/14/2012  Lupus (systemic lupus erythematosus) (HCC)    Positive TB test    pt states cxr negative was tested due to treatment for rheumatoid arthritis since test +  , was told to take medication for tb for 9 months before going back on humira injection   Rheumatoid arthritis(714.0)    Dr. Ed Blalock, [facility]   Sjogren's disease [facility])    SUI (stress urinary incontinence, female)     Wears glasses     Family History  Problem Relation Age of Onset   CVA Mother        TIA   Hypertension Mother    Cystic fibrosis Mother    Stomach cancer Father    Diabetes Father    Hypertension Father    Hypertension Brother    Hypertension Brother    Healthy Daughter    Healthy Daughter    Past Surgical History:  Procedure Laterality Date   CARPAL TUNNEL RELEASE Bilateral 2007   CERVICAL CONIZATION W/BX  1994   CESAREAN SECTION  x2  last one 1999   GLAUCOMA SURGERY Bilateral 2007   laser   HYSTEROSCOPY WITH D & C  03-02-2010   MASS EXCISION Left 05/21/2013   Procedure: EXCISION MASS LEFT LONG FINGER;  Surgeon: Sharma Covert, MD;  Location: [facility];  Service: Orthopedics;  Laterality: Left;   NASAL SEPTOPLASTY W/ TURBINOPLASTY Bilateral 10/18/2021   Procedure: NASAL SEPTOPLASTY WITH TURBINATE REDUCTION AND REPAIR OF NASAL VALVE COLLAPSE;  Surgeon: Laren Boom, DO;  Location: [facility] OR;  Service: ENT;  Laterality: Bilateral;   SHOULDER ARTHROSCOPY WITH OPEN ROTATOR CUFF REPAIR Right 2012   TENOSYNOVECTOMY Left 05/21/2013   Procedure: LEFT INDEX FINGER TENOSYNOVECTOMY;  Surgeon: Sharma Covert, MD;  Location: [facility];  Service: Orthopedics;  Laterality: Left;   TRIGGER FINGER RELEASE  2009   left middle finger   TYMPANOPLASTY Left 1993   Social History   Social History Narrative   Lives with Santiago Glad 87yo, Daughter, April 21yo, Pets Scout and [place]son.    Immunization History  Administered Date(s) Administered   Influenza Split 10/02/2011   Influenza Whole 11/05/2008, 10/24/2009   Influenza, High Dose Seasonal PF 09/23/2014   Influenza,inj,Quad PF,6+ Mos 10/14/2012, 10/15/2016, 09/24/2017, 10/23/2018, 10/06/2020, 09/28/2021   Influenza-Unspecified 10/02/2011, 10/14/2012, 11/23/2015, 10/15/2016, 09/24/2017, 10/07/2019   PFIZER(Purple Top)SARS-COV-2 Vaccination 03/16/2019, 04/06/2019, 09/08/2019, 03/10/2020   PPD Test 02/11/2013    Pfizer Covid-19 Vaccine Bivalent Booster 42yrs & up 12/06/2020, 10/22/2021   Pneumococcal Conjugate-13 07/27/2016   Pneumococcal Polysaccharide-23 12/06/2020   Td 08/06/2002   Tdap 02/06/2019   Zoster Recombinant(Shingrix) 04/13/2020, 06/13/2020     Objective: Vital Signs: BP 110/75 (BP Location: Left Arm, Patient Position: Sitting, Cuff Size: Normal)   Pulse 66   Resp 16   Ht 5\' 3"  (1.6 m)   Wt 161 lb 6.4 oz (73.2 kg)   LMP 05/05/2014 (Approximate)   BMI 28.59 kg/m    Physical Exam Vitals and nursing note reviewed.  Constitutional:      Appearance: She is well-developed.  HENT:     Head: Normocephalic and atraumatic.  Eyes:     Conjunctiva/sclera: Conjunctivae normal.  Cardiovascular:     Rate and Rhythm: Normal rate and regular rhythm.     Heart sounds: Normal heart sounds.  Pulmonary:     Effort: Pulmonary effort is normal.     Breath sounds: Normal breath sounds.  Abdominal:     General: Bowel sounds are normal.  Palpations: Abdomen is soft.  Musculoskeletal:     Cervical back: Normal range of motion.  Lymphadenopathy:     Cervical: No cervical adenopathy.  Skin:    General: Skin is warm and dry.     Capillary Refill: Capillary refill takes less than 2 seconds.  Neurological:     Mental Status: She is alert and oriented to person, place, and time.  Psychiatric:        Behavior: Behavior normal.      Musculoskeletal Exam: Cervical spine with good range of motion.  She had no tenderness over thoracic lumbar spine.  Shoulder joints, elbow joints, wrist joints, MCPs PIPs and DIPs were in good range of motion.  She had ulnar deviation of her MCPs.  MCP PIP and DIP thickening was noted.  No synovitis was noted.  Hip joint and knee joint and good range of motion without any warmth swelling or effusion.  There was no tenderness over ankles or MTPs.  CDAI Exam: CDAI Score: -- Patient Global: 0 / 100; Provider Global: 0 / 100 Swollen: --; Tender: -- Joint Exam  08/29/2022   No joint exam has been documented for this visit   There is currently no information documented on the homunculus. Go to the Rheumatology activity and complete the homunculus joint exam.  Investigation: No additional findings.  Imaging: No results found.  Recent Labs: Lab Results  Component Value Date   WBC 6.6 06/29/2022   HGB 12.1 06/29/2022   PLT 232 06/29/2022   NA 141 06/29/2022   K 4.6 06/29/2022   CL 108 06/29/2022   CO2 25 06/29/2022   GLUCOSE 96 06/29/2022   BUN 21 06/29/2022   CREATININE 1.01 06/29/2022   BILITOT 0.2 06/29/2022   ALKPHOS 73 04/11/2012   AST 18 06/29/2022   ALT 19 06/29/2022   PROT 7.2 06/29/2022   ALBUMIN 4.1 04/11/2012   CALCIUM 8.9 06/29/2022   GFRAA 115 03/22/2020   QFTBGOLDPLUS NEGATIVE 04/30/2022    Speciality Comments: No specialty comments available.  Procedures:  No procedures performed Allergies: Cefzil [cefprozil], Macrobid [nitrofurantoin], and Sulfa antibiotics   Assessment / Plan:     Visit Diagnoses: Rheumatoid arthritis involving multiple sites with positive rheumatoid factor (HCC) - +RF,+anti-CCP, +14-3-3n, Diagnosed in 2006: -Patient denies any increased joint pain or joint swelling recently.  Although she had a flare of her rheumatoid arthritis when she had to come off [place] and methotrexate for COVID-19 virus infection in April 2024.  She states after resuming medications her symptoms gradually improved.  She requested refill for Harriette Ohara today.  Plan: Tofacitinib Citrate ER (XELJANZ XR) 11 MG TB24  High risk medication use - Xeljanz 11 mg XR by mouth daily, methotrexate 6 tablets by mouth once weekly and folic acid 2 mg daily.  Lipid panel was on January 23, 2022.  TB Gold was negative on April 30, 2022.  June 29, 2022 CBC and CMP were within normal limits.  She will get repeat labs in September along with fasting lipid panel.  Information about immunization was placed in the AVS.  She was advised to hold Harriette Ohara  if she develops an infection resume after the infection resolves.  Annual skin examination to screen for skin cancer was advised.  Use of sunscreen and sun protection was discussed.  FTA blackbox warning associated with the Harriette Ohara use was also reviewed including major adverse cardiovascular events, thrombosis, serious infections, lymphoma, myocardial infarction, stroke, pulmonary embolism.  Patient voiced understanding.  Rheumatoid nodulosis (HCC) -  Resolved.  Sicca syndrome (HCC)-she continues to have dry mouth and dry eyes.  Over-the-counter products were discussed.  Raynaud's phenomenon without gangrene-currently not symptomatic.  Chondromalacia of both patellae-she is not having much discomfort now.  Lower extremity muscle strength exercises were discussed.  Osteopenia of multiple sites - DEXA followed by PCP.  Patient does not recall when she had the last DEXA scan.  I advised her to discuss with her PCP.  Use of calcium rich diet and vitamin D was advised.  Regular exercise was advised.  Other medical problems are listed as follows:  Gastroesophageal reflux disease without esophagitis  Mixed hyperlipidemia -last lipid panel was in January.  Will check lipid panel with next labs.  Plan: Lipid panel  Mild episode of recurrent major depressive disorder (HCC)  COVID-19 virus infection - 04/24.  Patient states she had COVID-19 virus infection and flu in April.  She states she was very sick with some shortness of breath.  Symptoms eventually resolved.  She could not take Paxlovid.  Orders: Orders Placed This Encounter  Procedures   Lipid panel   Meds ordered this encounter  Medications   Tofacitinib Citrate ER (XELJANZ XR) 11 MG TB24    Sig: Take 1 tablet (11 mg total) by mouth daily.    Dispense:  90 tablet    Refill:  0     Follow-Up Instructions: Return in about 5 months (around 01/29/2023) for Rheumatoid arthritis.   Pollyann Savoy, MD  Note - This record has been  created using Animal nutritionist.  Chart creation errors have been sought, but may not always  have been located. Such creation errors do not reflect on  the standard of medical care.

## 2022-08-29 ENCOUNTER — Encounter: Payer: Self-pay | Admitting: Rheumatology

## 2022-08-29 ENCOUNTER — Ambulatory Visit: Payer: 59 | Attending: Rheumatology | Admitting: Rheumatology

## 2022-08-29 VITALS — BP 110/75 | HR 66 | Resp 16 | Ht 63.0 in | Wt 161.4 lb

## 2022-08-29 DIAGNOSIS — Z79899 Other long term (current) drug therapy: Secondary | ICD-10-CM | POA: Diagnosis not present

## 2022-08-29 DIAGNOSIS — M0630 Rheumatoid nodule, unspecified site: Secondary | ICD-10-CM

## 2022-08-29 DIAGNOSIS — I7300 Raynaud's syndrome without gangrene: Secondary | ICD-10-CM

## 2022-08-29 DIAGNOSIS — E782 Mixed hyperlipidemia: Secondary | ICD-10-CM

## 2022-08-29 DIAGNOSIS — M0579 Rheumatoid arthritis with rheumatoid factor of multiple sites without organ or systems involvement: Secondary | ICD-10-CM

## 2022-08-29 DIAGNOSIS — M2242 Chondromalacia patellae, left knee: Secondary | ICD-10-CM

## 2022-08-29 DIAGNOSIS — M2241 Chondromalacia patellae, right knee: Secondary | ICD-10-CM

## 2022-08-29 DIAGNOSIS — K219 Gastro-esophageal reflux disease without esophagitis: Secondary | ICD-10-CM

## 2022-08-29 DIAGNOSIS — F330 Major depressive disorder, recurrent, mild: Secondary | ICD-10-CM

## 2022-08-29 DIAGNOSIS — M8589 Other specified disorders of bone density and structure, multiple sites: Secondary | ICD-10-CM

## 2022-08-29 DIAGNOSIS — M3500 Sicca syndrome, unspecified: Secondary | ICD-10-CM

## 2022-08-29 DIAGNOSIS — U071 COVID-19: Secondary | ICD-10-CM

## 2022-08-29 MED ORDER — XELJANZ XR 11 MG PO TB24
1.0000 | ORAL_TABLET | Freq: Every day | ORAL | 0 refills | Status: DC
Start: 2022-08-29 — End: 2023-04-15

## 2022-08-29 NOTE — Patient Instructions (Addendum)
Standing Labs We placed an order today for your standing lab work.   Please have your standing labs drawn in September and every 3 months  Please have your labs drawn 2 weeks prior to your appointment so that the provider can discuss your lab results at your appointment, if possible.  Please note that you may see your imaging and lab results in MyChart before we have reviewed them. We will contact you once all results are reviewed. Please allow our office up to 72 hours to thoroughly review all of the results before contacting the office for clarification of your results.  WALK-IN LAB HOURS  Monday through Thursday from 8:00 am -12:30 pm and 1:00 pm-5:00 pm and Friday from 8:00 am-12:00 pm.  Patients with office visits requiring labs will be seen before walk-in labs.  You may encounter longer than normal wait times. Please allow additional time. Wait times may be shorter on  Monday and Thursday afternoons.  We do not book appointments for walk-in labs. We appreciate your patience and understanding with our staff.   Labs are drawn by [facility]. Please bring your co-pay at the time of your lab draw.  You may receive a bill from [facility] for your lab work.  Please note if you are on Hydroxychloroquine and and an order has been placed for a Hydroxychloroquine level,  you will need to have it drawn 4 hours or more after your last dose.  If you wish to have your labs drawn at another location, please call the office 24 hours in advance so we can fax the orders.  The office is located at [address], [address], [place], [place] [address]  Vaccines You are taking a medication(s) that can suppress your immune system.  The following immunizations are recommended: Flu annually Covid-19  RSV Td/Tdap (tetanus, diphtheria, pertussis) every 10 years Pneumonia (Prevnar 15 then Pneumovax 23 at least 1 year apart.  Alternatively, can take Prevnar 20 without needing additional dose) Shingrix: 2 doses from  4 weeks to 6 months apart  Please check with your PCP to make sure you are up to date.  If you have any questions regarding directions or hours of operation,  please call [phone].   As a reminder, please drink plenty of water prior to coming for your lab work. Thanks!   If you have signs or symptoms of an infection or start antibiotics: First, call your PCP for workup of your infection. Hold your medication through the infection, until you complete your antibiotics, and until symptoms resolve if you take the following: Injectable medication (Actemra, Benlysta, Cimzia, Cosentyx, Enbrel, Humira, Kevzara, Orencia, Remicade, Simponi, Stelara, Taltz, Tremfya) Methotrexate Leflunomide (Arava) Mycophenolate (Cellcept) Osborne [place], or Rinvoq   Because you are taking Harriette Ohara, Rinvoq, or Olumiant, it is very important to know that this class of medications has a FDA BLACK BOX WARNING for major adverse cardiovascular events (MACE), thrombosis, mortality (including sudden cardiovascular death), serious infections, and lymphomas. MACE is defined as cardiovascular death, myocardial infarction, and stroke. Thrombosis includes deep venous thrombosis (DVT), pulmonary embolism (PE), and arterial thrombosis. If you are a current or former smoker, you are at higher risk for MACE.  Please get an annual skin examination to screen for skin cancer.  Please use sunscreen and sun protection.

## 2022-09-28 ENCOUNTER — Telehealth: Payer: Self-pay | Admitting: Rheumatology

## 2022-09-28 ENCOUNTER — Other Ambulatory Visit: Payer: Self-pay

## 2022-09-28 DIAGNOSIS — E782 Mixed hyperlipidemia: Secondary | ICD-10-CM

## 2022-09-28 DIAGNOSIS — Z79899 Other long term (current) drug therapy: Secondary | ICD-10-CM

## 2022-09-28 NOTE — Telephone Encounter (Signed)
Labs: 06/29/2022 CBC and CMP are stable.   TB Gold: 04/30/2022 negative    Next Visit: 02/05/2023  Last Visit: 08/29/2022  [id number] arthritis involving multiple sites with positive rheumatoid factor   Current Dose per office note on 08/29/2022: Xeljanz 11 mg XR by mouth daily  Patient updated labs today in the office.    Medication Samples have been provided to the patient.  Drug name: Harriette Ohara       Strength: 11mg         Qty: 1 bottle  LOT: [id number]  Exp.Date: 02/2023  Dosing instructions: Take 1 tablet (11mg ) by mouth daily.

## 2022-09-28 NOTE — Telephone Encounter (Signed)
Patient states her she doesn't have anymore money remaining on her copay card for [place].  Patient states she is out of medication and cannot afford the medication.  Patient was told to contact our office for samples.

## 2022-09-29 LAB — CBC WITH DIFFERENTIAL/PLATELET
Absolute Monocytes: 417 {cells}/uL (ref 200–950)
Basophils Absolute: 12 {cells}/uL (ref 0–200)
Basophils Relative: 0.3 %
Eosinophils Absolute: 59 {cells}/uL (ref 15–500)
Eosinophils Relative: 1.5 %
HCT: 42.8 % (ref 35.0–45.0)
Hemoglobin: 13.9 g/dL (ref 11.7–15.5)
Lymphs Abs: 944 {cells}/uL (ref 850–3900)
MCH: 30.5 pg (ref 27.0–33.0)
MCHC: 32.5 g/dL (ref 32.0–36.0)
MCV: 93.9 fL (ref 80.0–100.0)
MPV: 10.8 fL (ref 7.5–12.5)
Monocytes Relative: 10.7 %
Neutro Abs: 2469 {cells}/uL (ref 1500–7800)
Neutrophils Relative %: 63.3 %
Platelets: 191 10*3/uL (ref 140–400)
RBC: 4.56 10*6/uL (ref 3.80–5.10)
RDW: 13.1 % (ref 11.0–15.0)
Total Lymphocyte: 24.2 %
WBC: 3.9 10*3/uL (ref 3.8–10.8)

## 2022-09-29 LAB — COMPLETE METABOLIC PANEL WITH GFR
AG Ratio: 1.3 (calc) (ref 1.0–2.5)
ALT: 23 U/L (ref 6–29)
AST: 20 U/L (ref 10–35)
Albumin: 4.2 g/dL (ref 3.6–5.1)
Alkaline phosphatase (APISO): 70 U/L (ref 37–153)
BUN: 18 mg/dL (ref 7–25)
CO2: 24 mmol/L (ref 20–32)
Calcium: 9.4 mg/dL (ref 8.6–10.4)
Chloride: 107 mmol/L (ref 98–110)
Creat: 0.79 mg/dL (ref 0.50–1.03)
Globulin: 3.3 g/dL (ref 1.9–3.7)
Glucose, Bld: 88 mg/dL (ref 65–99)
Potassium: 4.4 mmol/L (ref 3.5–5.3)
Sodium: 140 mmol/L (ref 135–146)
Total Bilirubin: 0.5 mg/dL (ref 0.2–1.2)
Total Protein: 7.5 g/dL (ref 6.1–8.1)
eGFR: 87 mL/min/{1.73_m2} (ref >=60)

## 2022-09-29 LAB — LIPID PANEL
Cholesterol: 184 mg/dL (ref <200)
HDL: 62 mg/dL (ref >=50)
LDL Cholesterol (Calc): 100 mg/dL — ABNORMAL HIGH
Non-HDL Cholesterol (Calc): 122 mg/dL (ref <130)
Total CHOL/HDL Ratio: 3 (calc) (ref <5.0)
Triglycerides: 123 mg/dL (ref <150)

## 2022-09-30 NOTE — Progress Notes (Signed)
LDL is mildly elevated.  CBC and CMP normal.  Please forward results to her PCP.

## 2022-10-01 NOTE — Telephone Encounter (Signed)
Returned call to patient regarding Jenna Gordon copay assistance. She states she was referred to insurance and copay card by pharmacy. She called copay card company who stated that they do not have additional options for her and referred her back to our office for samples. They advised her that they have reduced copay card annual funds  Left VM for Jenna Gordon, [facility] for Jenna Gordon to confirm that patient has indeed run out of all Jenna Gordon funds  If patient is confirmed to have run out of all options, then she will need to be provided with samples to tide her over to 01/23/2023  Jenna Gordon, PharmD, MPH, BCPS, CPP Clinical Pharmacist (Rheumatology and Pulmonology)

## 2022-10-02 NOTE — Telephone Encounter (Signed)
Received return call from [place], [place]. Per rep, she is not enrolled into [employer] portal so he cannot see how her insurance is built. Patient will need to call [employer] to complete this BIV. I spoke withpatient - she states that she already call the number below. I recommended she try again and rquest what other optiosn they have. She will f/u with clinic with the answer she receives  Phone: [phone]  Chesley Mires, PharmD, MPH, BCPS, CPP Clinical Pharmacist (Rheumatology and Pulmonology)

## 2022-10-10 ENCOUNTER — Ambulatory Visit (INDEPENDENT_AMBULATORY_CARE_PROVIDER_SITE_OTHER): Payer: 59 | Admitting: Family Medicine

## 2022-10-10 ENCOUNTER — Encounter: Payer: Self-pay | Admitting: Family Medicine

## 2022-10-10 VITALS — BP 115/86 | HR 67 | Ht 63.0 in | Wt 162.4 lb

## 2022-10-10 DIAGNOSIS — M8589 Other specified disorders of bone density and structure, multiple sites: Secondary | ICD-10-CM | POA: Diagnosis not present

## 2022-10-10 DIAGNOSIS — E782 Mixed hyperlipidemia: Secondary | ICD-10-CM

## 2022-10-10 DIAGNOSIS — K219 Gastro-esophageal reflux disease without esophagitis: Secondary | ICD-10-CM | POA: Diagnosis not present

## 2022-10-10 DIAGNOSIS — Z9189 Other specified personal risk factors, not elsewhere classified: Secondary | ICD-10-CM

## 2022-10-10 DIAGNOSIS — Z1231 Encounter for screening mammogram for malignant neoplasm of breast: Secondary | ICD-10-CM

## 2022-10-10 DIAGNOSIS — F339 Major depressive disorder, recurrent, unspecified: Secondary | ICD-10-CM

## 2022-10-10 MED ORDER — PANTOPRAZOLE SODIUM 40 MG PO TBEC: 40.0000 mg | DELAYED_RELEASE_TABLET | Freq: Every day | ORAL | 3 refills | Status: DC

## 2022-10-10 NOTE — Progress Notes (Signed)
  Date of Visit: 10/10/2022   SUBJECTIVE:   HPI:  Jenna Gordon presents today for routine follow-up.  Lab review: Recently had labs drawn by rheumatologist.  She wanted to discuss these today, specifically her cholesterol.  Not on any cholesterol medications.  LDL was mildly elevated at 100.  GERD: Requests refill of Protonix.  Takes this daily, controls symptoms well.  If she does not take it she has significant heartburn.  Depression: Has stopped her sertraline a few months ago.  Doing well without it.  She tapered herself down.  Typically goes for years without needing it, and then when depression flares she restarts it.  Denies thoughts of harming herself or others.  Pap smear: Previously had stenotic cervix, positive HPV, normal cytology.  Has seen Dr. Shawnie Pons in [facility] for OB/GYN care.  She asked about doing a Pap smear today.  Osteopenia: Carries diagnosis of osteopenia.  Asks whether she needs to repeat a bone density test.  It has been years since she had one.   OBJECTIVE:   BP 115/86   Pulse 67   Ht 5\' 3"  (1.6 m)   Wt 162 lb 6 oz (73.7 kg)   LMP 05/05/2014 (Approximate)   SpO2 100%   BMI 28.76 kg/m  Gen: No acute distress, pleasant, cooperative, well-appearing HEENT: Normocephalic, atraumatic Lungs: Normal work of breathing on room air Neuro: Grossly nonfocal, speech normal Psych: normal range of affect, well groomed, speech normal in rate and volume, normal eye contact   ASSESSMENT/PLAN:   Health maintenance:  -Encouraged her to follow-up with OB/GYN for her Pap smears given her cervical stenosis and complex Pap history, she will schedule -mammogram ordered -Up-to-date on COVID and flu vaccines  Assessment & Plan Osteopenia of multiple sites No recent DEXA scan.  Ordered, patient will schedule.  Reasonable to repeat given her history of prolonged steroid use in the past for her RA. Gastroesophageal reflux disease without esophagitis Stable, refill Protonix Mixed  hyperlipidemia ASCVD risk score 1.8%.  No indication for statin.  Recommend lifestyle modification. Depression, recurrent (HCC) Doing well off of sertraline.  Continue to monitor.    [place] J. Pollie Meyer, MD [facility]

## 2022-10-10 NOTE — Patient Instructions (Signed)
It was great to see you again today.  Refilled protonix Ordered mammogram and bone density test - [facility]  Schedule with Dr. Shawnie Pons for your next pap smear  Be well, Dr. Pollie Meyer

## 2022-10-12 NOTE — Assessment & Plan Note (Signed)
Stable, refill Protonix

## 2022-10-12 NOTE — Assessment & Plan Note (Signed)
ASCVD risk score 1.8%.  No indication for statin.  Recommend lifestyle modification.

## 2022-10-12 NOTE — Assessment & Plan Note (Signed)
No recent DEXA scan.  Ordered, patient will schedule.  Reasonable to repeat given her history of prolonged steroid use in the past for her RA.

## 2022-10-12 NOTE — Assessment & Plan Note (Signed)
Doing well off of sertraline.  Continue to monitor.

## 2022-10-16 NOTE — Telephone Encounter (Addendum)
Spoke with Jenna Gordon. She stated that she called [employer] and they told her there is no further funding for the rest of the year. Due to this the clinic will provide her Xeljenz samples for the remainder of the year. She was advised to call at least 1 day ahead of her intended pick up date for samples.   [name], PharmD, [employer] PGY1

## 2022-10-16 NOTE — Telephone Encounter (Signed)
Noted  

## 2022-10-26 ENCOUNTER — Other Ambulatory Visit: Payer: Self-pay | Admitting: Physician Assistant

## 2022-10-29 ENCOUNTER — Telehealth: Payer: Self-pay | Admitting: *Deleted

## 2022-10-29 NOTE — Telephone Encounter (Signed)
Patient contacted the office requesting a sample of Xeljanz. Patient is update to date on labs. Sample reserved in the cabinet for patient. Patient states she will come to pick it up around lunchtime.

## 2022-10-29 NOTE — Telephone Encounter (Signed)
Last Fill: 07/30/2022  Labs: 09/28/2022 CBC and CMP normal   Next Visit: 02/05/2023  Last Visit: 08/29/2022  DX: Rheumatoid arthritis involving multiple sites with positive rheumatoid factor   Current Dose per office note 08/29/2022: methotrexate 6 tablets by mouth once weekly   Okay to refill Methotrexate?

## 2022-10-29 NOTE — Telephone Encounter (Signed)
Medication Samples have been provided to the patient.  Drug name: Harriette Ohara       Strength: 11 mg        Qty: 1  LOT: [id number]  Exp.Date: 02/2025  Dosing instructions: Take one tablet by mouth daily.

## 2022-10-31 ENCOUNTER — Encounter: Payer: Self-pay | Admitting: Family Medicine

## 2022-11-20 ENCOUNTER — Ambulatory Visit (HOSPITAL_BASED_OUTPATIENT_CLINIC_OR_DEPARTMENT_OTHER)
Admission: RE | Admit: 2022-11-20 | Discharge: 2022-11-20 | Disposition: A | Discharge status: Home / Self Care | Payer: 59 | Source: Ambulatory Visit | Attending: Family Medicine | Admitting: Family Medicine

## 2022-11-20 DIAGNOSIS — Z9189 Other specified personal risk factors, not elsewhere classified: Secondary | ICD-10-CM | POA: Insufficient documentation

## 2022-11-21 ENCOUNTER — Encounter: Payer: Self-pay | Admitting: Family Medicine

## 2022-11-21 ENCOUNTER — Telehealth: Payer: Self-pay | Admitting: *Deleted

## 2022-11-21 DIAGNOSIS — M810 Age-related osteoporosis without current pathological fracture: Secondary | ICD-10-CM | POA: Insufficient documentation

## 2022-11-21 DIAGNOSIS — M818 Other osteoporosis without current pathological fracture: Secondary | ICD-10-CM

## 2022-11-21 NOTE — Telephone Encounter (Signed)
Medication Samples have been provided to the patient.  Drug name: Harriette Ohara       Strength: 11 mg        Qty: 1  LOT: [id number]  Exp.Date: Feb. 2025  Dosing instructions: Take one tablet by mouth daily.

## 2022-11-21 NOTE — Telephone Encounter (Signed)
Patient contacted the office requesting a sample of Xeljanz. Patient is up to date on lab work. Patient advised she may come pick up sample. Sample reserved in cabinet for patient. Patient plans on picking up sample today.

## 2022-11-22 ENCOUNTER — Other Ambulatory Visit: Payer: 59

## 2022-11-22 DIAGNOSIS — M818 Other osteoporosis without current pathological fracture: Secondary | ICD-10-CM

## 2022-11-23 LAB — VITAMIN D 25 HYDROXY (VIT D DEFICIENCY, FRACTURES): Vit D, 25-Hydroxy: 37.7 ng/mL (ref 30.0–100.0)

## 2022-11-23 MED ORDER — ALENDRONATE SODIUM 70 MG PO TABS: 70.0000 mg | ORAL_TABLET | ORAL | 3 refills | Status: DC

## 2022-11-27 ENCOUNTER — Ambulatory Visit
Admission: RE | Admit: 2022-11-27 | Discharge: 2022-11-27 | Disposition: A | Discharge status: Home / Self Care | Payer: 59 | Source: Ambulatory Visit | Attending: Family Medicine | Admitting: Family Medicine

## 2022-11-27 DIAGNOSIS — Z1231 Encounter for screening mammogram for malignant neoplasm of breast: Secondary | ICD-10-CM

## 2022-12-18 ENCOUNTER — Other Ambulatory Visit: Payer: Self-pay | Admitting: *Deleted

## 2022-12-18 ENCOUNTER — Telehealth: Payer: Self-pay | Admitting: *Deleted

## 2022-12-18 DIAGNOSIS — Z79899 Other long term (current) drug therapy: Secondary | ICD-10-CM

## 2022-12-18 NOTE — Telephone Encounter (Signed)
Medication Samples have been provided to the patient.  Drug name: Rinvoq       Strength: 11 mg        Qty: 2   LOT: [id number] Exp.Date: Feb. 2025  Dosing instructions: Take one tablet by mouth daily.

## 2022-12-18 NOTE — Telephone Encounter (Signed)
Patient contacted the office requesting a sample of Xeljanz. Patient states she plans to come today to pick up sample. Patient advised she is due to update labs next week if she soul like to go ahead and get them done today. Sample reserved in cabinet for patient.

## 2022-12-19 ENCOUNTER — Encounter: Payer: 59 | Admitting: Family Medicine

## 2022-12-19 ENCOUNTER — Other Ambulatory Visit: Payer: Self-pay | Admitting: *Deleted

## 2022-12-19 LAB — CBC WITH DIFFERENTIAL/PLATELET
Absolute Lymphocytes: 1881 {cells}/uL (ref 850–3900)
Absolute Monocytes: 519 {cells}/uL (ref 200–950)
Basophils Absolute: 23 {cells}/uL (ref 0–200)
Basophils Relative: 0.4 %
Eosinophils Absolute: 143 {cells}/uL (ref 15–500)
Eosinophils Relative: 2.5 %
HCT: 41.4 % (ref 35.0–45.0)
Hemoglobin: 13.6 g/dL (ref 11.7–15.5)
MCH: 30.8 pg (ref 27.0–33.0)
MCHC: 32.9 g/dL (ref 32.0–36.0)
MCV: 93.9 fL (ref 80.0–100.0)
MPV: 10.8 fL (ref 7.5–12.5)
Monocytes Relative: 9.1 %
Neutro Abs: 3135 {cells}/uL (ref 1500–7800)
Neutrophils Relative %: 55 %
Platelets: 226 10*3/uL (ref 140–400)
RBC: 4.41 10*6/uL (ref 3.80–5.10)
RDW: 13.4 % (ref 11.0–15.0)
Total Lymphocyte: 33 %
WBC: 5.7 10*3/uL (ref 3.8–10.8)

## 2022-12-19 LAB — COMPLETE METABOLIC PANEL WITH GFR
AG Ratio: 1.4 (calc) (ref 1.0–2.5)
ALT: 25 U/L (ref 6–29)
AST: 21 U/L (ref 10–35)
Albumin: 4.1 g/dL (ref 3.6–5.1)
Alkaline phosphatase (APISO): 81 U/L (ref 37–153)
BUN/Creatinine Ratio: 16 (calc) (ref 6–22)
BUN: 17 mg/dL (ref 7–25)
CO2: 28 mmol/L (ref 20–32)
Calcium: 9.2 mg/dL (ref 8.6–10.4)
Chloride: 105 mmol/L (ref 98–110)
Creat: 1.05 mg/dL — ABNORMAL HIGH (ref 0.50–1.03)
Globulin: 3 g/dL (ref 1.9–3.7)
Glucose, Bld: 87 mg/dL (ref 65–99)
Potassium: 4.6 mmol/L (ref 3.5–5.3)
Sodium: 138 mmol/L (ref 135–146)
Total Bilirubin: 0.3 mg/dL (ref 0.2–1.2)
Total Protein: 7.1 g/dL (ref 6.1–8.1)
eGFR: 62 mL/min/{1.73_m2} (ref >=60)

## 2022-12-19 MED ORDER — METHOTREXATE SODIUM 2.5 MG PO TABS: 10.0000 mg | ORAL_TABLET | ORAL | Status: DC

## 2022-12-19 NOTE — Progress Notes (Signed)
CBC and CMP are stable except creatinine is mildly elevated.  Patient should avoid all NSAIDs and increase water intake.  Please advise her to reduce methotrexate to 4 tablets p.o. weekly.

## 2022-12-19 NOTE — Telephone Encounter (Signed)
-----   Message from [facility] sent at 12/19/2022  8:12 AM EST ----- CBC and CMP are stable except creatinine is mildly elevated.  Patient should avoid all NSAIDs and increase water intake.  Please advise her to reduce methotrexate to 4 tablets p.o. weekly.

## 2023-01-01 ENCOUNTER — Encounter: Payer: Self-pay | Admitting: Family Medicine

## 2023-01-22 NOTE — Progress Notes (Signed)
 Office Visit Note  Patient: Jenna Gordon             Date of Birth: 06/26/1965           MRN: [id number]             PCP: Donah Laymon PARAS, MD Referring: Donah Laymon PARAS, MD Visit Date: 02/05/2023 Occupation: @GUAROCC @  Subjective:  Pain in the pubic region   History of Present Illness: Jenna Gordon is a 57 y.o. female with seropositive rheumatoid arthritis and osteoarthritis.  She returns today after her last visit on August 29, 2022.  She continues to be on Xeljanz  11 mg XR daily along with methotrexate  4 tablets p.o. weekly.  The dose of methotrexate  was reduced due to elevation in creatinine.  Patient was also recently diagnosed with osteoporosis and started taking Fosamax  70 mg p.o. weekly.  She states she goes to the gym on a regular basis.  She does elliptical and also runs on the treadmill.  She has been experiencing some pubic pain and would like to have x-rays.  She has not noticed any increased joint swelling.  She states she has noticed flexion when her left fifth finger.  She continues to have dry mouth and dry eye symptoms.  She also has hoarseness in her voice.  She was advised to see a physician at [facility] but she decided against it.    Activities of Daily Living:  Patient reports morning stiffness for 1.5-2 hours.   Patient Denies nocturnal pain.  Difficulty dressing/grooming: Denies Difficulty climbing stairs: Denies Difficulty getting out of chair: Denies Difficulty using hands for taps, buttons, cutlery, and/or writing: Reports  Review of Systems  Constitutional:  Positive for fatigue.  HENT:  Positive for mouth dryness. Negative for mouth sores.   Eyes:  Positive for dryness.  Respiratory:  Negative for shortness of breath.   Cardiovascular:  Negative for chest pain and palpitations.  Gastrointestinal:  Negative for blood in stool, constipation and diarrhea.  Endocrine: Negative for increased urination.  Genitourinary:  Positive for involuntary  urination.  Musculoskeletal:  Positive for joint pain, joint pain, joint swelling and morning stiffness. Negative for gait problem, myalgias, muscle weakness, muscle tenderness and myalgias.  Skin:  Positive for color change and sensitivity to sunlight. Negative for rash and hair loss.  Allergic/Immunologic: Positive for susceptible to infections.  Neurological:  Negative for dizziness and headaches.  Hematological:  Negative for swollen glands.  Psychiatric/Behavioral:  Negative for depressed mood and sleep disturbance. The patient is not nervous/anxious.     PMFS History:  Patient Active Problem List   Diagnosis Date Noted   Osteoporosis 11/21/2022   Nasal congestion 10/18/2021   Nasal septal deviation 10/18/2021   Chronic bronchitis (HCC) 03/06/2021   Hoarseness 03/06/2021   Stress incontinence 12/06/2020   Rib pain on right side 04/13/2020   High risk HPV infection 12/10/2019   Mouth lesion 06/01/2019   Depression, recurrent (HCC) 06/01/2019   Corneal irritation of both eyes 03/04/2019   Fibroid uterus 06/20/2018   Grade II internal hemorrhoids 06/20/2018   Mixed hyperlipidemia 03/14/2016   Environmental allergies 03/14/2016   Gastroesophageal reflux disease without esophagitis 02/24/2015   Rash 07/14/2014   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   Raynaud phenomenon 04/11/2012   ANEMIA, IRON DEFICIENCY 09/08/2009   SJOGREN'S SYNDROME 01/06/2008   Rheumatoid arthritis of multiple sites with negative rheumatoid factor (HCC) 04/09/2007    Past Medical History:  Diagnosis Date   Amnesia memory loss 10/14/2012  Anemia 2009   Arthritis    Dry eyes    GERD (gastroesophageal reflux disease)    History of glaucoma    s/p   bilateral eye laser treatment  2007   History of traumatic head injury 10/14/2012   Lupus (systemic lupus erythematosus) (HCC)    Osteoarthritis    Positive TB test    pt states cxr negative was tested due to treatment for rheumatoid arthritis since test +  , was told  to take medication for tb for 9 months before going back on humira injection   Rheumatoid arthritis(714.0)    Dr. Helon, [facility]   Sjogren's disease [facility])    SUI (stress urinary incontinence, female)    Wears glasses     Family History  Problem Relation Age of Onset   CVA Mother        TIA   Hypertension Mother    Cystic fibrosis Mother    Stomach cancer Father    Diabetes Father    Hypertension Father    Hypertension Brother    Hypertension Brother    Healthy Daughter    Healthy Daughter    Past Surgical History:  Procedure Laterality Date   CARPAL TUNNEL RELEASE Bilateral 2007   CERVICAL CONIZATION W/BX  1994   CESAREAN SECTION  x2  last one 1999   GLAUCOMA SURGERY Bilateral 2007   laser   HYSTEROSCOPY WITH D & C  03-02-2010   MASS EXCISION Left 05/21/2013   Procedure: EXCISION MASS LEFT LONG FINGER;  Surgeon: Prentice LELON Pagan, MD;  Location: [facility];  Service: Orthopedics;  Laterality: Left;   NASAL SEPTOPLASTY W/ TURBINOPLASTY Bilateral 10/18/2021   Procedure: NASAL SEPTOPLASTY WITH TURBINATE REDUCTION AND REPAIR OF NASAL VALVE COLLAPSE;  Surgeon: Llewellyn Gerard LABOR, DO;  Location: [facility] OR;  Service: ENT;  Laterality: Bilateral;   SHOULDER ARTHROSCOPY WITH OPEN ROTATOR CUFF REPAIR Right 2012   TENOSYNOVECTOMY Left 05/21/2013   Procedure: LEFT INDEX FINGER TENOSYNOVECTOMY;  Surgeon: Prentice LELON Pagan, MD;  Location: [facility];  Service: Orthopedics;  Laterality: Left;   TRIGGER FINGER RELEASE  2009   left middle finger   TYMPANOPLASTY Left 1993   Social History   Social History Narrative   Lives with Jenna Gordon 86yo, Daughter, April 21yo, Pets Scout and Carrier.    Immunization History  Administered Date(s) Administered   Influenza Split 10/02/2011   Influenza Whole 11/05/2008, 10/24/2009   Influenza, High Dose Seasonal PF 09/23/2014   Influenza,inj,Quad PF,6+ Mos 10/14/2012, 10/15/2016, 09/24/2017, 10/23/2018,  10/06/2020, 09/28/2021   Influenza-Unspecified 10/02/2011, 10/14/2012, 11/23/2015, 10/15/2016, 09/24/2017, 10/07/2019, 09/23/2022   PFIZER(Purple Top)SARS-COV-2 Vaccination 03/16/2019, 04/06/2019, 09/08/2019, 03/10/2020   PPD Test 02/11/2013   Pfizer Covid-19 Vaccine Bivalent Booster 26yrs & up 12/06/2020, 10/22/2021   Pfizer(Comirnaty)Fall Seasonal Vaccine 12 years and older 09/23/2022   Pneumococcal Conjugate-13 07/27/2016   Pneumococcal Polysaccharide-23 12/06/2020   Td 08/06/2002   Tdap 02/06/2019   Zoster Recombinant(Shingrix) 04/13/2020, 06/13/2020     Objective: Vital Signs: BP 98/67 (BP Location: Left Arm, Patient Position: Sitting, Cuff Size: Normal)   Pulse 65   Resp 14   Ht 5' 3 (1.6 m)   Wt 160 lb (72.6 kg)   LMP 05/05/2014 (Approximate)   BMI 28.34 kg/m    Physical Exam Vitals and nursing note reviewed.  Constitutional:      Appearance: She is well-developed.  HENT:     Head: Normocephalic and atraumatic.  Eyes:     Conjunctiva/sclera: Conjunctivae  normal.  Cardiovascular:     Rate and Rhythm: Normal rate and regular rhythm.     Heart sounds: Normal heart sounds.  Pulmonary:     Effort: Pulmonary effort is normal.     Breath sounds: Normal breath sounds.  Abdominal:     General: Bowel sounds are normal.     Palpations: Abdomen is soft.  Musculoskeletal:     Cervical back: Normal range of motion.  Lymphadenopathy:     Cervical: No cervical adenopathy.  Skin:    General: Skin is warm and dry.     Capillary Refill: Capillary refill takes less than 2 seconds.  Neurological:     Mental Status: She is alert and oriented to person, place, and time.  Psychiatric:        Behavior: Behavior normal.      Musculoskeletal Exam: Cervical, thoracic and lumbar spine 1 good range of motion.  Shoulders, elbows, wrist joints with good range of motion thickening of the right ulnar styloid was noted with some tenderness.  Synovitis was noted over left fifth PIP joint.   Hip joints and knee joints were in good range of motion without any warmth swelling or effusion.  There was no tenderness over ankles or MTPs.  CDAI Exam: CDAI Score: 12  Patient Global: 40 / 100; Provider Global: 40 / 100 Swollen: 2 ; Tender: 2  Joint Exam 02/05/2023      Right  Left  Wrist  Swollen Tender     PIP 5 (finger)     Swollen Tender     Investigation: No additional findings.  Imaging: No results found.  Recent Labs: Lab Results  Component Value Date   WBC 5.7 12/18/2022   HGB 13.6 12/18/2022   PLT 226 12/18/2022   NA 138 12/18/2022   K 4.6 12/18/2022   CL 105 12/18/2022   CO2 28 12/18/2022   GLUCOSE 87 12/18/2022   BUN 17 12/18/2022   CREATININE 1.05 (H) 12/18/2022   BILITOT 0.3 12/18/2022   ALKPHOS 73 04/11/2012   AST 21 12/18/2022   ALT 25 12/18/2022   PROT 7.1 12/18/2022   ALBUMIN 4.1 04/11/2012   CALCIUM 9.2 12/18/2022   GFRAA 115 03/22/2020   QFTBGOLDPLUS NEGATIVE 04/30/2022    Speciality Comments: FYI patient will need Xeljanz  samples for the rest of 2024. She is aware to call ahead of time.   Procedures:  No procedures performed Allergies: Cefzil [cefprozil], Macrobid [nitrofurantoin], and Sulfa antibiotics   Assessment / Plan:     Visit Diagnoses: Rheumatoid arthritis involving multiple sites with positive rheumatoid factor (HCC) - +RF,+anti-CCP, +14-3-3n, Diagnosed in 2006: Patient states she has been having increased discomfort since she has reduced her dose of methotrexate  from 6 tablets to 4 tablets weekly.  The dose of methotrexate  was reduced to due to elevated creatinine in November.  She complains of pain in the.  Symphysis region when she does elliptical and runs.  She has been noticing increased swelling in her left little finger.  She also some discomfort in her right wrist joint.  She has been taking Xeljanz  and methotrexate  on a regular basis.  High risk medication use - Xeljanz  11 mg XR by mouth daily, methotrexate  4 tablets by  mouth once weekly and folic acid  2 mg daily. Lipid panel September 28, 2022 LDL 100, TB Gold negative04/ 082/4.  December 18, 2022 creatinine was elevated at 1.05.  Will recheck BMP today.  She was advised to get labs every 3 months.  Lipid panel will be checked every 6 months.  Information for immunization was placed in the AVS.  She was advised to hold Xeljanz  and methotrexate  if she develops an infection and resume after the infection falls.  FDA blackbox warning associated with Xeljanz  regarding major adverse cardiovascular events, thrombosis, mortality, serious infections and lymphoma was discussed and placed in the AVS.  Elevated serum creatinine - Plan: BASIC METABOLIC PANEL WITH GFR  Pain in both hands -she complained of increased discomfort in the bilateral hands.  I will obtain x-rays today.  Plan: XR Hand 2 View Right, XR Hand 2 View Left.  X-rays were suggestive of rheumatoid arthritis and osteoarthritis overlap without any radiographic progression.  X-rays also discussed with the patient.  Rheumatoid nodulosis (HCC) - resolved  Chondromalacia of both patellae-she has intermittent discomfort.  No warmth swelling or effusion was noted.  Pain in both feet -plan repeat x-rays today.  Plan: XR Foot 2 Views Right, XR Foot 2 Views Left.  X-rays obtained today showed juxta-articular osteopenia.  Osteoarthritic changes were also noted.  No radiographic progression was noted when compared to the x-rays of 2021.  X-rays also discussed with the patient.  Pelvic pain -she has been having increased pain in the pubic symphysis region.  She believes it is related to elliptical use or running.  Plan: XR Pelvis 1-2 Views.  X-rays of the pelvis were unremarkable.  X-ray results were discussed with the patient.  Sicca syndrome (HCC)-she continues to have dry mouth and dry eye symptoms.  Over-the-counter products were discussed.  Raynaud's phenomenon without gangrene-patient states that her symptoms are  manageable with use of gloves.  Age-related osteoporosis without current pathological fracture - November 20, 2022 DEXA scan:The BMD measured at AP Spine L1-L2 is 0.830 g/cm2 with a T-score of-2.8.  Patient started taking Fosamax  70 mg p.o. weekly.  Use of calcium, vitamin D  and resistive exercises were discussed.  Other medical docs listed as follows:  Mixed hyperlipidemia  Chronic laryngitis  Gastroesophageal reflux disease without esophagitis  Mild episode of recurrent major depressive disorder (HCC)  Orders: Orders Placed This Encounter  Procedures   XR Hand 2 View Right   XR Hand 2 View Left   XR Foot 2 Views Right   XR Foot 2 Views Left   XR Pelvis 1-2 Views   BASIC METABOLIC PANEL WITH GFR   No orders of the defined types were placed in this encounter.   Follow-Up Instructions: Return in about 5 months (around 07/06/2023) for Rheumatoid arthritis.   Maya Nash, MD  Note - This record has been created using Animal nutritionist.  Chart creation errors have been sought, but may not always  have been located. Such creation errors do not reflect on  the standard of medical care.

## 2023-01-24 ENCOUNTER — Telehealth: Payer: Self-pay | Admitting: *Deleted

## 2023-01-24 ENCOUNTER — Other Ambulatory Visit: Payer: Self-pay | Admitting: *Deleted

## 2023-01-24 MED ORDER — METHOTREXATE SODIUM 2.5 MG PO TABS: 10.0000 mg | ORAL_TABLET | ORAL | 0 refills | Status: DC

## 2023-01-24 NOTE — Telephone Encounter (Signed)
 Received a message from [place] at [employer]  She advised they need a PA on her Harriette Ohara.   Call back number [phone]

## 2023-01-24 NOTE — Telephone Encounter (Signed)
 Refill request received via fax from [facility] for Methotrexate   Last Fill: 10/29/2022  Labs: 12/18/2022 CBC and CMP are stable except creatinine is mildly elevated.   Next Visit: 02/05/2023  Last Visit: 08/29/2022  DX: Rheumatoid arthritis involving multiple sites with positive rheumatoid factor   Current Dose per lab note 12/18/2022: reduce methotrexate  to 4 tablets p.o. weekly.   Okay to refill Methotrexate ?

## 2023-01-24 NOTE — Telephone Encounter (Signed)
 Submitted an URGENT Prior Authorization request to [facility] for [facility] via CoverMyMeds. Will update once we receive a response.  Key: [id number]

## 2023-01-25 NOTE — Telephone Encounter (Signed)
 Received notification from [facility] regarding a prior authorization for XELJANZ . Authorization has been APPROVED from 01/24/2023 to 01/24/2024. Approval letter sent to scan center.  Patient must continue to fill through [facility]: [phone]   Authorization # [id number]  Sherry Pennant, PharmD, MPH, BCPS, CPP Clinical Pharmacist (Rheumatology and Pulmonology)

## 2023-02-05 ENCOUNTER — Ambulatory Visit: Payer: 59 | Attending: Rheumatology | Admitting: Rheumatology

## 2023-02-05 ENCOUNTER — Ambulatory Visit: Payer: 59

## 2023-02-05 ENCOUNTER — Encounter: Payer: Self-pay | Admitting: Rheumatology

## 2023-02-05 ENCOUNTER — Ambulatory Visit (INDEPENDENT_AMBULATORY_CARE_PROVIDER_SITE_OTHER): Payer: 59

## 2023-02-05 VITALS — BP 98/67 | HR 65 | Resp 14 | Ht 63.0 in | Wt 160.0 lb

## 2023-02-05 DIAGNOSIS — M79671 Pain in right foot: Secondary | ICD-10-CM | POA: Diagnosis not present

## 2023-02-05 DIAGNOSIS — M2241 Chondromalacia patellae, right knee: Secondary | ICD-10-CM

## 2023-02-05 DIAGNOSIS — M2242 Chondromalacia patellae, left knee: Secondary | ICD-10-CM

## 2023-02-05 DIAGNOSIS — M79642 Pain in left hand: Secondary | ICD-10-CM | POA: Diagnosis not present

## 2023-02-05 DIAGNOSIS — M3500 Sicca syndrome, unspecified: Secondary | ICD-10-CM

## 2023-02-05 DIAGNOSIS — R102 Pelvic and perineal pain: Secondary | ICD-10-CM

## 2023-02-05 DIAGNOSIS — M79641 Pain in right hand: Secondary | ICD-10-CM

## 2023-02-05 DIAGNOSIS — M0579 Rheumatoid arthritis with rheumatoid factor of multiple sites without organ or systems involvement: Secondary | ICD-10-CM

## 2023-02-05 DIAGNOSIS — R7989 Other specified abnormal findings of blood chemistry: Secondary | ICD-10-CM | POA: Diagnosis not present

## 2023-02-05 DIAGNOSIS — M8589 Other specified disorders of bone density and structure, multiple sites: Secondary | ICD-10-CM

## 2023-02-05 DIAGNOSIS — E782 Mixed hyperlipidemia: Secondary | ICD-10-CM

## 2023-02-05 DIAGNOSIS — I7300 Raynaud's syndrome without gangrene: Secondary | ICD-10-CM

## 2023-02-05 DIAGNOSIS — F330 Major depressive disorder, recurrent, mild: Secondary | ICD-10-CM

## 2023-02-05 DIAGNOSIS — M0630 Rheumatoid nodule, unspecified site: Secondary | ICD-10-CM

## 2023-02-05 DIAGNOSIS — Z79899 Other long term (current) drug therapy: Secondary | ICD-10-CM | POA: Diagnosis not present

## 2023-02-05 DIAGNOSIS — J370 Chronic laryngitis: Secondary | ICD-10-CM

## 2023-02-05 DIAGNOSIS — M79672 Pain in left foot: Secondary | ICD-10-CM

## 2023-02-05 DIAGNOSIS — K219 Gastro-esophageal reflux disease without esophagitis: Secondary | ICD-10-CM

## 2023-02-05 DIAGNOSIS — M810 Age-related osteoporosis without current pathological fracture: Secondary | ICD-10-CM

## 2023-02-05 NOTE — Patient Instructions (Signed)
 Standing Labs We placed an order today for your standing lab work.   Please have your standing labs drawn in February and every 3 months  Please have your labs drawn 2 weeks prior to your appointment so that the provider can discuss your lab results at your appointment, if possible.  Please note that you may see your imaging and lab results in MyChart before we have reviewed them. We will contact you once all results are reviewed. Please allow our office up to 72 hours to thoroughly review all of the results before contacting the office for clarification of your results.  WALK-IN LAB HOURS  Monday through Thursday from 8:00 am -12:30 pm and 1:00 pm-5:00 pm and Friday from 8:00 am-12:00 pm.  Patients with office visits requiring labs will be seen before walk-in labs.  You may encounter longer than normal wait times. Please allow additional time. Wait times may be shorter on  Monday and Thursday afternoons.  We do not book appointments for walk-in labs. We appreciate your patience and understanding with our staff.   Labs are drawn by [facility]. Please bring your co-pay at the time of your lab draw.  You may receive a bill from [facility] for your lab work.  Please note if you are on Hydroxychloroquine and and an order has been placed for a Hydroxychloroquine level,  you will need to have it drawn 4 hours or more after your last dose.  If you wish to have your labs drawn at another location, please call the office 24 hours in advance so we can fax the orders.  The office is located at [address], [address], [place], [place] [address]   If you have any questions regarding directions or hours of operation,  please call [phone].   As a reminder, please drink plenty of water prior to coming for your lab work. Thanks!   Vaccines You are taking a medication(s) that can suppress your immune system.  The following immunizations are recommended: Flu annually Covid-19  RSV Td/Tdap (tetanus,  diphtheria, pertussis) every 10 years Pneumonia (Prevnar 15 then Pneumovax 23 at least 1 year apart.  Alternatively, can take Prevnar 20 without needing additional dose) Shingrix: 2 doses from 4 weeks to 6 months apart  Please check with your PCP to make sure you are up to date.   If you have signs or symptoms of an infection or start antibiotics: First, call your PCP for workup of your infection. Hold your medication through the infection, until you complete your antibiotics, and until symptoms resolve if you take the following: Injectable medication (Actemra, Benlysta, Cimzia, Cosentyx, Enbrel, Humira, Kevzara, Orencia, Remicade, Simponi, Stelara, Taltz, Tremfya) Methotrexate Leflunomide (Arava) Mycophenolate (Cellcept) Osborne O[place], or Rinvoq   Because you are taking Harriette Ohara, Rinvoq, or Olumiant, it is very important to know that this class of medications has a FDA BLACK BOX WARNING for major adverse cardiovascular events (MACE), thrombosis, mortality (including sudden cardiovascular death), serious infections, and lymphomas. MACE is defined as cardiovascular death, myocardial infarction, and stroke. Thrombosis includes deep venous thrombosis (DVT), pulmonary embolism (PE), and arterial thrombosis. If you are a current or former smoker, you are at higher risk for MACE.

## 2023-02-06 ENCOUNTER — Other Ambulatory Visit: Payer: Self-pay | Admitting: *Deleted

## 2023-02-06 LAB — BASIC METABOLIC PANEL WITH GFR
BUN: 17 mg/dL (ref 7–25)
CO2: 27 mmol/L (ref 20–32)
Calcium: 9 mg/dL (ref 8.6–10.4)
Chloride: 106 mmol/L (ref 98–110)
Creat: 0.96 mg/dL (ref 0.50–1.03)
Glucose, Bld: 95 mg/dL (ref 65–99)
Potassium: 4.3 mmol/L (ref 3.5–5.3)
Sodium: 139 mmol/L (ref 135–146)
eGFR: 69 mL/min/{1.73_m2} (ref >=60)

## 2023-02-06 MED ORDER — METHOTREXATE SODIUM 2.5 MG PO TABS: 12.5000 mg | ORAL_TABLET | ORAL | Status: DC

## 2023-02-06 NOTE — Telephone Encounter (Signed)
-----   Message from [facility] sent at 02/06/2023 11:41 AM EST ----- Yes, patient may increase methotrexate  to 5 tablets p.o. weekly.

## 2023-02-06 NOTE — Progress Notes (Signed)
 BMP is normal.

## 2023-02-06 NOTE — Progress Notes (Signed)
 Yes, patient may increase methotrexate  to 5 tablets p.o. weekly.

## 2023-03-19 ENCOUNTER — Other Ambulatory Visit: Payer: Self-pay | Admitting: Physician Assistant

## 2023-03-19 NOTE — Telephone Encounter (Signed)
 Last Fill: 03/27/2022  Next Visit: 07/31/2023  Last Visit: 02/05/2023  Dx: Rheumatoid arthritis involving multiple sites with positive rheumatoid factor   Current Dose per office note on 02/05/2023: folic acid 2 mg daily   Okay to refill Folic Acid?

## 2023-04-10 ENCOUNTER — Other Ambulatory Visit: Payer: Self-pay | Admitting: Rheumatology

## 2023-04-10 ENCOUNTER — Other Ambulatory Visit: Payer: Self-pay | Admitting: *Deleted

## 2023-04-10 MED ORDER — METHOTREXATE SODIUM 2.5 MG PO TABS: 12.5000 mg | ORAL_TABLET | ORAL | 0 refills | Status: DC

## 2023-04-10 NOTE — Telephone Encounter (Signed)
 Last Fill: 02/06/2023  Labs: 02/05/2023 BMP is normal. I called patient to have labs updated.  12/18/2022 CBC and CMP are stable except creatinine is mildly elevated.  Patient should avoid all NSAIDs and increase water intake.  Please advise her to reduce methotrexate to 4 tablets p.o. weekly.   02/05/2023 methotrexate to 5 tablets p.o. weekly  Next Visit: 07/31/2023  Last Visit: 02/05/2023  DX: Rheumatoid arthritis involving multiple sites with positive rheumatoid factor   Current Dose per office note 02/05/2023: lab results BMP 02/05/2023 methotrexate to 5 tablets p.o. weekly  Okay to refill Methotrexate?

## 2023-04-13 ENCOUNTER — Other Ambulatory Visit: Payer: Self-pay | Admitting: Rheumatology

## 2023-04-13 DIAGNOSIS — E782 Mixed hyperlipidemia: Secondary | ICD-10-CM

## 2023-04-13 DIAGNOSIS — Z111 Encounter for screening for respiratory tuberculosis: Secondary | ICD-10-CM

## 2023-04-13 DIAGNOSIS — Z9225 Personal history of immunosupression therapy: Secondary | ICD-10-CM

## 2023-04-13 DIAGNOSIS — M0579 Rheumatoid arthritis with rheumatoid factor of multiple sites without organ or systems involvement: Secondary | ICD-10-CM

## 2023-04-13 DIAGNOSIS — Z79899 Other long term (current) drug therapy: Secondary | ICD-10-CM

## 2023-04-15 NOTE — Telephone Encounter (Signed)
 Future order placed

## 2023-04-15 NOTE — Addendum Note (Signed)
 Addended by: Henriette Combs on: 04/15/2023 08:46 AM   Modules accepted: Orders

## 2023-04-15 NOTE — Telephone Encounter (Signed)
 Last Fill: 08/29/2022  Labs: 12/18/2022 CBC and CMP are stable except creatinine is mildly elevated   TB Gold: 04/30/2022 Neg    Next Visit: 07/31/2023  Last Visit: 02/05/2023  [id number] arthritis involving multiple sites with positive rheumatoid factor   Current Dose per office note 02/05/2023: Harriette Ohara 11 mg XR by mouth daily   Patient advised she is due to update her lab work.   Okay to refill Harriette Ohara?

## 2023-04-15 NOTE — Telephone Encounter (Signed)
 Please check if patient is due to lipid panel

## 2023-04-16 ENCOUNTER — Other Ambulatory Visit: Payer: Self-pay | Admitting: *Deleted

## 2023-04-16 DIAGNOSIS — Z9225 Personal history of immunosupression therapy: Secondary | ICD-10-CM

## 2023-04-16 DIAGNOSIS — E782 Mixed hyperlipidemia: Secondary | ICD-10-CM

## 2023-04-16 DIAGNOSIS — Z79899 Other long term (current) drug therapy: Secondary | ICD-10-CM

## 2023-04-16 DIAGNOSIS — Z111 Encounter for screening for respiratory tuberculosis: Secondary | ICD-10-CM

## 2023-04-17 NOTE — Progress Notes (Signed)
 WBC count is borderline low-3.6. rest of CBC WNL. We will continue to monitor.  CMP WNL Lipid panel WNL.

## 2023-04-17 NOTE — Progress Notes (Unsigned)
    SUBJECTIVE:   CHIEF COMPLAINT / HPI: cough  Cough Suffering from cough for the past month. Endorses hearing some crackling when she was lying down and taking in deep breaths about 3 days ago. She reports taking a cough suppressant and Mucinex recently. Denies fevers, but has greenish yellow phlegm. Denies sick contacts. Suffers from seasonal allergies which she has been taking allergy medications for daily Cetirizine during the day and Benadryl at night. 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  Has not taken COVID or flu swab due to lack of fevers and previous history of having both these at the same time.  PERTINENT  PMH / PSH: TB, RA, Lupus, Sjogren's disease  OBJECTIVE:   BP (!) 134/96   Pulse 81   Ht 5\' 3"  (1.6 m)   Wt 159 lb 3.2 oz (72.2 kg)   LMP 05/05/2014 (Approximate)   SpO2 100%   BMI 28.20 kg/m   General: Awake and Alert in NAD HEENT: NCAT. Sclera anicteric. No rhinorrhea. Cardiovascular: RRR. No M/R/G Respiratory: CTAB, normal WOB on RA. Mild crackles heard over L lung periphery. No wheezing, crackles, rhonchi, or diminished breath sounds. Abdomen: Soft, non-tender, non-distended. Bowel sounds normoactive Extremities: Able to move all extremities. No BLE edema, no deformities or significant joint findings. Skin: Warm and dry. No abrasions or rashes noted. Neuro: A&Ox3. No focal neurological deficits.  ASSESSMENT/PLAN:   Assessment & Plan Subacute cough Due to persistence of cough over the course of the last month, without significant improvement with cough suppressants and Mucinex, will order CXR with history.  BP likely elevated due to use of cough suppressants. - CXR - Flonase prescribed and instructions provided on how to use - Continue antihistamine daily, encouraged using cetirizine or Benadryl  Fortunato Curling, DO [facility]

## 2023-04-18 ENCOUNTER — Encounter: Payer: Self-pay | Admitting: Family Medicine

## 2023-04-18 ENCOUNTER — Ambulatory Visit: Admitting: Family Medicine

## 2023-04-18 VITALS — BP 134/96 | HR 81 | Ht 63.0 in | Wt 159.2 lb

## 2023-04-18 DIAGNOSIS — R052 Subacute cough: Secondary | ICD-10-CM | POA: Diagnosis not present

## 2023-04-18 LAB — CBC WITH DIFFERENTIAL/PLATELET
Absolute Lymphocytes: 1336 {cells}/uL (ref 850–3900)
Absolute Monocytes: 464 {cells}/uL (ref 200–950)
Basophils Absolute: 0 {cells}/uL (ref 0–200)
Basophils Relative: 0 %
Eosinophils Absolute: 79 {cells}/uL (ref 15–500)
Eosinophils Relative: 2.2 %
HCT: 40.7 % (ref 35.0–45.0)
Hemoglobin: 13.7 g/dL (ref 11.7–15.5)
MCH: 31.1 pg (ref 27.0–33.0)
MCHC: 33.7 g/dL (ref 32.0–36.0)
MCV: 92.5 fL (ref 80.0–100.0)
MPV: 10.5 fL (ref 7.5–12.5)
Monocytes Relative: 12.9 %
Neutro Abs: 1721 {cells}/uL (ref 1500–7800)
Neutrophils Relative %: 47.8 %
Platelets: 204 10*3/uL (ref 140–400)
RBC: 4.4 10*6/uL (ref 3.80–5.10)
RDW: 13.9 % (ref 11.0–15.0)
Total Lymphocyte: 37.1 %
WBC: 3.6 10*3/uL — ABNORMAL LOW (ref 3.8–10.8)

## 2023-04-18 LAB — QUANTIFERON-TB GOLD PLUS
Mitogen-NIL: 3.85 [IU]/mL
NIL: 0.04 [IU]/mL
QuantiFERON-TB Gold Plus: NEGATIVE
TB1-NIL: 0.01 [IU]/mL
TB2-NIL: 0.02 [IU]/mL

## 2023-04-18 LAB — LIPID PANEL
Cholesterol: 168 mg/dL
HDL: 62 mg/dL
LDL Cholesterol (Calc): 83 mg/dL
Non-HDL Cholesterol (Calc): 106 mg/dL
Total CHOL/HDL Ratio: 2.7 (calc)
Triglycerides: 134 mg/dL

## 2023-04-18 LAB — COMPREHENSIVE METABOLIC PANEL WITH GFR
AG Ratio: 1.5 (calc) (ref 1.0–2.5)
ALT: 22 U/L (ref 6–29)
AST: 21 U/L (ref 10–35)
Albumin: 4.4 g/dL (ref 3.6–5.1)
Alkaline phosphatase (APISO): 68 U/L (ref 37–153)
BUN: 13 mg/dL (ref 7–25)
CO2: 26 mmol/L (ref 20–32)
Calcium: 8.7 mg/dL (ref 8.6–10.4)
Chloride: 104 mmol/L (ref 98–110)
Creat: 0.73 mg/dL (ref 0.50–1.03)
Globulin: 2.9 g/dL (ref 1.9–3.7)
Glucose, Bld: 90 mg/dL (ref 65–99)
Potassium: 4.3 mmol/L (ref 3.5–5.3)
Sodium: 137 mmol/L (ref 135–146)
Total Bilirubin: 0.3 mg/dL (ref 0.2–1.2)
Total Protein: 7.3 g/dL (ref 6.1–8.1)
eGFR: 95 mL/min/{1.73_m2} (ref >=60)

## 2023-04-18 MED ORDER — FLUTICASONE PROPIONATE 50 MCG/ACT NA SUSP: 2.0000 | Freq: Every day | NASAL | 6 refills | Status: AC

## 2023-04-18 NOTE — Progress Notes (Signed)
 TB gold negative

## 2023-04-18 NOTE — Patient Instructions (Addendum)
 It was great to see you today! Thank you for choosing [facility] for your primary care. Jenna Gordon was seen for cough.  Today we addressed: Cough - we will get a chest XR to rule out any concerning findings. Prescribed flonase to help with allergies and phlegm.  Take Cetirizine or Benadryl, not both Flonase - point towards your ears and spray into each nostril to avoid pointing towards the septum. If you taste it you waste it, so inhale slowly after using the spray.  You should return to our clinic No follow-ups on file. Please arrive 15 minutes before your appointment to ensure smooth check in process.  We appreciate your efforts in making this happen.  Thank you for allowing me to participate in your care, Fortunato Curling, DO 04/18/2023, 4:59 PM PGY-1, [facility]

## 2023-05-16 ENCOUNTER — Other Ambulatory Visit: Payer: Self-pay | Admitting: Physician Assistant

## 2023-05-16 DIAGNOSIS — M0579 Rheumatoid arthritis with rheumatoid factor of multiple sites without organ or systems involvement: Secondary | ICD-10-CM

## 2023-05-16 NOTE — Telephone Encounter (Signed)
 Last Fill: 04/15/2023 (30 day supply)  Labs: 04/16/2023 WBC count is borderline low-3.6. rest of CBC WNL. CMP WNL Lipid panel WNL.   TB Gold: 04/16/2023 Neg    Next Visit: 07/31/2023  Last Visit: 02/05/2023  [id number] arthritis involving multiple sites with positive rheumatoid factor   Current Dose per office note 02/05/2023: Xeljanz  11 mg XR by mouth daily   Okay to refill Xeljanz ?

## 2023-06-03 ENCOUNTER — Other Ambulatory Visit: Payer: Self-pay | Admitting: Rheumatology

## 2023-06-04 ENCOUNTER — Other Ambulatory Visit: Payer: 59

## 2023-06-27 ENCOUNTER — Other Ambulatory Visit: Payer: Self-pay | Admitting: Rheumatology

## 2023-06-27 NOTE — Telephone Encounter (Signed)
 Last Fill: 04/10/2023  Labs: 04/16/2023 WBC count is borderline low-3.6. rest of CBC WNL. We will continue to monitor. CMP WNL Lipid panel WNL.  Next Visit: 07/31/2023  Last Visit: 02/05/2023  DX: Rheumatoid arthritis involving multiple sites with positive rheumatoid factor (HCC)   Current Dose per office note 02/05/2023: methotrexate  4 tablets by mouth once weekly   Dose changed to 5 tablets weekly per lab note on 02/05/2023.   Okay to refill Methotrexate ?

## 2023-07-16 ENCOUNTER — Ambulatory Visit: Payer: 59 | Admitting: Rheumatology

## 2023-07-19 ENCOUNTER — Ambulatory Visit: Admitting: Family Medicine

## 2023-07-19 ENCOUNTER — Encounter: Payer: Self-pay | Admitting: Family Medicine

## 2023-07-19 VITALS — BP 138/80 | HR 70 | Wt 160.0 lb

## 2023-07-19 DIAGNOSIS — Z23 Encounter for immunization: Secondary | ICD-10-CM

## 2023-07-19 DIAGNOSIS — M0609 Rheumatoid arthritis without rheumatoid factor, multiple sites: Secondary | ICD-10-CM | POA: Diagnosis not present

## 2023-07-19 NOTE — Patient Instructions (Signed)
 Good to see you today - Thank you for coming in  Things we discussed today:  1) It is safe for you to get your MMR vaccine today. It is safe for you to be around your granddaughter too.

## 2023-07-19 NOTE — Progress Notes (Unsigned)
    SUBJECTIVE:   CHIEF COMPLAINT / HPI:   NT is a 58yo F w hx of RA that pf MMR counseling. 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 - She has no record of an MMR vaccine and wonders if she should get it done   OBJECTIVE:   BP 138/80   Pulse 70   Wt 160 lb (72.6 kg)   LMP 05/05/2014 (Approximate)   SpO2 97%   BMI 28.34 kg/m   General: Alert, pleasant woman. NAD. HEENT: NCAT. MMM. CV: RRR, no murmurs.  Resp: CTAB, no wheezing or crackles. Normal WOB on RA.  Abm: Soft, nontender, nondistended. BS present. Ext: Moves all ext spontaneously Skin: Warm, well perfused   ASSESSMENT/PLAN:   Assessment & Plan Rheumatoid arthritis of multiple sites with negative rheumatoid factor (HCC) Given occupation, hx of RA, and recent increased risk of measles exposure, and no documentation of prior MMR, pt should get MMR vaccine.  - MMR vaccine today   Twyla Nearing, MD [facility]

## 2023-07-21 NOTE — Assessment & Plan Note (Addendum)
 Given occupation, hx of RA, and recent increased risk of measles exposure, and no documentation of prior MMR, pt should get MMR vaccine.  - MMR vaccine today

## 2023-07-23 NOTE — Progress Notes (Signed)
 Office Visit Note  Patient: Jenna Gordon             Date of Birth: 08-28-1965           MRN: [id number]             PCP: Jenna Laymon PARAS, MD Referring: Jenna Laymon PARAS, MD Visit Date: 07/31/2023 Occupation: @GUAROCC @  Subjective:  Medication management  History of Present Illness: Jenna Gordon is a 58 y.o. female with seropositive rheumatoid arthritis, osteoarthritis and osteoporosis.  She returns today after her last visit in January 2025.  She states about 10 days ago she developed some soreness in her throat and also irritation.  She was seen at urgent care and was given antibiotics.  She states she stopped Xeljanz  and methotrexate  for about 5 days and then resumed.  While she was off Xeljanz .  Started experiencing some discomfort in her hands.  The symptoms gradually improved.  She continues to be on Xeljanz  11 mg XR daily with methotrexate  4 tablets p.o. weekly and folic acid  1 mg p.o. daily.  She has been taking Fosamax  70 mg p.o. weekly for osteoporosis.  She continues to have some pubic discomfort and also continues to have some hoarseness in her voice.    Activities of Daily Living:  Patient reports morning stiffness for 2 hours.   Patient Denies nocturnal pain.  Difficulty dressing/grooming: Reports Difficulty climbing stairs: Denies Difficulty getting out of chair: Reports Difficulty using hands for taps, buttons, cutlery, and/or writing: Reports  Review of Systems  Constitutional:  Negative for fatigue.  HENT:  Positive for mouth dryness. Negative for mouth sores.   Eyes:  Positive for dryness.  Respiratory:  Negative for shortness of breath.   Cardiovascular:  Negative for chest pain and palpitations.  Gastrointestinal:  Negative for blood in stool, constipation and diarrhea.  Endocrine: Negative for increased urination.  Genitourinary:  Positive for involuntary urination.  Musculoskeletal:  Positive for morning stiffness. Negative for joint pain, gait  problem, joint pain, joint swelling, myalgias, muscle weakness, muscle tenderness and myalgias.  Skin:  Positive for sensitivity to sunlight. Negative for color change, rash and hair loss.  Allergic/Immunologic: Positive for susceptible to infections.  Neurological:  Negative for dizziness and headaches.  Hematological:  Negative for swollen glands.  Psychiatric/Behavioral:  Negative for depressed mood and sleep disturbance. The patient is not nervous/anxious.     PMFS History:  Patient Active Problem List   Diagnosis Date Noted   Osteoporosis 11/21/2022   Nasal congestion 10/18/2021   Nasal septal deviation 10/18/2021   Chronic bronchitis (HCC) 03/06/2021   Hoarseness 03/06/2021   Stress incontinence 12/06/2020   Rib pain on right side 04/13/2020   High risk HPV infection 12/10/2019   Mouth lesion 06/01/2019   Depression, recurrent (HCC) 06/01/2019   Corneal irritation of both eyes 03/04/2019   Fibroid uterus 06/20/2018   Grade II internal hemorrhoids 06/20/2018   Mixed hyperlipidemia 03/14/2016   Environmental allergies 03/14/2016   Gastroesophageal reflux disease without esophagitis 02/24/2015   Rash 07/14/2014   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   Raynaud phenomenon 04/11/2012   ANEMIA, IRON DEFICIENCY 09/08/2009   SJOGREN'S SYNDROME 01/06/2008   Rheumatoid arthritis of multiple sites with negative rheumatoid factor (HCC) 04/09/2007    Past Medical History:  Diagnosis Date   Amnesia memory loss 10/14/2012   Anemia 2009   Arthritis    Dry eyes    GERD (gastroesophageal reflux disease)    History of glaucoma    s/p  bilateral eye laser treatment  2007   History of traumatic head injury 10/14/2012   Lupus (systemic lupus erythematosus) (HCC)    Osteoarthritis    Positive TB test    pt states cxr negative was tested due to treatment for rheumatoid arthritis since test +  , was told to take medication for tb for 9 months before going back on humira injection   Rheumatoid  arthritis(714.0)    Dr. Helon, [facility]   Sjogren's disease [facility])    SUI (stress urinary incontinence, female)    Wears glasses     Family History  Problem Relation Age of Onset   CVA Mother        TIA   Hypertension Mother    Cystic fibrosis Mother    Stomach cancer Father    Diabetes Father    Hypertension Father    Hypertension Brother    Hypertension Brother    Healthy Daughter    Healthy Daughter    Past Surgical History:  Procedure Laterality Date   CARPAL TUNNEL RELEASE Bilateral 2007   CERVICAL CONIZATION W/BX  1994   CESAREAN SECTION  x2  last one 1999   GLAUCOMA SURGERY Bilateral 2007   laser   HYSTEROSCOPY WITH D & C  03-02-2010   MASS EXCISION Left 05/21/2013   Procedure: EXCISION MASS LEFT LONG FINGER;  Surgeon: Jenna LELON Pagan, MD;  Location: [facility];  Service: Orthopedics;  Laterality: Left;   NASAL SEPTOPLASTY W/ TURBINOPLASTY Bilateral 10/18/2021   Procedure: NASAL SEPTOPLASTY WITH TURBINATE REDUCTION AND REPAIR OF NASAL VALVE COLLAPSE;  Surgeon: Jenna Gerard LABOR, DO;  Location: [facility] OR;  Service: ENT;  Laterality: Bilateral;   SHOULDER ARTHROSCOPY WITH OPEN ROTATOR CUFF REPAIR Right 2012   TENOSYNOVECTOMY Left 05/21/2013   Procedure: LEFT INDEX FINGER TENOSYNOVECTOMY;  Surgeon: Jenna LELON Pagan, MD;  Location: [facility];  Service: Orthopedics;  Laterality: Left;   TRIGGER FINGER RELEASE  2009   left middle finger   TYMPANOPLASTY Left 1993   Social History   Social History Narrative   Lives with Jenna Gordon 87yo, Daughter, Jenna Gordon 21yo, Pets [employer] and [employer].    Immunization History  Administered Date(s) Administered   Influenza Split 10/02/2011   Influenza Whole 11/05/2008, 10/24/2009   Influenza, High Dose Seasonal PF 09/23/2014   Influenza,inj,Quad PF,6+ Mos 10/14/2012, 10/15/2016, 09/24/2017, 10/23/2018, 10/06/2020, 09/28/2021   Influenza-Unspecified 10/02/2011, 10/14/2012, 11/23/2015, 10/15/2016,  09/24/2017, 10/07/2019, 09/23/2022   MMR 07/19/2023   PFIZER(Purple Top)SARS-COV-2 Vaccination 03/16/2019, 04/06/2019, 09/08/2019, 03/10/2020   PPD Test 02/11/2013   Pfizer Covid-19 Vaccine Bivalent Booster 51yrs & up 12/06/2020, 10/22/2021   Pfizer(Comirnaty)Fall Seasonal Vaccine 12 years and older 09/23/2022   Pneumococcal Conjugate-13 07/27/2016   Pneumococcal Polysaccharide-23 12/06/2020   Td 08/06/2002   Tdap 02/06/2019   Zoster Recombinant(Shingrix) 04/13/2020, 06/13/2020     Objective: Vital Signs: BP 116/73 (BP Location: Left Arm, Patient Position: Sitting, Cuff Size: Normal)   Pulse 66   Resp 16   Ht 5' 3 (1.6 m)   Wt 159 lb 3.2 oz (72.2 kg)   LMP 05/05/2014 (Approximate)   BMI 28.20 kg/m    Physical Exam Vitals and nursing note reviewed.  Constitutional:      Appearance: She is well-developed.  HENT:     Head: Normocephalic and atraumatic.  Eyes:     Conjunctiva/sclera: Conjunctivae normal.  Cardiovascular:     Rate and Rhythm: Normal rate and regular rhythm.     Heart sounds: Normal heart  sounds.  Pulmonary:     Effort: Pulmonary effort is normal.     Breath sounds: Normal breath sounds.  Abdominal:     General: Bowel sounds are normal.     Palpations: Abdomen is soft.  Musculoskeletal:     Cervical back: Normal range of motion.  Lymphadenopathy:     Cervical: No cervical adenopathy.  Skin:    General: Skin is warm and dry.     Capillary Refill: Capillary refill takes less than 2 seconds.  Neurological:     Mental Status: She is alert and oriented to person, place, and time.  Psychiatric:        Behavior: Behavior normal.      Musculoskeletal Exam: Cervical, thoracic and lumbar spine were in good range of motion.  Shoulders, elbows were in good range of motion.  She has thickening of bilateral wrist joints without any synovitis.  Synovial thickening was noted over bilateral MCP joints with ulnar deviation.  No synovitis was noted.  PIP and DIP  thickening was noted.  Hip joints and knee joints were in good range of motion without any warmth swelling or effusion.  There was no tenderness over ankles or MTPs.  CDAI Exam: CDAI Score: -- Patient Global: 30 / 100; Provider Global: 30 / 100 Swollen: --; Tender: -- Joint Exam 07/31/2023   No joint exam has been documented for this visit   There is currently no information documented on the homunculus. Go to the Rheumatology activity and complete the homunculus joint exam.  Investigation: No additional findings.  Imaging: No results found.  Recent Labs: Lab Results  Component Value Date   WBC 3.6 (L) 04/16/2023   HGB 13.7 04/16/2023   PLT 204 04/16/2023   NA 137 04/16/2023   K 4.3 04/16/2023   CL 104 04/16/2023   CO2 26 04/16/2023   GLUCOSE 90 04/16/2023   BUN 13 04/16/2023   CREATININE 0.73 04/16/2023   BILITOT 0.3 04/16/2023   ALKPHOS 73 04/11/2012   AST 21 04/16/2023   ALT 22 04/16/2023   PROT 7.3 04/16/2023   ALBUMIN 4.1 04/11/2012   CALCIUM 8.7 04/16/2023   GFRAA 115 03/22/2020   QFTBGOLDPLUS NEGATIVE 04/16/2023    Speciality Comments: FYI patient will need Xeljanz  samples for the rest of 2024. She is aware to call ahead of time. Fosamax  10/24   Procedures:  No procedures performed Allergies: Cefzil [cefprozil], Macrobid [nitrofurantoin], and Sulfa antibiotics   Assessment / Plan:     Visit Diagnoses: Rheumatoid arthritis involving multiple sites with positive rheumatoid factor (HCC) - +RF,+anti-CCP, +14-3-3n, Diagnosed in 2006: Patient states she had a recent flare as she came off Xeljanz  and methotrexate  for about 5 days due to an upper respiratory tract infection.  The symptoms soon resolved after resuming the medications.  She denies any joint pain or joint swelling today.  She continues to have some stiffness in the morning.  Prescription refill for Xeljanz  and methotrexate  were sent today.  High risk medication use - Xeljanz  11 mg XR by mouth daily,  methotrexate  4 tablets by mouth once weekly and folic acid  2 mg daily. -April 16, 2023 CBC showed WBC count 3.6.  CMP was normal.  TB Gold was negative.  Labs were reviewed with the patient.  She was advised to get labs every 3 months.  Plan: CBC with Differential/Platelet, Comprehensive metabolic panel with GFR.  TB Gold checked annually.  Information reimmunization was placed in the AVS.  She was advised to hold Xeljanz   and methotrexate  if she develops an infection and resume after the infection resolves.  Annual skin examination to screen for skin cancer was advised.  Use of sunscreen and sun protection was discussed.  Increased risk of adverse cardiovascular events including the risk of DVTs, MI, stroke and pulmonary embolism were discussed.  Increase water intake was discussed.  Mixed hyperlipidemia-April 16, 2023 lipid panel was normal.  Pain in both hands-she has osteoarthritis and rheumatoid arthritis overlap with synovial thickening and ulnar deviation.  Rheumatoid nodulosis (HCC)-proved.  Chondromalacia of both patellae-she currently not having any discomfort in her knees.  Pain in both feet -she has stiffness in her feet but no active synovitis was noted.  Juxta-articular osteopenia.  Osteoarthritic changes were also noted.  No radiographic progression was noted when compared to the x-rays of 2021  Pelvic pain -she experiences some pubic discomfort while she is on elliptical.  X-rays obtained in the past of the pelvis were unremarkable.  Sicca syndrome (HCC)-he continues to have some sicca symptoms.  Over-the-counter products were discussed.  Raynaud's phenomenon without gangrene-symptomatic during the winter months.  Keeping core temperature warm and warm clothing was discussed.  Age-related osteoporosis without current pathological fracture - - November 20, 2022 DEXA scan:The BMD measured at [facility] Spine L1-L2 is 0.830 g/cm2 with a T-score of-2.8.  Patient started taking Fosamax  70 mg p.o.  weekly by her PCP.  Chronic laryngitis-patient had extensive workup by the ENT in the past.  Gastroesophageal reflux disease without esophagitis  Mild episode of recurrent major depressive disorder (HCC)  Orders: Orders Placed This Encounter  Procedures   CBC with Differential/Platelet   Comprehensive metabolic panel with GFR   Meds ordered this encounter  Medications   methotrexate  (RHEUMATREX) 2.5 MG tablet    Sig: Take 5 tablets (12.5 mg total) by mouth once a week. Caution:Chemotherapy. Protect from light.    Dispense:  60 tablet    Refill:  0   Tofacitinib  Citrate ER (XELJANZ  XR) 11 MG TB24    Sig: Take 1 tablet (11 mg total) by mouth daily.    Dispense:  90 tablet    Refill:  0     Follow-Up Instructions: Return in about 5 months (around 12/31/2023) for Rheumatoid arthritis.   Maya Nash, MD  Note - This record has been created using Animal nutritionist.  Chart creation errors have been sought, but may not always  have been located. Such creation errors do not reflect on  the standard of medical care.

## 2023-07-31 ENCOUNTER — Ambulatory Visit: Payer: 59 | Attending: Rheumatology | Admitting: Rheumatology

## 2023-07-31 ENCOUNTER — Encounter: Payer: Self-pay | Admitting: Rheumatology

## 2023-07-31 VITALS — BP 116/73 | HR 66 | Resp 16 | Ht 63.0 in | Wt 159.2 lb

## 2023-07-31 DIAGNOSIS — M2241 Chondromalacia patellae, right knee: Secondary | ICD-10-CM

## 2023-07-31 DIAGNOSIS — R102 Pelvic and perineal pain: Secondary | ICD-10-CM

## 2023-07-31 DIAGNOSIS — M810 Age-related osteoporosis without current pathological fracture: Secondary | ICD-10-CM

## 2023-07-31 DIAGNOSIS — Z79899 Other long term (current) drug therapy: Secondary | ICD-10-CM

## 2023-07-31 DIAGNOSIS — M79672 Pain in left foot: Secondary | ICD-10-CM

## 2023-07-31 DIAGNOSIS — M2242 Chondromalacia patellae, left knee: Secondary | ICD-10-CM

## 2023-07-31 DIAGNOSIS — M79641 Pain in right hand: Secondary | ICD-10-CM

## 2023-07-31 DIAGNOSIS — K219 Gastro-esophageal reflux disease without esophagitis: Secondary | ICD-10-CM

## 2023-07-31 DIAGNOSIS — J370 Chronic laryngitis: Secondary | ICD-10-CM

## 2023-07-31 DIAGNOSIS — M0579 Rheumatoid arthritis with rheumatoid factor of multiple sites without organ or systems involvement: Secondary | ICD-10-CM

## 2023-07-31 DIAGNOSIS — E782 Mixed hyperlipidemia: Secondary | ICD-10-CM

## 2023-07-31 DIAGNOSIS — M3500 Sicca syndrome, unspecified: Secondary | ICD-10-CM

## 2023-07-31 DIAGNOSIS — M0630 Rheumatoid nodule, unspecified site: Secondary | ICD-10-CM

## 2023-07-31 DIAGNOSIS — F330 Major depressive disorder, recurrent, mild: Secondary | ICD-10-CM

## 2023-07-31 DIAGNOSIS — R7989 Other specified abnormal findings of blood chemistry: Secondary | ICD-10-CM

## 2023-07-31 DIAGNOSIS — M79642 Pain in left hand: Secondary | ICD-10-CM

## 2023-07-31 DIAGNOSIS — M79671 Pain in right foot: Secondary | ICD-10-CM

## 2023-07-31 DIAGNOSIS — I7300 Raynaud's syndrome without gangrene: Secondary | ICD-10-CM

## 2023-07-31 LAB — CBC WITH DIFFERENTIAL/PLATELET
Absolute Lymphocytes: 2052 {cells}/uL (ref 850–3900)
Absolute Monocytes: 495 {cells}/uL (ref 200–950)
Basophils Absolute: 28 {cells}/uL (ref 0–200)
Basophils Relative: 0.5 %
Eosinophils Absolute: 187 {cells}/uL (ref 15–500)
Eosinophils Relative: 3.4 %
HCT: 41.4 % (ref 35.0–45.0)
Hemoglobin: 14 g/dL (ref 11.7–15.5)
MCH: 32.3 pg (ref 27.0–33.0)
MCHC: 33.8 g/dL (ref 32.0–36.0)
MCV: 95.6 fL (ref 80.0–100.0)
MPV: 10.4 fL (ref 7.5–12.5)
Monocytes Relative: 9 %
Neutro Abs: 2739 {cells}/uL (ref 1500–7800)
Neutrophils Relative %: 49.8 %
Platelets: 217 Thousand/uL (ref 140–400)
RBC: 4.33 Million/uL (ref 3.80–5.10)
RDW: 14 % (ref 11.0–15.0)
Total Lymphocyte: 37.3 %
WBC: 5.5 Thousand/uL (ref 3.8–10.8)

## 2023-07-31 LAB — COMPREHENSIVE METABOLIC PANEL WITH GFR
AG Ratio: 1.5 (calc) (ref 1.0–2.5)
ALT: 24 U/L (ref 6–29)
AST: 24 U/L (ref 10–35)
Albumin: 4.5 g/dL (ref 3.6–5.1)
Alkaline phosphatase (APISO): 56 U/L (ref 37–153)
BUN: 14 mg/dL (ref 7–25)
CO2: 26 mmol/L (ref 20–32)
Calcium: 9.7 mg/dL (ref 8.6–10.4)
Chloride: 104 mmol/L (ref 98–110)
Creat: 0.87 mg/dL (ref 0.50–1.03)
Globulin: 3.1 g/dL (ref 1.9–3.7)
Glucose, Bld: 89 mg/dL (ref 65–99)
Potassium: 4.3 mmol/L (ref 3.5–5.3)
Sodium: 138 mmol/L (ref 135–146)
Total Bilirubin: 0.4 mg/dL (ref 0.2–1.2)
Total Protein: 7.6 g/dL (ref 6.1–8.1)
eGFR: 77 mL/min/1.73m2 (ref >=60)

## 2023-07-31 MED ORDER — METHOTREXATE SODIUM 2.5 MG PO TABS: 12.5000 mg | ORAL_TABLET | ORAL | 0 refills | Status: DC

## 2023-07-31 MED ORDER — XELJANZ XR 11 MG PO TB24: 1.0000 | ORAL_TABLET | Freq: Every day | ORAL | 0 refills | Status: DC

## 2023-07-31 NOTE — Patient Instructions (Addendum)
 Standing Labs We placed an order today for your standing lab work.   Please have your standing labs drawn in October and every 3 months  Please have your labs drawn 2 weeks prior to your appointment so that the provider can discuss your lab results at your appointment, if possible.  Please note that you may see your imaging and lab results in MyChart before we have reviewed them. We will contact you once all results are reviewed. Please allow our office up to 72 hours to thoroughly review all of the results before contacting the office for clarification of your results.  WALK-IN LAB HOURS  Monday through Thursday from 8:00 am -12:30 pm and 1:00 pm-4:30 pm and Friday from 8:00 am-12:00 pm.  Patients with office visits requiring labs will be seen before walk-in labs.  You may encounter longer than normal wait times. Please allow additional time. Wait times may be shorter on  Monday and Thursday afternoons.  We do not book appointments for walk-in labs. We appreciate your patience and understanding with our staff.   Labs are drawn by [facility]. Please bring your co-pay at the time of your lab draw.  You may receive a bill from [facility] for your lab work.  Please note if you are on Hydroxychloroquine and and an order has been placed for a Hydroxychloroquine level,  you will need to have it drawn 4 hours or more after your last dose.  If you wish to have your labs drawn at another location, please call the office 24 hours in advance so we can fax the orders.  The office is located at [address], [place], [place] [address]   If you have any questions regarding directions or hours of operation,  please call [phone].   As a reminder, please drink plenty of water prior to coming for your lab work. Thanks!   Vaccines You are taking a medication(s) that can suppress your immune system.  The following immunizations are recommended: Flu annually Covid-19  Td/Tdap (tetanus,  diphtheria, pertussis) every 10 years Pneumonia (Prevnar 15 then Pneumovax 23 at least 1 year apart.  Alternatively, can take Prevnar 20 without needing additional dose) Shingrix: 2 doses from 4 weeks to 6 months apart  Please check with your PCP to make sure you are up to date.   If you have signs or symptoms of an infection or start antibiotics: First, call your PCP for workup of your infection. Hold your medication through the infection, until you complete your antibiotics, and until symptoms resolve if you take the following: Injectable medication (Actemra, Benlysta, Cimzia, Cosentyx, Enbrel, Humira, Kevzara, Orencia, Remicade, Simponi, Stelara, Taltz, Tremfya) Methotrexate  Leflunomide (Arava) Mycophenolate (Cellcept) Xeljanz , Olumiant, or Rinvoq   Because you are taking Xeljanz , Rinvoq, or Olumiant, it is very important to know that this class of medications has a FDA BLACK BOX WARNING for major adverse cardiovascular events (MACE), thrombosis, mortality (including sudden cardiovascular death), serious infections, and lymphomas. MACE is defined as cardiovascular death, myocardial infarction, and stroke. Thrombosis includes deep venous thrombosis (DVT), pulmonary embolism (PE), and arterial thrombosis. If you are a current or former smoker, you are at higher risk for MACE.   Please get an annual skin examination to screen for skin cancer while you are on Xeljanz .  Please use sunscreen and sun protection.

## 2023-08-01 ENCOUNTER — Ambulatory Visit: Payer: Self-pay | Admitting: Rheumatology

## 2023-08-01 NOTE — Progress Notes (Signed)
 CBC and CMP normal

## 2023-08-09 ENCOUNTER — Encounter: Payer: Self-pay | Admitting: Advanced Practice Midwife

## 2023-08-15 ENCOUNTER — Other Ambulatory Visit: Payer: Self-pay | Admitting: Family Medicine

## 2023-10-23 ENCOUNTER — Other Ambulatory Visit: Payer: Self-pay | Admitting: Family Medicine

## 2023-11-04 ENCOUNTER — Ambulatory Visit: Admitting: Family Medicine

## 2023-11-04 ENCOUNTER — Ambulatory Visit
Admission: RE | Admit: 2023-11-04 | Discharge: 2023-11-04 | Disposition: A | Discharge status: Home / Self Care | Source: Ambulatory Visit

## 2023-11-04 ENCOUNTER — Ambulatory Visit: Payer: Self-pay | Admitting: Family Medicine

## 2023-11-04 VITALS — BP 122/80 | HR 74 | Ht 63.0 in | Wt 164.8 lb

## 2023-11-04 DIAGNOSIS — M25532 Pain in left wrist: Secondary | ICD-10-CM

## 2023-11-04 NOTE — Patient Instructions (Addendum)
 VISIT SUMMARY: You visited us  today due to wrist pain following a fall. We suspect a scaphoid fracture in your left wrist and have ordered an X-ray to confirm this.  YOUR PLAN: SUSPECTED SCAPHOID FRACTURE OF LEFT WRIST: You have an acute injury to your left wrist with swelling, bruising, and tenderness, which suggests a scaphoid fracture. Your osteoporosis increases the risk of fractures. -We have ordered a wrist X-ray at [facility] to check for a scaphoid fracture. -Continue using the brace to support your thumb and wrist. -You can continue taking Tylenol  and ibuprofen for pain relief.  Please let me know if you have any other questions.  Dr. Tharon

## 2023-11-04 NOTE — Progress Notes (Signed)
   SUBJECTIVE:   CHIEF COMPLAINT / HPI:  Discussed the use of AI scribe software for clinical note transcription with the patient, who gave verbal consent to proceed.  History of Present Illness Jenna Gordon is a 58 year old female with rheumatoid arthritis and osteoporosis who presents with wrist pain following a fall.  On Saturday evening, she fell after losing her balance while turning off a smoke detector alarm, extending her hand to break the fall. This resulted in immediate, severe wrist pain, swelling, and bruising, which worsened by the next morning. She manages the pain with Tylenol  and ibuprofen, experiencing some relief. Initially, she had difficulty moving her fingers, but now can move them with discomfort. The thumb remains tender and painful, especially when gripping objects. She uses a brace to immobilize her wrist, describing a 'shocking' sensation when it is not immobilized, and has difficulty holding or gripping objects.   PERTINENT  PMH / PSH: OA of both hands, RA with negative rheumatoid factor/synovial thickening/ulnar deviation on recent hand XR 01/2023, osteoporosis, Raynaud's syndrome  OBJECTIVE:  BP 122/80   Pulse 74   Ht 5' 3 (1.6 m)   Wt 164 lb 12.8 oz (74.8 kg)   LMP 05/05/2014 (Approximate)   SpO2 90%   BMI 29.19 kg/m   Physical Exam GENERAL: Alert, cooperative, well developed, no acute distress. EXTREMITIES: Radial pulse intact in R upper extremity. Mild-moderate swelling, bruising, and tenderness noted along the medial wrist. She is especially tender over anatomic snuff box. Pain on gripping in hand. No cyanosis or edema. NEUROLOGICAL: Cranial nerves grossly intact. Moves all extremities without otherwise gross motor or sensory deficit.  ASSESSMENT/PLAN:  Assessment and Plan Assessment & Plan Suspected scaphoid fracture of left wrist   Acute left wrist injury with swelling, bruising, and tenderness in the anatomical snuffbox suggests a scaphoid  fracture. Osteoporosis increases fracture risk. Order wrist X-ray at [facility] to evaluate for scaphoid fracture. Advise continued use of thumb stabilizing brace (no true thumb spica in clinic) for now. Will also place urgent referral to sports medicine; advised she can go to [facility] today and ask for the correct splint also later today.  Stuart Redo, MD [facility]

## 2023-11-05 ENCOUNTER — Ambulatory Visit
Admission: RE | Admit: 2023-11-05 | Discharge: 2023-11-05 | Disposition: A | Discharge status: Home / Self Care | Source: Ambulatory Visit | Attending: Orthopedic Surgery | Admitting: Orthopedic Surgery

## 2023-11-05 ENCOUNTER — Other Ambulatory Visit: Payer: Self-pay | Admitting: Orthopedic Surgery

## 2023-11-05 DIAGNOSIS — M25532 Pain in left wrist: Secondary | ICD-10-CM

## 2023-11-15 ENCOUNTER — Other Ambulatory Visit: Payer: Self-pay | Admitting: Rheumatology

## 2023-11-15 DIAGNOSIS — M0579 Rheumatoid arthritis with rheumatoid factor of multiple sites without organ or systems involvement: Secondary | ICD-10-CM

## 2023-11-15 NOTE — Telephone Encounter (Signed)
 Last Fill: 07/31/2023  Labs: 07/31/2023 CBC and CMP normal   TB Gold: 04/16/2023 negative    Next Visit: 01/02/2024  Last Visit: 07/31/2023  IK:Myzlfjunpi arthritis involving multiple sites with positive rheumatoid factor   Current Dose per office note on 07/31/2023: Xeljanz  11 mg XR by mouth daily.   Advised patient she is due to update labs, patient verbalized understanding and will come by the office today to update labs. Standing lab orders are in place.   Okay to refill Xeljanz ?

## 2023-11-19 ENCOUNTER — Other Ambulatory Visit: Payer: Self-pay | Admitting: *Deleted

## 2023-11-19 ENCOUNTER — Encounter: Payer: Self-pay | Admitting: *Deleted

## 2023-11-19 DIAGNOSIS — Z79899 Other long term (current) drug therapy: Secondary | ICD-10-CM

## 2023-11-20 ENCOUNTER — Ambulatory Visit: Payer: Self-pay | Admitting: Physician Assistant

## 2023-11-20 LAB — COMPLETE METABOLIC PANEL WITHOUT GFR
AG Ratio: 1.5 (calc) (ref 1.0–2.5)
ALT: 55 U/L — ABNORMAL HIGH (ref 6–29)
AST: 43 U/L — ABNORMAL HIGH (ref 10–35)
Albumin: 4.8 g/dL (ref 3.6–5.1)
Alkaline phosphatase (APISO): 63 U/L (ref 37–153)
BUN: 14 mg/dL (ref 7–25)
CO2: 28 mmol/L (ref 20–32)
Calcium: 9.8 mg/dL (ref 8.6–10.4)
Chloride: 104 mmol/L (ref 98–110)
Creat: 0.76 mg/dL (ref 0.50–1.03)
Globulin: 3.1 g/dL (ref 1.9–3.7)
Glucose, Bld: 95 mg/dL (ref 65–99)
Potassium: 4.2 mmol/L (ref 3.5–5.3)
Sodium: 140 mmol/L (ref 135–146)
Total Bilirubin: 0.5 mg/dL (ref 0.2–1.2)
Total Protein: 7.9 g/dL (ref 6.1–8.1)

## 2023-11-20 LAB — CBC WITH DIFFERENTIAL/PLATELET
Absolute Lymphocytes: 1674 {cells}/uL (ref 850–3900)
Absolute Monocytes: 362 {cells}/uL (ref 200–950)
Basophils Absolute: 22 {cells}/uL (ref 0–200)
Basophils Relative: 0.4 %
Eosinophils Absolute: 130 {cells}/uL (ref 15–500)
Eosinophils Relative: 2.4 %
HCT: 43.7 % (ref 35.0–45.0)
Hemoglobin: 14.7 g/dL (ref 11.7–15.5)
MCH: 32.4 pg (ref 27.0–33.0)
MCHC: 33.6 g/dL (ref 32.0–36.0)
MCV: 96.3 fL (ref 80.0–100.0)
MPV: 10.6 fL (ref 7.5–12.5)
Monocytes Relative: 6.7 %
Neutro Abs: 3213 {cells}/uL (ref 1500–7800)
Neutrophils Relative %: 59.5 %
Platelets: 225 Thousand/uL (ref 140–400)
RBC: 4.54 Million/uL (ref 3.80–5.10)
RDW: 13.4 % (ref 11.0–15.0)
Total Lymphocyte: 31 %
WBC: 5.4 Thousand/uL (ref 3.8–10.8)

## 2023-11-20 NOTE — Progress Notes (Signed)
 CBC WNL AST and ALT are elevated-please clarify the dose of methotrexate  she is taking.  Last note with Dr. Dolphus says she was taking 4 tablets weekly but the refill at that visit was sent for 5 tablets weekly.   Please clarify if she is taking any tylenol  or alcohol use? Rest of CMP WNL.

## 2023-12-14 ENCOUNTER — Other Ambulatory Visit: Payer: Self-pay | Admitting: Rheumatology

## 2023-12-14 DIAGNOSIS — M0579 Rheumatoid arthritis with rheumatoid factor of multiple sites without organ or systems involvement: Secondary | ICD-10-CM

## 2023-12-14 DIAGNOSIS — Z79899 Other long term (current) drug therapy: Secondary | ICD-10-CM

## 2023-12-15 ENCOUNTER — Other Ambulatory Visit: Payer: Self-pay | Admitting: Rheumatology

## 2023-12-15 DIAGNOSIS — M0579 Rheumatoid arthritis with rheumatoid factor of multiple sites without organ or systems involvement: Secondary | ICD-10-CM

## 2023-12-16 NOTE — Telephone Encounter (Signed)
 Last Fill: 11/15/2023  Labs: 11/19/2023 CBC WNL AST and ALT are elevated   Rest of CMP WNL.   TB Gold: 04/16/2023   Next Visit: 01/02/2024  Last Visit: 07/31/2023  IK:Myzlfjunpi arthritis involving multiple sites with positive rheumatoid factor   Current Dose per office note 07/31/2023: Xeljanz  11 mg XR by mouth daily   Okay to refill Xeljanz ?

## 2023-12-16 NOTE — Telephone Encounter (Signed)
 Advised patient that  LFTs were elevated.  Patient should reduce methotrexate  to 4 tablets weekly.  We should repeat LFTs in 2 weeks after reducing the dose of methotrexate .      Patient verbalized understanding.    Please review and sign pended rx and future lab orders. Thanks!

## 2023-12-16 NOTE — Telephone Encounter (Signed)
 LFTs were elevated.  Patient should reduce methotrexate  to 4 tablets weekly.  We should repeat LFTs in 2 weeks after reducing the dose of methotrexate .

## 2023-12-16 NOTE — Telephone Encounter (Signed)
 Last Fill: 07/31/2023  Labs: 11/19/2023 CBC WNL AST and ALT are elevated-please clarify the dose of methotrexate  she is taking.  Last note with Dr. Dolphus says she was taking 4 tablets weekly but the refill at that visit was sent for 5 tablets weekly.   Please clarify if she is taking any tylenol  or alcohol use? Rest of CMP WNL.    Patient states she is taking MTX 5 tabs weekly.  Last note with Dr. Dolphus says she was taking 4 tablets weekly but the refill at that visit was sent for 5 tablets weekly.   Patient states she drinks alcohol every once and a while. Patient states she has been taking Tylenol  due to a fracture and states she has been taking sinus and allergy medicine.   Next Visit: 01/02/2024  Last Visit: 07/31/2023  DX: Rheumatoid arthritis involving multiple sites with positive rheumatoid factor   Current Dose per office note on 07/31/2023: methotrexate  4 tablets by mouth once weekly   Okay to refill Methotrexate  and at what dose?

## 2023-12-23 NOTE — Progress Notes (Signed)
 Office Visit Note  Patient: Jenna Gordon             Date of Birth: April 07, 1965           MRN: [id number]             PCP: Donah Laymon PARAS, MD Referring: Donah Laymon PARAS, MD Visit Date: 01/02/2024 Occupation: INTERPRETER  Subjective:  Right knee pain  History of Present Illness: Jenna Gordon is a 58 y.o. female with seropositive rheumatoid arthritis, osteoarthritis and osteoporosis.  She returns today after her last visit in July 2025.  She states on October 15 she fell down on her extended left hand.  According the patient she had closed nondisplaced fracture of her left radius.  She had a cast and then a brace.  She continues to be on Xeljanz  11 mg XR every day and methotrexate  4 tablets weekly along with folic acid .  She states she had 1 episode of some discomfort in her right knee joint which resolved after using some topical agents.  She had no recurrence of any other joint pain or swelling.  She states the pubic pain improved after she cut back on her exercises.  She has resumed exercises now.  She has noticed some discomfort after resuming exercise.  She continues to have dry mouth and dry eyes symptoms .  She gets mild Raynaud's symptoms when exposed to the colder temperatures.  She continues to be on alendronate  by her PCP.    Activities of Daily Living:  Patient reports morning stiffness for 2 hours.   Patient Reports nocturnal pain.  Difficulty dressing/grooming: Denies Difficulty climbing stairs: Reports Difficulty getting out of chair: Reports Difficulty using hands for taps, buttons, cutlery, and/or writing: Reports  Review of Systems  Constitutional:  Positive for fatigue.  HENT:  Positive for mouth dryness. Negative for mouth sores.   Eyes:  Positive for dryness.  Respiratory:  Negative for shortness of breath.   Cardiovascular:  Negative for chest pain and palpitations.  Gastrointestinal:  Positive for constipation. Negative for blood in stool and  diarrhea.  Endocrine: Negative for increased urination.  Genitourinary:  Negative for involuntary urination.  Musculoskeletal:  Positive for joint pain, joint pain, myalgias, morning stiffness and myalgias. Negative for gait problem, joint swelling, muscle weakness and muscle tenderness.  Skin:  Positive for sensitivity to sunlight. Negative for color change, rash and hair loss.  Allergic/Immunologic: Positive for susceptible to infections.  Neurological:  Negative for dizziness and headaches.  Hematological:  Negative for swollen glands.  Psychiatric/Behavioral:  Positive for sleep disturbance. Negative for depressed mood. The patient is not nervous/anxious.     PMFS History:  Patient Active Problem List   Diagnosis Date Noted   Osteoporosis 11/21/2022   Nasal congestion 10/18/2021   Nasal septal deviation 10/18/2021   Chronic bronchitis (HCC) 03/06/2021   Hoarseness 03/06/2021   Stress incontinence 12/06/2020   Rib pain on right side 04/13/2020   High risk HPV infection 12/10/2019   Mouth lesion 06/01/2019   Depression, recurrent 06/01/2019   Corneal irritation of both eyes 03/04/2019   Fibroid uterus 06/20/2018   Grade II internal hemorrhoids 06/20/2018   Mixed hyperlipidemia 03/14/2016   Environmental allergies 03/14/2016   Gastroesophageal reflux disease without esophagitis 02/24/2015   Rash 07/14/2014   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   Raynaud phenomenon 04/11/2012   ANEMIA, IRON DEFICIENCY 09/08/2009   SJOGREN'S SYNDROME 01/06/2008   Rheumatoid arthritis of multiple sites with negative rheumatoid factor (HCC) 04/09/2007  Past Medical History:  Diagnosis Date   Amnesia memory loss 10/14/2012   Anemia 2009   Arthritis    Dry eyes    GERD (gastroesophageal reflux disease)    History of glaucoma    s/p   bilateral eye laser treatment  2007   History of traumatic head injury 10/14/2012   Lupus (systemic lupus erythematosus) (HCC)    Osteoarthritis    Positive TB test     pt states cxr negative was tested due to treatment for rheumatoid arthritis since test +  , was told to take medication for tb for 9 months before going back on humira injection   Rheumatoid arthritis(714.0)    Dr. Helon, [facility]   Sjogren's disease    SUI (stress urinary incontinence, female)    Wears glasses     Family History  Problem Relation Age of Onset   CVA Mother        TIA   Hypertension Mother    Cystic fibrosis Mother    Stomach cancer Father    Diabetes Father    Hypertension Father    Hypertension Brother    Hypertension Brother    Healthy Daughter    Healthy Daughter    Past Surgical History:  Procedure Laterality Date   CARPAL TUNNEL RELEASE Bilateral 2007   CERVICAL CONIZATION W/BX  1994   CESAREAN SECTION  x2  last one 1999   GLAUCOMA SURGERY Bilateral 2007   laser   HYSTEROSCOPY WITH D & C  03-02-2010   MASS EXCISION Left 05/21/2013   Procedure: EXCISION MASS LEFT LONG FINGER;  Surgeon: Prentice LELON Pagan, MD;  Location: [facility];  Service: Orthopedics;  Laterality: Left;   NASAL SEPTOPLASTY W/ TURBINOPLASTY Bilateral 10/18/2021   Procedure: NASAL SEPTOPLASTY WITH TURBINATE REDUCTION AND REPAIR OF NASAL VALVE COLLAPSE;  Surgeon: Llewellyn Gerard LABOR, DO;  Location: [facility] OR;  Service: ENT;  Laterality: Bilateral;   SHOULDER ARTHROSCOPY WITH OPEN ROTATOR CUFF REPAIR Right 2012   TENOSYNOVECTOMY Left 05/21/2013   Procedure: LEFT INDEX FINGER TENOSYNOVECTOMY;  Surgeon: Prentice LELON Pagan, MD;  Location: [facility];  Service: Orthopedics;  Laterality: Left;   TRIGGER FINGER RELEASE  2009   left middle finger   TYMPANOPLASTY Left 1993   Social History   Tobacco Use   Smoking status: Never    Passive exposure: Past   Smokeless tobacco: Never  Vaping Use   Vaping status: Never Used  Substance Use Topics   Alcohol use: Yes    Comment: wine or cocktail occ   Drug use: No   Social History   Social History  Narrative   Lives with Jenna Gordon 87yo, Daughter, Jenna Gordon 21yo, Pets [place] and [place].      Immunization History  Administered Date(s) Administered   INFLUENZA, HIGH DOSE SEASONAL PF 09/23/2014   Influenza Split 10/02/2011   Influenza Whole 11/05/2008, 10/24/2009   Influenza,inj,Quad PF,6+ Mos 10/14/2012, 10/15/2016, 09/24/2017, 10/23/2018, 10/06/2020, 09/28/2021   Influenza-Unspecified 10/02/2011, 10/14/2012, 11/23/2015, 10/15/2016, 09/24/2017, 10/07/2019, 09/23/2022   MMR 07/19/2023   Moderna Covid-19 Fall Seasonal Vaccine 48yrs & older 10/23/2023   PFIZER(Purple Top)SARS-COV-2 Vaccination 03/16/2019, 04/06/2019, 09/08/2019, 03/10/2020   PPD Test 02/11/2013   Pfizer Covid-19 Vaccine Bivalent Booster 41yrs & up 12/06/2020, 10/22/2021   Pfizer(Comirnaty)Fall Seasonal Vaccine 12 years and older 09/23/2022   Pneumococcal Conjugate-13 07/27/2016   Pneumococcal Polysaccharide-23 12/06/2020   Td 08/06/2002   Tdap 02/06/2019   Zoster Recombinant(Shingrix) 04/13/2020, 06/13/2020     Objective:  Vital Signs: BP 114/80   Pulse (!) 59   Temp 97.6 F (36.4 C)   Resp 14   Ht 5' 3 (1.6 m)   Wt 164 lb 6.4 oz (74.6 kg)   LMP 05/05/2014   BMI 29.12 kg/m    Physical Exam Vitals and nursing note reviewed.  Constitutional:      Appearance: She is well-developed.  HENT:     Head: Normocephalic and atraumatic.  Eyes:     Conjunctiva/sclera: Conjunctivae normal.  Cardiovascular:     Rate and Rhythm: Normal rate and regular rhythm.     Heart sounds: Normal heart sounds.  Pulmonary:     Effort: Pulmonary effort is normal.     Breath sounds: Normal breath sounds.  Abdominal:     General: Bowel sounds are normal.     Palpations: Abdomen is soft.  Musculoskeletal:     Cervical back: Normal range of motion.  Lymphadenopathy:     Cervical: No cervical adenopathy.  Skin:    General: Skin is warm and dry.     Capillary Refill: Capillary refill takes less than 2 seconds.  Neurological:      Mental Status: She is alert and oriented to person, place, and time.  Psychiatric:        Behavior: Behavior normal.      Musculoskeletal Exam: Cervical, thoracic and lumbar spine Juengel range of motion.  Shoulders and elbows were in good range of motion.  Thickening of bilateral wrist joints, MCPs and PIPs was noted.  Ulnar deviation of most of the MCPs was noted.  No synovitis was noted.  Hip joints and knee joints in good range of motion without any warmth swelling or effusion.  There was no tenderness over ankles or MTPs.  CDAI Exam: CDAI Score: -- Patient Global: 30 / 100; Provider Global: 10 / 100 Swollen: --; Tender: -- Joint Exam 01/02/2024   No joint exam has been documented for this visit   There is currently no information documented on the homunculus. Go to the Rheumatology activity and complete the homunculus joint exam.  Investigation: No additional findings.  Imaging: No results found.  Recent Labs: Lab Results  Component Value Date   WBC 5.4 11/19/2023   HGB 14.7 11/19/2023   PLT 225 11/19/2023   NA 140 11/19/2023   K 4.2 11/19/2023   CL 104 11/19/2023   CO2 28 11/19/2023   GLUCOSE 95 11/19/2023   BUN 14 11/19/2023   CREATININE 0.76 11/19/2023   BILITOT 0.5 11/19/2023   ALKPHOS 73 04/11/2012   AST 43 (H) 11/19/2023   ALT 55 (H) 11/19/2023   PROT 7.9 11/19/2023   ALBUMIN 4.1 04/11/2012   CALCIUM 9.8 11/19/2023   GFRAA 115 03/22/2020   QFTBGOLDPLUS NEGATIVE 04/16/2023    Speciality Comments: FYI patient will need Xeljanz  samples for the rest of 2024. She is aware to call ahead of time. Fosamax  10/24  Procedures:  No procedures performed Allergies: Cefzil [cefprozil], Macrobid [nitrofurantoin], and Sulfa antibiotics   Assessment / Plan:     Visit Diagnoses: Rheumatoid arthritis involving multiple sites with positive rheumatoid factor (HCC) - +RF,+anti-CCP, +14-3-3n, Diagnosed in 2006: Patient denies having a severe flare since the last visit.  She  states she had 1 day of discomfort in her right knee joint which resolved after using some topical agent.  No synovitis was noted on the examination today.  High risk medication use - Xeljanz  11 mg XR by mouth daily, methotrexate  4 tablets by mouth  once weekly and folic acid  2 mg daily. -Her LFTs were elevated in October 2025 which she relates to taking NSAIDs after the left radius fracture.  Will repeat LFTs today.  Plan: AST, ALT.  She was advised to get repeat labs in January which would include CBC and CMP.  TB Gold will be due in March 2026.  Annual skin examination to screen for skin cancer was advised while she is on Xeljanz .  Information regarding radiation was placed in the AVS.  She was advised to hold Xeljanz  and methotrexate  if she develops an infection resume after the infection resolves.  Mixed hyperlipidemia -will repeat lipid panel today.  Plan: Lipid panel  Pain in both hands -she has severe rheumatoid arthritis and osteoarthritis overlap with bilateral ulnar deviation and synovial thickening.  No active synovitis was noted on the examination today.    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  Chondromalacia of both patellae-currently not symptomatic.  She had 1 episode of increased discomfort in her right knee joint which resolved after using the topical agents.  Pain in both feet -discomfort today.  Juxta-articular osteopenia.  Osteoarthritic changes were also noted.  No radiographic progression was noted when compared to the x-rays of 2021  Pelvic pain -she has intermittent discomfort in the pubic region when she works out.  She states the pain improved after stopping intensive workout.  X-rays obtained in the past of the pelvis were unremarkable.  Sicca syndrome-continues to have dry mouth and dry eye symptoms.  Over-the-counter products were discussed.  Raynaud's phenomenon without gangrene-she has mild symptoms.  Keeping core temperature warm and warm clothing  was discussed.  Age-related osteoporosis without current pathological fracture - November 20, 2022 DEXA scan:The BMD measured at [facility] Spine L1-L2 is 0.830 g/cm2 with a T-score of-2.8.  Patient started taking Fosamax  70 mg p.o. weekly by her PCP  Chronic laryngitis - patient had extensive workup by the ENT in the past.  Gastroesophageal reflux disease without esophagitis  Mild episode of recurrent major depressive disorder  Orders: Orders Placed This Encounter  Procedures   AST   ALT   Lipid panel   No orders of the defined types were placed in this encounter.    Follow-Up Instructions: Return in about 5 months (around 06/01/2024) for Rheumatoid arthritis.   Maya Nash, MD  Note - This record has been created using Animal nutritionist.  Chart creation errors have been sought, but may not always  have been located. Such creation errors do not reflect on  the standard of medical care.

## 2024-01-01 ENCOUNTER — Ambulatory Visit
Admission: RE | Admit: 2024-01-01 | Discharge: 2024-01-01 | Disposition: A | Discharge status: Home / Self Care | Source: Ambulatory Visit | Attending: Family Medicine | Admitting: Family Medicine

## 2024-01-01 ENCOUNTER — Other Ambulatory Visit: Payer: Self-pay | Admitting: Family Medicine

## 2024-01-01 DIAGNOSIS — Z1231 Encounter for screening mammogram for malignant neoplasm of breast: Secondary | ICD-10-CM

## 2024-01-02 ENCOUNTER — Ambulatory Visit: Attending: Rheumatology | Admitting: Rheumatology

## 2024-01-02 ENCOUNTER — Encounter: Payer: Self-pay | Admitting: Rheumatology

## 2024-01-02 VITALS — BP 114/80 | HR 59 | Temp 97.6°F | Resp 14 | Ht 63.0 in | Wt 164.4 lb

## 2024-01-02 DIAGNOSIS — J370 Chronic laryngitis: Secondary | ICD-10-CM

## 2024-01-02 DIAGNOSIS — E782 Mixed hyperlipidemia: Secondary | ICD-10-CM | POA: Diagnosis not present

## 2024-01-02 DIAGNOSIS — M0579 Rheumatoid arthritis with rheumatoid factor of multiple sites without organ or systems involvement: Secondary | ICD-10-CM

## 2024-01-02 DIAGNOSIS — R102 Pelvic and perineal pain unspecified side: Secondary | ICD-10-CM | POA: Diagnosis not present

## 2024-01-02 DIAGNOSIS — M0630 Rheumatoid nodule, unspecified site: Secondary | ICD-10-CM

## 2024-01-02 DIAGNOSIS — M79641 Pain in right hand: Secondary | ICD-10-CM | POA: Diagnosis not present

## 2024-01-02 DIAGNOSIS — M2241 Chondromalacia patellae, right knee: Secondary | ICD-10-CM

## 2024-01-02 DIAGNOSIS — M810 Age-related osteoporosis without current pathological fracture: Secondary | ICD-10-CM

## 2024-01-02 DIAGNOSIS — M79642 Pain in left hand: Secondary | ICD-10-CM

## 2024-01-02 DIAGNOSIS — Z79899 Other long term (current) drug therapy: Secondary | ICD-10-CM

## 2024-01-02 DIAGNOSIS — I7300 Raynaud's syndrome without gangrene: Secondary | ICD-10-CM

## 2024-01-02 DIAGNOSIS — M79671 Pain in right foot: Secondary | ICD-10-CM

## 2024-01-02 DIAGNOSIS — M79672 Pain in left foot: Secondary | ICD-10-CM

## 2024-01-02 DIAGNOSIS — M3500 Sicca syndrome, unspecified: Secondary | ICD-10-CM

## 2024-01-02 DIAGNOSIS — K219 Gastro-esophageal reflux disease without esophagitis: Secondary | ICD-10-CM

## 2024-01-02 DIAGNOSIS — M2242 Chondromalacia patellae, left knee: Secondary | ICD-10-CM

## 2024-01-02 DIAGNOSIS — F330 Major depressive disorder, recurrent, mild: Secondary | ICD-10-CM

## 2024-01-02 NOTE — Patient Instructions (Signed)
 Standing Labs We placed an order today for your standing lab work.   Please have your standing labs drawn in  January and every 3 months  Please have your labs drawn 2 weeks prior to your appointment so that the provider can discuss your lab results at your appointment, if possible.  Please note that you may see your imaging and lab results in MyChart before we have reviewed them. We will contact you once all results are reviewed. Please allow our office up to 72 hours to thoroughly review all of the results before contacting the office for clarification of your results.  WALK-IN LAB HOURS  Monday through Thursday from 8:00 am - 4:30 pm and Friday from 8:00 am-12:00 pm.  Patients with office visits requiring labs will be seen before walk-in labs.  You may encounter longer than normal wait times. Please allow additional time. Wait times may be shorter on  Monday and Thursday afternoons.  We do not book appointments for walk-in labs. We appreciate your patience and understanding with our staff.   Labs are drawn by [facility]. Please bring your co-pay at the time of your lab draw.  You may receive a bill from [facility] for your lab work.  Please note if you are on Hydroxychloroquine and and an order has been placed for a Hydroxychloroquine level,  you will need to have it drawn 4 hours or more after your last dose.  If you wish to have your labs drawn at another location, please call the office 24 hours in advance so we can fax the orders.  The office is located at [address], [address], [place], [place] [address]   If you have any questions regarding directions or hours of operation,  please call [phone].   As a reminder, please drink plenty of water prior to coming for your lab work. Thanks!   Vaccines You are taking a medication(s) that can suppress your immune system.  The following immunizations are recommended: Flu annually Covid-19  RSV Td/Tdap (tetanus, diphtheria, pertussis)  every 10 years Pneumonia (Prevnar 15 then Pneumovax 23 at least 1 year apart.  Alternatively, can take Prevnar 20 without needing additional dose) Shingrix: 2 doses from 4 weeks to 6 months apart  Please check with your PCP to make sure you are up to date.   If you have signs or symptoms of an infection or start antibiotics: First, call your PCP for workup of your infection. Hold your medication through the infection, until you complete your antibiotics, and until symptoms resolve if you take the following: Injectable medication (Actemra, Benlysta, Cimzia, Cosentyx, Enbrel, Humira, Kevzara, Orencia, Remicade, Simponi, Stelara, Taltz, Tremfya) Methotrexate  Leflunomide (Arava) Mycophenolate (Cellcept) Xeljanz , Olumiant, or Rinvoq   Please get an annual skin examination to screen for skin cancer while you are on Xeljanz .  Please use sunscreen and sun protection.

## 2024-01-03 ENCOUNTER — Ambulatory Visit: Payer: Self-pay | Admitting: Rheumatology

## 2024-01-03 LAB — LIPID PANEL
Cholesterol: 203 mg/dL — ABNORMAL HIGH (ref <200)
HDL: 73 mg/dL (ref >=50)
LDL Cholesterol (Calc): 99 mg/dL
Non-HDL Cholesterol (Calc): 130 mg/dL — ABNORMAL HIGH (ref <130)
Total CHOL/HDL Ratio: 2.8 (calc) (ref <5.0)
Triglycerides: 184 mg/dL — ABNORMAL HIGH (ref <150)

## 2024-01-03 LAB — ALT: ALT: 22 U/L (ref 6–29)

## 2024-01-03 LAB — AST: AST: 20 U/L (ref 10–35)

## 2024-01-03 NOTE — Progress Notes (Signed)
 Triglycerides elevated, AST and ALT normal.  Please forward results to her PCP.

## 2024-01-12 ENCOUNTER — Other Ambulatory Visit: Payer: Self-pay | Admitting: Physician Assistant

## 2024-01-12 ENCOUNTER — Other Ambulatory Visit: Payer: Self-pay | Admitting: Family Medicine

## 2024-01-13 NOTE — Telephone Encounter (Signed)
 Last Fill: 03/19/2023  Next Visit: 06/04/2024  Last Visit: 01/02/2024  Dx: Rheumatoid arthritis involving multiple sites with positive rheumatoid factor (HCC)   Current Dose per office note on 01/02/2024:folic acid  2 mg daily.   Okay to refill Folic Acid ?

## 2024-06-04 ENCOUNTER — Ambulatory Visit: Admitting: Rheumatology
